# Patient Record
Sex: Male | Born: 1980 | Race: Black or African American | Hispanic: No | Smoking: Never smoker
Health system: Southern US, Community
[De-identification: ages and names within clinical notes are randomized; demographics above are authoritative.]

## PROBLEM LIST (undated history)

## (undated) ENCOUNTER — Emergency Department (HOSPITAL_COMMUNITY): Discharge status: Absent without leave | Payer: Self-pay

## (undated) DIAGNOSIS — M549 Dorsalgia, unspecified: Secondary | ICD-10-CM

## (undated) DIAGNOSIS — K5900 Constipation, unspecified: Secondary | ICD-10-CM

## (undated) DIAGNOSIS — Z8719 Personal history of other diseases of the digestive system: Secondary | ICD-10-CM

## (undated) HISTORY — PX: OTHER SURGICAL HISTORY: SHX169

## (undated) HISTORY — DX: Dorsalgia, unspecified: M54.9

## (undated) HISTORY — PX: HERNIA REPAIR: SHX51

## (undated) HISTORY — DX: Constipation, unspecified: K59.00

---

## 2020-09-16 ENCOUNTER — Other Ambulatory Visit: Payer: Self-pay

## 2020-09-16 ENCOUNTER — Emergency Department (HOSPITAL_COMMUNITY): Payer: Self-pay

## 2020-09-16 ENCOUNTER — Emergency Department (HOSPITAL_COMMUNITY)
Admission: EM | Admit: 2020-09-16 | Discharge: 2020-09-17 | Disposition: A | Discharge status: Home / Self Care | Payer: Self-pay | Attending: Emergency Medicine | Admitting: Emergency Medicine

## 2020-09-16 ENCOUNTER — Encounter (HOSPITAL_COMMUNITY): Payer: Self-pay | Admitting: Emergency Medicine

## 2020-09-16 DIAGNOSIS — Y9366 Activity, soccer: Secondary | ICD-10-CM | POA: Insufficient documentation

## 2020-09-16 DIAGNOSIS — M5442 Lumbago with sciatica, left side: Secondary | ICD-10-CM | POA: Insufficient documentation

## 2020-09-16 IMAGING — CR DG HIP (WITH OR WITHOUT PELVIS) 2-3V*L*
3 series · 3 of 3 positions shown · non-contrast
Comparison: None.

CLINICAL DATA: 39-year-old male with left hip pain.

EXAM:
DG HIP (WITH OR WITHOUT PELVIS) 2-3V LEFT

[pelvis ap]
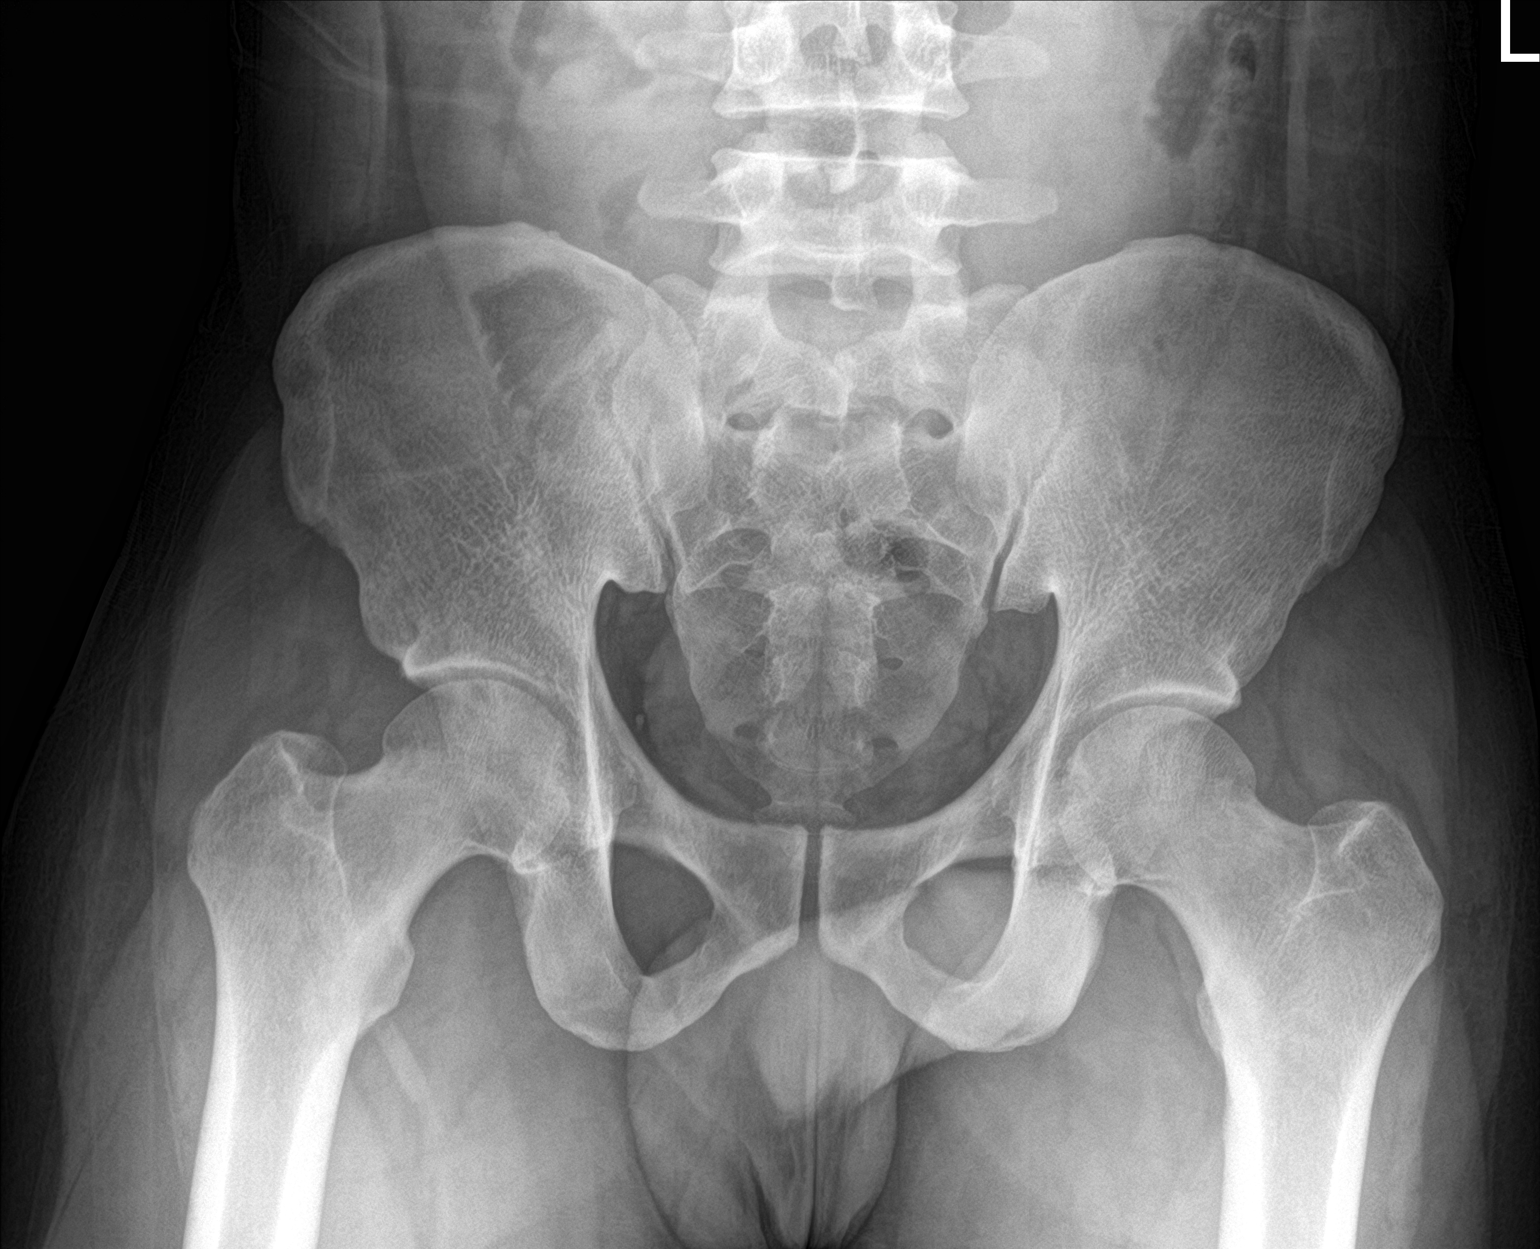

[hip ap]
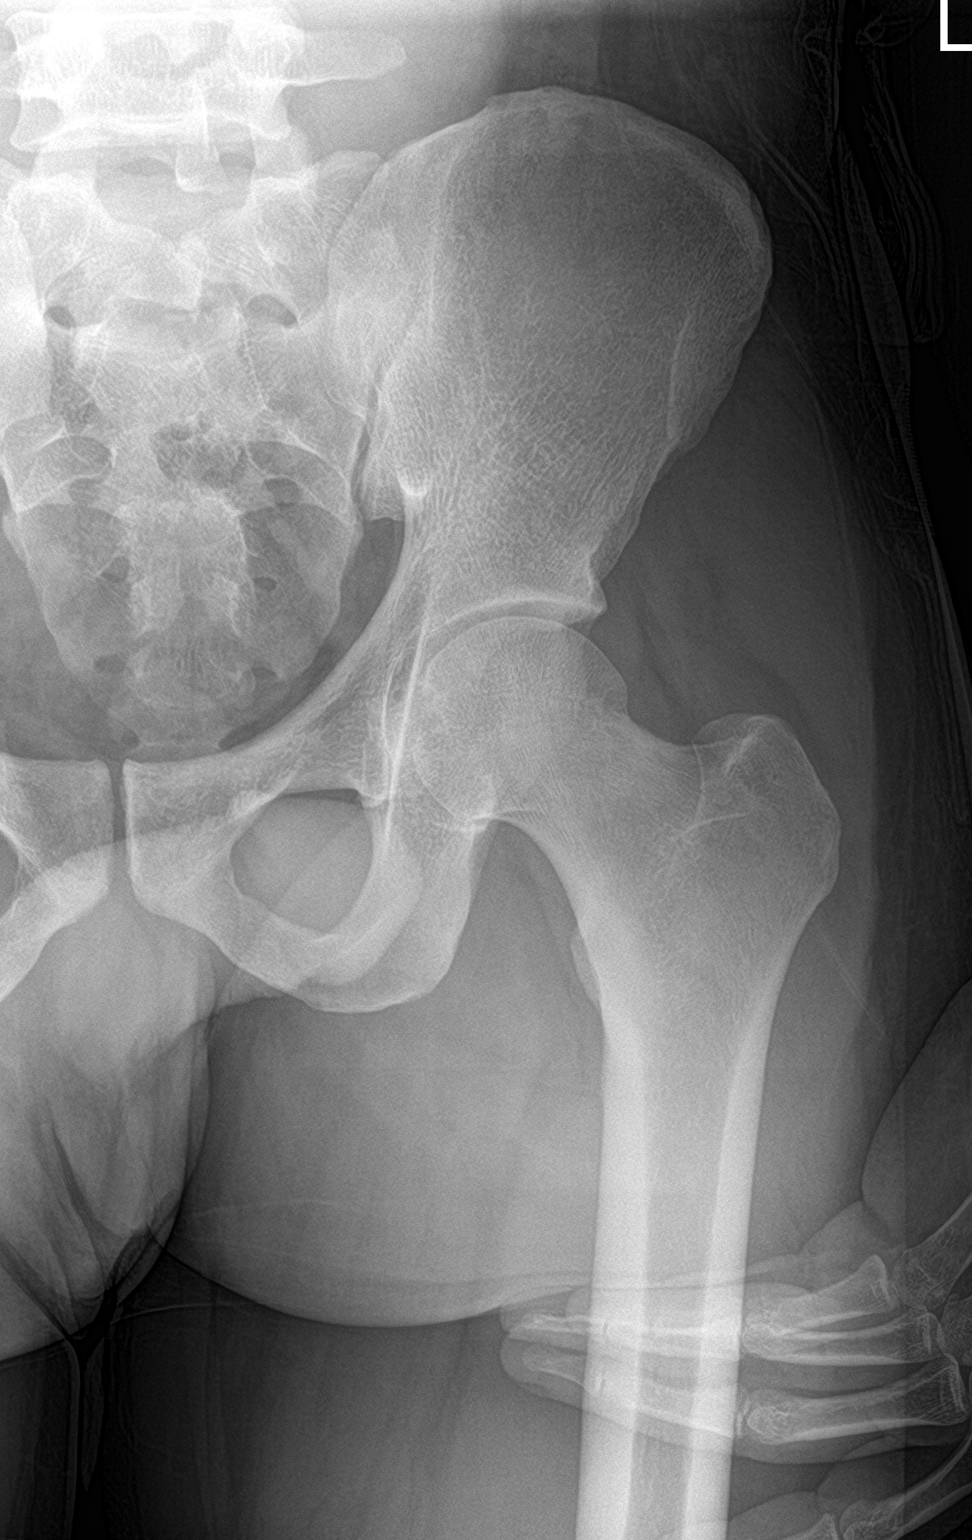

[hip lat]
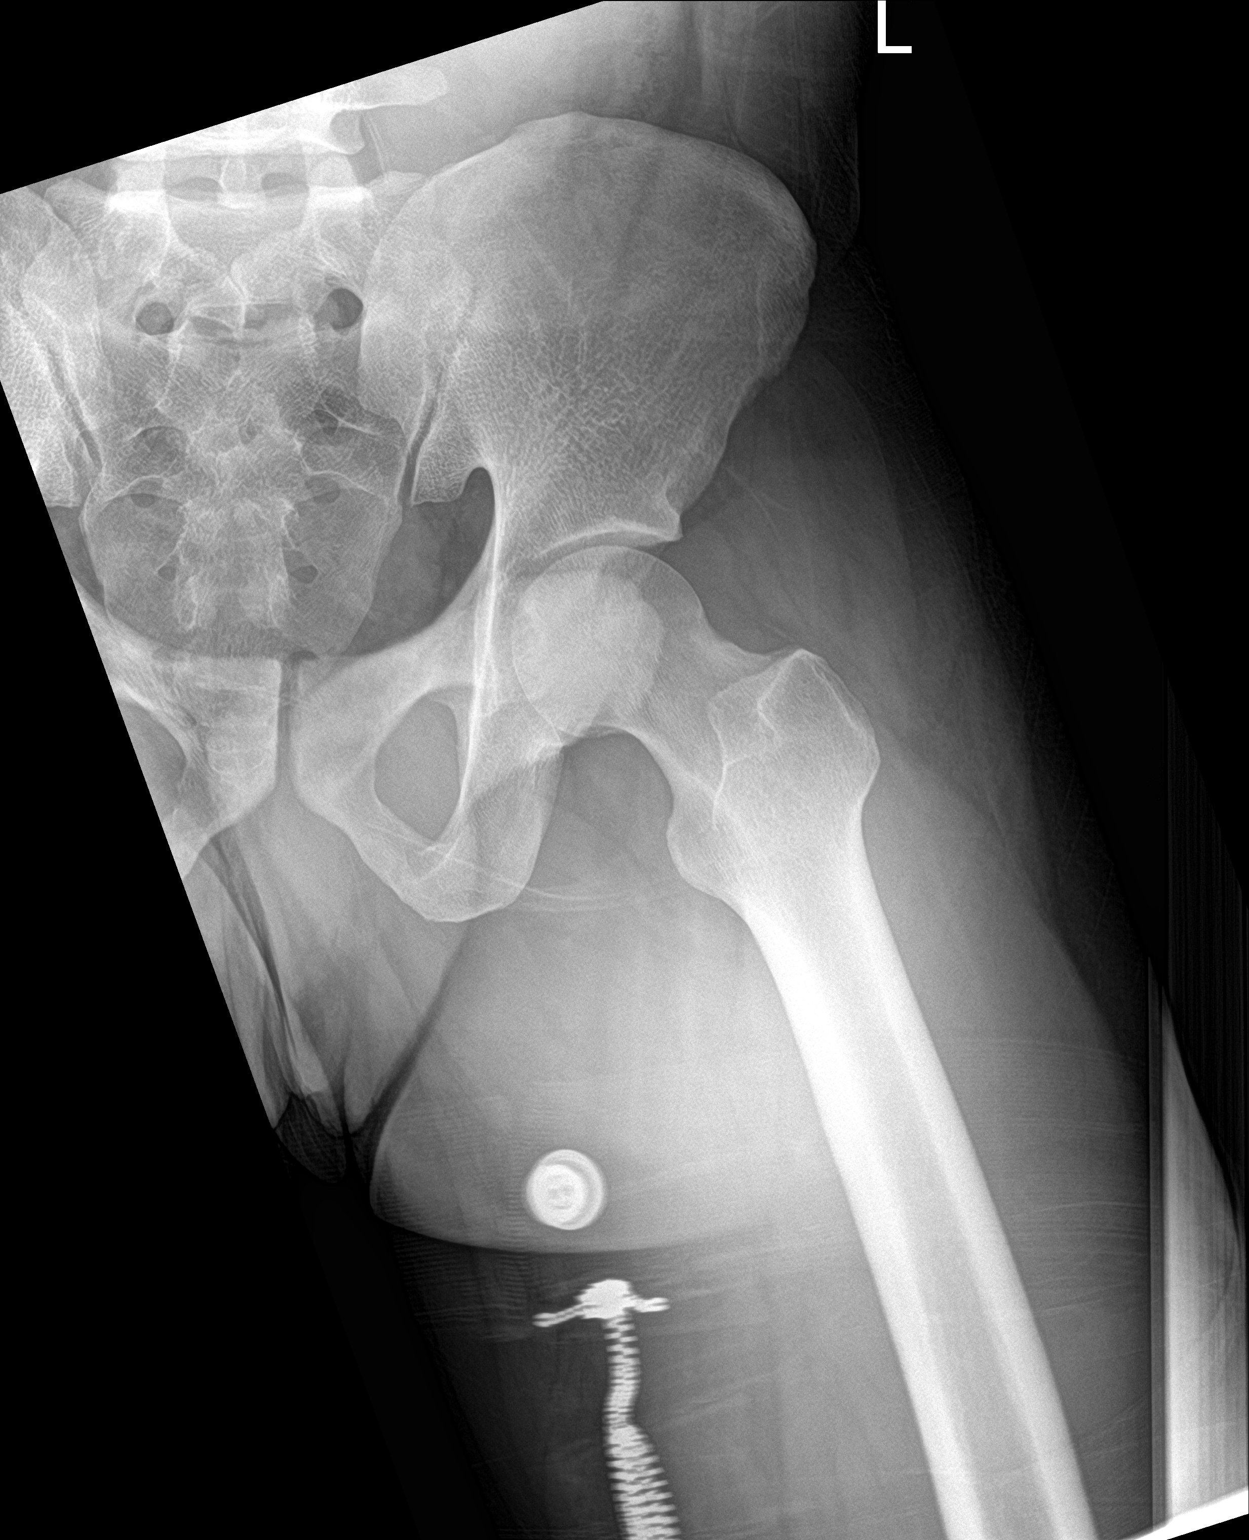

[3 of 3 positions shown; findings below may reference images not displayed]

FINDINGS: There is no evidence of hip fracture or dislocation. There is no
evidence of arthropathy or other focal bone abnormality.
IMPRESSION: Negative.

## 2020-09-16 MED ORDER — ACETAMINOPHEN 500 MG PO TABS
1000.0000 mg | ORAL_TABLET | Freq: Once | ORAL | Status: AC
  Administered 2020-09-16: 1000 mg via ORAL
  Filled 2020-09-16: qty 2

## 2020-09-16 NOTE — ED Triage Notes (Signed)
Triage conducted through french interpretor.  Pt c/o L leg pain after playing soccer January 1st, pt reports pain to L hip since that time, using OTC creams, now having cramping/pain to L leg.

## 2020-09-17 LAB — CK: Total CK: 136 U/L (ref 49–397)

## 2020-09-17 MED ORDER — LIDOCAINE 5 % EX PTCH: 1.0000 | MEDICATED_PATCH | CUTANEOUS | 0 refills | Status: DC

## 2020-09-17 MED ORDER — HYDROCODONE-ACETAMINOPHEN 5-325 MG PO TABS: 1.0000 | ORAL_TABLET | ORAL | 0 refills | Status: DC | PRN

## 2020-09-17 MED ORDER — PREDNISONE 50 MG PO TABS: 50.0000 mg | ORAL_TABLET | Freq: Every day | ORAL | 0 refills | Status: AC

## 2020-09-17 MED ORDER — OXYCODONE-ACETAMINOPHEN 5-325 MG PO TABS
1.0000 | ORAL_TABLET | ORAL | Status: DC | PRN
  Administered 2020-09-17: 1 via ORAL
  Filled 2020-09-17: qty 1

## 2020-09-17 MED ORDER — METHOCARBAMOL 500 MG PO TABS: 500.0000 mg | ORAL_TABLET | Freq: Two times a day (BID) | ORAL | 0 refills | Status: DC

## 2020-09-17 NOTE — ED Provider Notes (Signed)
[facility] EMERGENCY DEPARTMENT Provider Note   CSN: [id number] Arrival date & time: 09/16/20  2222     History Chief Complaint  Patient presents with  . Leg Pain    Isaac Fletcher is a 40 y.o. male with no significant past medical history who presents for evaluation of left lower back pain.  This began 10 days ago.  States pain starts at his left lower back and extends into the lateral portion of his left leg.  Worse with movement, bending and twisting.  States this started after he played soccer.  Has been taking ibuprofen at home without relief.  States he also intermittently gets spasms to his left lower back and into his left gluteal region.  Rates his current pain a 5/10.  He is able to walk.  States intermittently he gets a sharp, stabbing pain that begins in his left gluteal region will get "tingling" which resolves.  He denies any history of IV drug use, bowel or bladder incontinence, saddle paresthesias.  He denies any lateral leg swelling, redness or warmth.  Is not followed by orthopedics.  Denies fever, chills, nausea, vomiting, chest pain, shortness of breath abdominal pain, diarrhea, dysuria.  Denies additional aggravating or relieving factors.  History obtained from patient and past medical records.  No interpreter used.  HPI     History reviewed. No pertinent past medical history.  There are no problems to display for this patient.   History reviewed. No pertinent surgical history.     No family history on file.  Social History   Tobacco Use  . Smoking status: Never Smoker  . Smokeless tobacco: Never Used  Substance Use Topics  . Alcohol use: Never  . Drug use: Never    Home Medications Prior to Admission medications   Medication Sig Start Date End Date Taking? Authorizing Provider  HYDROcodone-acetaminophen (NORCO/VICODIN) 5-325 MG tablet Take 1 tablet by mouth every 4 (four) hours as needed. 09/17/20  Yes Lamontae Ricardo A, [name]   lidocaine (LIDODERM) 5 % Place 1 patch onto the skin daily. Remove & Discard patch within 12 hours or as directed by MD 09/17/20  Yes Virgal Warmuth A, [name]  methocarbamol (ROBAXIN) 500 MG tablet Take 1 tablet (500 mg total) by mouth 2 (two) times daily. 09/17/20  Yes Ules Marsala A, [name]  predniSONE (DELTASONE) 50 MG tablet Take 1 tablet (50 mg total) by mouth daily for 4 days. 09/17/20 09/21/20 Yes Jazmarie Biever A, [name]    Allergies    Patient has no known allergies.  Review of Systems   Review of Systems  Constitutional: Negative.   HENT: Negative.   Respiratory: Negative.   Cardiovascular: Negative.   Gastrointestinal: Negative.   Genitourinary: Negative.   Musculoskeletal: Positive for back pain. Negative for arthralgias, gait problem, joint swelling, myalgias, neck pain and neck stiffness.  Skin: Negative.   Neurological: Negative.   All other systems reviewed and are negative.   Physical Exam Updated Vital Signs BP (!) 126/93 (BP Location: Right Arm)   Pulse 77   Temp 97.8 F (36.6 C) (Oral)   Resp 16   Wt 89 kg   SpO2 100%   Physical Exam Physical Exam  Constitutional: Pt appears well-developed and well-nourished. No distress.  HENT:  Head: Normocephalic and atraumatic.  Mouth/Throat: Oropharynx is clear and moist. No oropharyngeal exudate.  Eyes: Conjunctivae are normal.  Neck: Normal range of motion. Neck supple.  Full ROM without pain  Cardiovascular: Normal rate, regular  rhythm and intact distal pulses.   Pulmonary/Chest: Effort normal and breath sounds normal. No respiratory distress. Pt has no wheezes.  Abdominal: Soft. Pt exhibits no distension. There is no tenderness, rebound or guarding. No abd bruit or pulsatile mass Musculoskeletal:  Full range of motion of the T-spine and L-spine with flexion, hyperextension, and lateral flexion. No midline tenderness or stepoffs. No tenderness to palpation of the spinous processes of the T-spine or  L-spine. Moderate tenderness to palpation of the paraspinous muscles of the L-spine over SI and Piriformis. Compartments soft, Lymphadenopathy:    Pt has no cervical adenopathy.  Neurological: Pt is alert. Pt has normal reflexes.  Reflex Scores:      Bicep reflexes are 2+ on the right side and 2+ on the left side.      Brachioradialis reflexes are 2+ on the right side and 2+ on the left side.      Patellar reflexes are 2+ on the right side and 2+ on the left side.      Achilles reflexes are 2+ on the right side and 2+ on the left side. Speech is clear and goal oriented, follows commands Normal 5/5 strength in upper and lower extremities bilaterally including dorsiflexion and plantar flexion, strong and equal grip strength Sensation normal to light and sharp touch Moves extremities without ataxia, coordination intact Normal gait Normal balance No Clonus Skin: Skin is warm and dry. No rash noted or lesions noted. Pt is not diaphoretic. No erythema, ecchymosis,edema or warmth.  Psychiatric: Pt has a normal mood and affect. Behavior is normal.  Nursing note and vitals reviewed. ED Results / Procedures / Treatments   Labs (all labs ordered are listed, but only abnormal results are displayed) Labs Reviewed  CK    EKG None  Radiology DG Hip Unilat W or Wo Pelvis 2-3 Views Left  Result Date: 09/16/2020 CLINICAL DATA:  40 year old male with left hip pain. EXAM: DG HIP (WITH OR WITHOUT PELVIS) 2-3V LEFT COMPARISON:  None. FINDINGS: There is no evidence of hip fracture or dislocation. There is no evidence of arthropathy or other focal bone abnormality. IMPRESSION: Negative. Electronically Signed   By: Elgie Collard M.D.   On: 09/16/2020 23:30   Procedures Procedures (including critical care time)  Medications Ordered in ED Medications  oxyCODONE-acetaminophen (PERCOCET/ROXICET) 5-325 MG per tablet 1 tablet (1 tablet Oral Given 09/17/20 0132)  acetaminophen (TYLENOL) tablet 1,000 mg  (1,000 mg Oral Given 09/16/20 2310)   ED Course  I have reviewed the triage vital signs and the nursing notes.  Pertinent labs & imaging results that were available during my care of the patient were reviewed by me and considered in my medical decision making (see chart for details).  Unfortunately patient had extended wait time of greater than 11 hours in the lobby.  40 year old presents for evaluation of left lower back pain after playing soccer.  Pain radiates from the left lower back into his gluteal region into the lateral aspect of his right leg.  Terminates at the knee.  Able to reproduce pain on exam.  He has no midline spinal tenderness, step-offs.  Denies history of IV drug use, bowel or bladder incontinence, saddle paresthesia.  No prior history of back problems.  His compartments are soft.  Low suspicion for compartment syndrome.  He has no unilateral leg swelling, redness or warmth.  Low suspicion for VTE.  Did have labs and imaging performed by triage prior to my assessment.  CK level within normal limits,  low suspicion for rhabdomyolysis, I did obtain an x-ray of his left hip which does not show any evidence of fracture or dislocation.  I feel patient symptoms are likely due to sciatica.  At this time I have low suspicion for acute neurosurgical emergency such as cauda equina, discitis, osteomyelitis, transverse myelitis, psoas abscess, intra-abdominal process, vascular occlusion.  Patient's pain was treated from waiting room.  Has had significant improvement.  Ambulatory without difficulty.  DC home with prescriptions.  He will follow-up outpatient with orthopedics.  He is agreeable to this.  The patient has been appropriately medically screened and/or stabilized in the ED. I have low suspicion for any other emergent medical condition which would require further screening, evaluation or treatment in the ED or require inpatient management.  Patient is hemodynamically stable and in no acute  distress.  Patient able to ambulate in department prior to ED.  Evaluation does not show acute pathology that would require ongoing or additional emergent interventions while in the emergency department or further inpatient treatment.  I have discussed the diagnosis with the patient and answered all questions.  Pain is been managed while in the emergency department and patient has no further complaints prior to discharge.  Patient is comfortable with plan discussed in room and is stable for discharge at this time.  I have discussed strict return precautions for returning to the emergency department.  Patient was encouraged to follow-up with PCP/specialist refer to at discharge.    MDM Rules/Calculators/A&P                           Final Clinical Impression(s) / ED Diagnoses Final diagnoses:  Acute left-sided low back pain with left-sided sciatica    Rx / DC Orders ED Discharge Orders         Ordered    HYDROcodone-acetaminophen (NORCO/VICODIN) 5-325 MG tablet  Every 4 hours PRN        09/17/20 1011    methocarbamol (ROBAXIN) 500 MG tablet  2 times daily        09/17/20 1011    lidocaine (LIDODERM) 5 %  Every 24 hours        09/17/20 1011    predniSONE (DELTASONE) 50 MG tablet  Daily        09/17/20 1011           Krystal Teachey A, [name] 09/17/20 1026    Linwood Dibbles, MD 09/18/20 0945

## 2020-09-17 NOTE — ED Notes (Signed)
Pt groaning and grimacing in pain. States no relief with earlier tylenol. Will medicate patient per protocol

## 2020-09-17 NOTE — ED Notes (Signed)
Pt states has numbness and tingling in left hip and leg, but able to walk. Pt states it is painful to walk on left leg.

## 2020-09-17 NOTE — Discharge Instructions (Signed)
Votre douleur est probablement le rsultat d'une maladie appele sciatique.  Je t'ai crit pour les mdicaments : Norco-Pour la douleur, ne conduisez pas et n'utilisez pas de machinerie lourde pendant que vous prenez ce mdicament. Peut devenir addictif.  Robaxin - Pour les spasmes musculaires  prednisone pour l'inflammation.  Suivi avec le mdecin orthopdiste si les symptmes ne sont pas rsolus. Son numro de tlphone est indiqu sur vos documents de dcharge.   Retour au service des urgences pour de nouveaux symptmes ou une aggravation des symptmes

## 2020-09-20 ENCOUNTER — Emergency Department (HOSPITAL_COMMUNITY)
Admission: EM | Admit: 2020-09-20 | Discharge: 2020-09-21 | Disposition: A | Discharge status: Home / Self Care | Payer: Self-pay | Attending: Emergency Medicine | Admitting: Emergency Medicine

## 2020-09-20 ENCOUNTER — Encounter (HOSPITAL_COMMUNITY): Payer: Self-pay

## 2020-09-20 ENCOUNTER — Other Ambulatory Visit: Payer: Self-pay

## 2020-09-20 DIAGNOSIS — M5432 Sciatica, left side: Secondary | ICD-10-CM | POA: Insufficient documentation

## 2020-09-20 DIAGNOSIS — Z5321 Procedure and treatment not carried out due to patient leaving prior to being seen by health care provider: Secondary | ICD-10-CM | POA: Insufficient documentation

## 2020-09-20 NOTE — ED Triage Notes (Signed)
[place] interpreter used for triage: Pt seen here on 1/11 for left sided sciatica pain, given rx in which he is out of, requesting more pain medications, did not call ortho to follow up. Pt ambulatory

## 2020-09-21 NOTE — ED Notes (Addendum)
Pt checked out AMA. 

## 2020-10-07 ENCOUNTER — Ambulatory Visit (INDEPENDENT_AMBULATORY_CARE_PROVIDER_SITE_OTHER): Payer: Self-pay

## 2020-10-07 ENCOUNTER — Other Ambulatory Visit: Payer: Self-pay

## 2020-10-07 ENCOUNTER — Encounter: Payer: Self-pay | Admitting: Orthopaedic Surgery

## 2020-10-07 ENCOUNTER — Ambulatory Visit: Payer: Self-pay | Admitting: Orthopaedic Surgery

## 2020-10-07 DIAGNOSIS — M5442 Lumbago with sciatica, left side: Secondary | ICD-10-CM

## 2020-10-07 MED ORDER — DICLOFENAC SODIUM 75 MG PO TBEC: 75.0000 mg | DELAYED_RELEASE_TABLET | Freq: Two times a day (BID) | ORAL | 1 refills | Status: DC | PRN

## 2020-10-07 MED ORDER — METHYLPREDNISOLONE 4 MG PO TABS: ORAL_TABLET | ORAL | 0 refills | Status: DC

## 2020-10-07 MED ORDER — GABAPENTIN 300 MG PO CAPS: 300.0000 mg | ORAL_CAPSULE | Freq: Every day | ORAL | 1 refills | Status: DC

## 2020-10-07 NOTE — Progress Notes (Signed)
Office Visit Note   Patient: Isaac Fletcher           Date of Birth: May 19, 1981           MRN: [id number] Visit Date: 10/07/2020              Requested by: No referring provider defined for this encounter. PCP: Patient, No Pcp Per   Assessment & Plan: Visit Diagnoses:  1. Acute left-sided low back pain with left-sided sciatica     Plan: What he describes is most likely an inflammatory process and it could be nerve compression or herniated disc.  I would like to try a 6-day steroid taper as well as Neurontin at bedtime and diclofenac twice a day.  Through the interpreter I explained why we will try these medications.  He does not have any health insurance as of now so may be difficult to get him set up for physical therapy or even an MRI.  Given the fact that there is no significant weakness, we should try medical course of treatment first with also trying back extension exercises and reevaluate him in 2 weeks to see how he is doing overall.  He agrees with this treatment plan.  All questions and concerns were answered through the interpreter.  We will reevaluate him in 2 weeks but no x-rays are needed.  Follow-Up Instructions: Return in about 2 weeks (around 10/21/2020).   Orders:  Orders Placed This Encounter  Procedures  . XR Lumbar Spine 2-3 Views   Meds ordered this encounter  Medications  . methylPREDNISolone (MEDROL) 4 MG tablet    Sig: Medrol dose pack. Take as instructed    Dispense:  21 tablet    Refill:  0  . gabapentin (NEURONTIN) 300 MG capsule    Sig: Take 1 capsule (300 mg total) by mouth at bedtime.    Dispense:  30 capsule    Refill:  1  . diclofenac (VOLTAREN) 75 MG EC tablet    Sig: Take 1 tablet (75 mg total) by mouth 2 (two) times daily between meals as needed.    Dispense:  60 tablet    Refill:  1      Procedures: No procedures performed   Clinical Data: No additional findings.   Subjective: Chief Complaint  Patient presents with  . Lower  Back - Follow-up  The patient is a very pleasant 40 year old gentleman who comes in today for evaluation treatment of left-sided low back pain with radicular symptoms going down his left leg.  He is non-English-speaking.  There is a [place] interpreter with him.  This started acutely back in early January after playing soccer.  [place]  He has never had this type of symptoms before.  He was working on conditioning and trying to lose weight.  He says he used to be obese but he definitely appears thin now.  He denies any change in bowel bladder function.  He was seen in the emergency room they obtained x-rays of his pelvis and hip but the spine.  He does report having some chronic constipation.  He said the medications that he was put on for only 4 days helped a little bit.  This looks like this is a narcotic and a muscle relaxant.  He denies any weakness in his legs and does not need an assistive device to walk.  HPI  Review of Systems He does report some numbness in his left leg.  He denies any headache, chest pain, shortness of breath, fever, chills, nausea, vomiting  Objective: Vital Signs: There were no vitals taken for this visit.  Physical Exam He is alert and oriented x3 and in no acute distress Ortho Exam Examination of his bilateral lower extremities shows a mildly positive straight leg raise to the left side.  He has 5 out of 5 strength of all the muscles in his bilateral lower extremities.  There is some pain in the deep thigh and some subjective numbness in the left leg.  There is low back pain to the left side and pain over the trochanteric area to the left side. Specialty Comments:  No specialty comments available.  Imaging: XR Lumbar Spine 2-3 Views  Result Date: 10/07/2020 2 views of the lumbar spine showed no acute findings.  Plain films independently reviewed of the pelvis and left hip show no acute findings.  PMFS History: There are no  problems to display for this patient.  History reviewed. No pertinent past medical history.  History reviewed. No pertinent family history.  History reviewed. No pertinent surgical history. Social History   Occupational History  . Not on file  Tobacco Use  . Smoking status: Never Smoker  . Smokeless tobacco: Never Used  Substance and Sexual Activity  . Alcohol use: Never  . Drug use: Never  . Sexual activity: Not on file

## 2020-10-21 ENCOUNTER — Other Ambulatory Visit: Payer: Self-pay

## 2020-10-21 ENCOUNTER — Ambulatory Visit (INDEPENDENT_AMBULATORY_CARE_PROVIDER_SITE_OTHER): Payer: Self-pay | Admitting: Orthopaedic Surgery

## 2020-10-21 ENCOUNTER — Encounter: Payer: Self-pay | Admitting: Orthopaedic Surgery

## 2020-10-21 DIAGNOSIS — M5442 Lumbago with sciatica, left side: Secondary | ICD-10-CM

## 2020-10-21 DIAGNOSIS — M4807 Spinal stenosis, lumbosacral region: Secondary | ICD-10-CM

## 2020-10-21 MED ORDER — TIZANIDINE HCL 4 MG PO TABS: 4.0000 mg | ORAL_TABLET | Freq: Three times a day (TID) | ORAL | 1 refills | Status: DC | PRN

## 2020-10-21 NOTE — Progress Notes (Signed)
The patient is still complaining of significant left-sided radicular symptoms.  He is seen with a [place] interpreter today.  He is still having numbness and tingling going down the leg on the left side.  We will try diclofenac as well as a steroid taper and Neurontin.  He is having spasms as well and the pain is still waking him at night.  His left leg is numb.  He still has a positive leg raise on the left side as well and subjective numbness in the leg.  This point a MRI is warranted the lumbar spine rule out herniated disc.  I will try some Zanaflex for the spasms she is having and will see him back after the MRI of his lumbar spine.

## 2020-10-31 ENCOUNTER — Telehealth: Payer: Self-pay | Admitting: Orthopaedic Surgery

## 2020-10-31 MED ORDER — TIZANIDINE HCL 4 MG PO TABS: 4.0000 mg | ORAL_TABLET | Freq: Three times a day (TID) | ORAL | 1 refills | Status: DC | PRN

## 2020-10-31 NOTE — Telephone Encounter (Signed)
Patient's brother called. He needs a refill on zanaflex. His call back number is [phone]

## 2020-11-04 ENCOUNTER — Emergency Department (HOSPITAL_COMMUNITY)
Admission: EM | Admit: 2020-11-04 | Discharge: 2020-11-04 | Disposition: A | Discharge status: Home / Self Care | Payer: Self-pay | Attending: Emergency Medicine | Admitting: Emergency Medicine

## 2020-11-04 ENCOUNTER — Encounter (HOSPITAL_COMMUNITY): Payer: Self-pay | Admitting: Emergency Medicine

## 2020-11-04 ENCOUNTER — Other Ambulatory Visit: Payer: Self-pay

## 2020-11-04 ENCOUNTER — Telehealth: Payer: Self-pay

## 2020-11-04 DIAGNOSIS — G8929 Other chronic pain: Secondary | ICD-10-CM | POA: Insufficient documentation

## 2020-11-04 DIAGNOSIS — M5432 Sciatica, left side: Secondary | ICD-10-CM | POA: Insufficient documentation

## 2020-11-04 MED ORDER — PREDNISONE 10 MG (21) PO TBPK: ORAL_TABLET | Freq: Every day | ORAL | 0 refills | Status: DC

## 2020-11-04 MED ORDER — HYDROCODONE-ACETAMINOPHEN 5-325 MG PO TABS: 1.0000 | ORAL_TABLET | ORAL | 0 refills | Status: DC | PRN

## 2020-11-04 MED ORDER — GABAPENTIN 300 MG PO CAPS: 300.0000 mg | ORAL_CAPSULE | Freq: Every day | ORAL | 1 refills | Status: DC

## 2020-11-04 NOTE — Discharge Instructions (Signed)
Please pick up prescriptions and take as prescribed.  You can also pick up SalonPas patches over the counter to help with your pain Continue taking the gabepentin as prescribed Follow up with Dr. Magnus Ivan as scheduled for Thursday  Return to the ED for any worsening symptoms including holding onto urine, peeing or pooping on yourself, numbness in your groin, inability to walk, or any other new/concerning symptoms

## 2020-11-04 NOTE — ED Provider Notes (Signed)
[facility] EMERGENCY DEPARTMENT Provider Note   CSN: [id number] Arrival date & time: 11/04/20  1512     History Chief Complaint  Patient presents with  . Leg Pain    Isaac Fletcher is a 40 y.o. male who presents to the ED today with complaint of ongoing left lower back and left leg pain x 1 month. Pt was initially seen in the ED on 1/11 with negative hip xray. Pt was prescribed pain medication, robaxin, lidoderm patches, and predisone. Pt returned to the ED on 1/14 for worsening pain however appears to have LWBS. He has seen orthopedist Dr. Magnus Ivan x 2, most recently on 2/14 with plans for MRI L spine on 3/15 with concern for possible herniated disc. It appears during pt's first visit with Dr. Magnus Ivan he was prescribed medrol dose pack, gabapentin, and diclofenac. Pt was seen again 2 weeks later on 2/14 and prescribed tizanidine. Pt reports that for the past few days his pain has gotten worse; he called Dr. Eliberto Ivory office today however was told he could only be seen on Thursday and he came to the ED for further evaluation. Pt does not believe he took the methylprednisone as it does not sound familiar to him. He did just get gabapentin refilled today by Dr. Magnus Ivan and states this helps him the most with sleep. Pt states he was told that Dr. Magnus Ivan could prescribe a medication however it was too expensive with him not having insurance and therefore was not prescribed; pt does not know the name of this medication. He denies fevers, chills, urinary retention, urinary or bowel incontinence, saddle anesthesia.   The history is provided by the patient and medical records.       History reviewed. No pertinent past medical history.  There are no problems to display for this patient.   History reviewed. No pertinent surgical history.     History reviewed. No pertinent family history.  Social History   Tobacco Use  . Smoking status: Never Smoker  . Smokeless  tobacco: Never Used  Substance Use Topics  . Alcohol use: Never  . Drug use: Never    Home Medications Prior to Admission medications   Medication Sig Start Date End Date Taking? Authorizing Provider  predniSONE (STERAPRED UNI-PAK 21 TAB) 10 MG (21) TBPK tablet Take by mouth daily. Take 6 tabs by mouth daily  for 1 day, then 5 tabs for 1 day, then 4 tabs for 1 day, then 3 tabs for 1 day, 2 tabs for 1 day, then 1 tab by mouth daily for 1 day 11/04/20  Yes Hyman Hopes, Shalah Estelle, [name]  diclofenac (VOLTAREN) 75 MG EC tablet Take 1 tablet (75 mg total) by mouth 2 (two) times daily between meals as needed. 10/07/20   Kathryne Hitch, MD  gabapentin (NEURONTIN) 300 MG capsule Take 1 capsule (300 mg total) by mouth at bedtime. 11/04/20   Kathryne Hitch, MD  HYDROcodone-acetaminophen (NORCO/VICODIN) 5-325 MG tablet Take 1 tablet by mouth every 4 (four) hours as needed. 11/04/20   Hyman Hopes, Byford Schools, [name]  lidocaine (LIDODERM) 5 % Place 1 patch onto the skin daily. Remove & Discard patch within 12 hours or as directed by MD 09/17/20   Henderly, Britni A, [name]  methocarbamol (ROBAXIN) 500 MG tablet Take 1 tablet (500 mg total) by mouth 2 (two) times daily. 09/17/20   Henderly, Britni A, [name]  methylPREDNISolone (MEDROL) 4 MG tablet Medrol dose pack. Take as instructed 10/07/20   Kathryne Hitch,  MD  tiZANidine (ZANAFLEX) 4 MG tablet Take 1 tablet (4 mg total) by mouth every 8 (eight) hours as needed for muscle spasms. 10/31/20   Kathryne Hitch, MD    Allergies    Patient has no known allergies.  Review of Systems   Review of Systems  Constitutional: Negative for chills and fever.  Musculoskeletal: Positive for arthralgias and back pain.  Neurological: Negative for weakness.  All other systems reviewed and are negative.   Physical Exam Updated Vital Signs BP (!) 129/92 (BP Location: Left Arm)   Pulse 69   Temp 97.9 F (36.6 C)   Resp 18   SpO2 100%   Physical Exam Vitals  and nursing note reviewed.  Constitutional:      Appearance: He is not ill-appearing.  HENT:     Head: Normocephalic and atraumatic.  Eyes:     Conjunctiva/sclera: Conjunctivae normal.  Cardiovascular:     Rate and Rhythm: Normal rate and regular rhythm.     Pulses: Normal pulses.  Pulmonary:     Effort: Pulmonary effort is normal.     Breath sounds: Normal breath sounds. No wheezing, rhonchi or rales.  Musculoskeletal:     Comments: No C, T, or L midline spinal TTP. Mild left paralumbar musculature TTP. Strength equal to LLE with hip flexion, knee extension/flexion, dorsiflexion/plantarflexion. Reflexes intact. + SLR on left. Subjective sensation diminished. 2+ DP pulse.   Skin:    General: Skin is warm and dry.     Coloration: Skin is not jaundiced.  Neurological:     Mental Status: He is alert.     ED Results / Procedures / Treatments   Labs (all labs ordered are listed, but only abnormal results are displayed) Labs Reviewed - No data to display  EKG None  Radiology No results found.  Procedures Procedures   Medications Ordered in ED Medications - No data to display  ED Course  I have reviewed the triage vital signs and the nursing notes.  Pertinent labs & imaging results that were available during my care of the patient were reviewed by me and considered in my medical decision making (see chart for details).    MDM Rules/Calculators/A&P                          40 year old French-speaking male presents the ED today complaining of ongoing/worsening left lower back radiating to left leg pain for the past month.  Being followed by Dr. Magnus Ivan orthopedics with plan for MRI on 3/15.  Was unable to see Dr. Magnus Ivan today and cannot see him until Thursday prompting him to come to the ED today.  He reports his pain is worse prompting him to come back.  He denies any red flag symptoms today concerning for cauda equina, spinal epidural abscess, AAA.  On arrival to the ED  vitals are stable.  Patient is afebrile, nontachycardic nontachypneic and appears to be in no acute distress.  His exam appears unchanged from Dr. Eliberto Ivory previous exam on 1/31.  She did just have gabapentin refilled by Dr. Magnus Ivan today via telephone encounter.  He also has prescriptions for tizanidine diclofenac with him.  It does appear he was prescribed a prednisone pack at one point, patient does not believe he took this medication.  He is also here as he reports that Dr. Magnus Ivan also going to prescribe any specific medication but told him it would be too expensive with his not having insurance,  he does not know the name of the medication.  I am unable to see this in Dr. Eliberto Ivory notes.  It does appear that he was prescribed lidocaine patches during his initial ED visit on 1/11 which would be significantly expensive without insurance, I suspect this may be what he is talking about.  Given his symptoms are ongoing with plan for MRI and no worsening symptoms concerning for cauda equina I do not feel patient needs repeat imaging today or MRI today.  We will plan to discharge patient home with a short course of pain medication, prednisone pack as well as Salonpas patches.  Patient to follow-up with Dr. Magnus Ivan as scheduled on Thursday.  He is in agreement with plan is stable for discharge home.   This note was prepared using Dragon voice recognition software and may include unintentional dictation errors due to the inherent limitations of voice recognition software.  Final Clinical Impression(s) / ED Diagnoses Final diagnoses:  Chronic left-sided low back pain with left-sided sciatica    Rx / DC Orders ED Discharge Orders         Ordered    predniSONE (STERAPRED UNI-PAK 21 TAB) 10 MG (21) TBPK tablet  Daily        11/04/20 1821    HYDROcodone-acetaminophen (NORCO/VICODIN) 5-325 MG tablet  Every 4 hours PRN        11/04/20 1821           Discharge Instructions     Please pick up  prescriptions and take as prescribed.  You can also pick up SalonPas patches over the counter to help with your pain Continue taking the gabepentin as prescribed Follow up with Dr. Magnus Ivan as scheduled for Thursday  Return to the ED for any worsening symptoms including holding onto urine, peeing or pooping on yourself, numbness in your groin, inability to walk, or any other new/concerning symptoms       Tanda Rockers, [name] 11/04/20 Franki Cabot, MD 11/05/20 [phone]

## 2020-11-04 NOTE — ED Triage Notes (Signed)
Pt arrives via POV from ongoing left knee, lower leg and cramping. Pt ambulatory. Seen here recently for same. Good DP pulse.

## 2020-11-04 NOTE — Telephone Encounter (Signed)
Pt would like a refill on his Gabapentin 300 MG pt states that the medication is helping him sleep

## 2020-11-07 ENCOUNTER — Ambulatory Visit (INDEPENDENT_AMBULATORY_CARE_PROVIDER_SITE_OTHER): Payer: Self-pay | Admitting: Physician Assistant

## 2020-11-07 ENCOUNTER — Encounter: Payer: Self-pay | Admitting: Physician Assistant

## 2020-11-07 DIAGNOSIS — M5442 Lumbago with sciatica, left side: Secondary | ICD-10-CM

## 2020-11-07 MED ORDER — HYDROCODONE-ACETAMINOPHEN 5-325 MG PO TABS: 1.0000 | ORAL_TABLET | ORAL | 0 refills | Status: DC | PRN

## 2020-11-07 NOTE — Progress Notes (Signed)
Office Visit Note   Patient: Isaac Fletcher           Date of Birth: Jun 05, 1981           MRN: [id number] Visit Date: 11/07/2020              Requested by: No referring provider defined for this encounter. PCP: Patient, No Pcp Per   Assessment & Plan: Visit Diagnoses:  1. Acute left-sided low back pain with left-sided sciatica     Plan: We will work on getting his MRI of his lumbar spine moved up.  He will follow-up with Dr. Magnus Ivan after the MRI to go over results discuss further treatment.  He will continue the Medrol Dosepak.  Continue his Neurontin.  Questions were encouraged and answered at length today.  Interpreter was present to communicate with the patient today as he speaks [place].  Follow-Up Instructions: Return After MRI.   Orders:  No orders of the defined types were placed in this encounter.  Meds ordered this encounter  Medications  . HYDROcodone-acetaminophen (NORCO/VICODIN) 5-325 MG tablet    Sig: Take 1 tablet by mouth every 4 (four) hours as needed.    Dispense:  10 tablet    Refill:  0      Procedures: No procedures performed   Clinical Data: No additional findings.   Subjective: Chief Complaint  Patient presents with  . Left Leg - Pain    HPI  [name] comes in today due to leg pain radiating down the leg.  Denies any back pain.  He denies any injury.  Pain does not awaken him.  He does state he has burning pain down the left leg numbness the medial side of the lower left leg.  Complaining of left buttocks pain.  Since he was seen by Dr. Magnus Ivan he has been taking the Neurontin.  Over the last few days he has began taking the Medrol Dosepak is not seeing any benefit as of yet.  He also was seen in the ER due to the pain and was again given prednisone Dosepak and hydrocodone for pain.  He was told to continue taking the gabapentin.  And he was to undergo his MRI of his lumbar spine to rule out HNP on 11/19/2020.  Patient seen today with an  interpreter.  Review of Systems  Constitutional: Negative for chills and fever.  Musculoskeletal: Positive for back pain.  Neurological: Positive for numbness.  Denies any bowel or bladder dysfunction, saddle anesthesia like symptoms or waking pain.   Objective: Vital Signs: There were no vitals taken for this visit.  Physical Exam Constitutional:      Appearance: He is diaphoretic. He is not ill-appearing.  Pulmonary:     Effort: Pulmonary effort is normal.  Neurological:     Mental Status: He is alert and oriented to person, place, and time.  Psychiatric:        Mood and Affect: Mood normal.     Ortho Exam Bilateral lower extremities 5 5 strength throughout the lower extremities.  He has a positive straight leg raise on the left.  Tenderness lower lumbar paraspinous region on the left.  Subjective decreased sensation over the medial distal left lower leg.  Dorsal pedal pulses are 2+ bilaterally. Specialty Comments:  No specialty comments available.  Imaging: No results found.   PMFS History: There are no problems to display for this patient.  No past medical history on file.  No family history on file.  No past surgical history on file. Social History   Occupational History  . Not on file  Tobacco Use  . Smoking status: Never Smoker  . Smokeless tobacco: Never Used  Substance and Sexual Activity  . Alcohol use: Never  . Drug use: Never  . Sexual activity: Not on file

## 2020-11-08 ENCOUNTER — Telehealth: Payer: Self-pay

## 2020-11-08 NOTE — Telephone Encounter (Signed)
Patient came into the office regarding mri appointment he is requesting a urgent referral to be sent to [facility] so he can be seen sooner call [phone]

## 2020-11-08 NOTE — Telephone Encounter (Signed)
See below

## 2020-11-11 NOTE — Telephone Encounter (Signed)
Isaac Fletcher with [facility] is working on trying to get pt seen sooner. Due to pt having to have interpeter they have to wait on their interpreter company to be available for sooner appt, the pt can not have familiy member to speak for him.

## 2020-11-12 ENCOUNTER — Other Ambulatory Visit: Payer: Self-pay

## 2020-11-14 ENCOUNTER — Emergency Department (HOSPITAL_COMMUNITY)
Admission: EM | Admit: 2020-11-14 | Discharge: 2020-11-14 | Disposition: A | Discharge status: Home / Self Care | Payer: Self-pay | Attending: Emergency Medicine | Admitting: Emergency Medicine

## 2020-11-14 ENCOUNTER — Encounter (HOSPITAL_COMMUNITY): Payer: Self-pay

## 2020-11-14 ENCOUNTER — Emergency Department (HOSPITAL_COMMUNITY): Payer: Self-pay

## 2020-11-14 DIAGNOSIS — K5900 Constipation, unspecified: Secondary | ICD-10-CM | POA: Insufficient documentation

## 2020-11-14 DIAGNOSIS — R202 Paresthesia of skin: Secondary | ICD-10-CM | POA: Insufficient documentation

## 2020-11-14 DIAGNOSIS — M5442 Lumbago with sciatica, left side: Secondary | ICD-10-CM | POA: Insufficient documentation

## 2020-11-14 DIAGNOSIS — M5126 Other intervertebral disc displacement, lumbar region: Secondary | ICD-10-CM | POA: Insufficient documentation

## 2020-11-14 HISTORY — DX: Personal history of other diseases of the digestive system: Z87.19

## 2020-11-14 IMAGING — MR MR LUMBAR SPINE W/O CM
4 of 5 series · 19 of 48 positions shown · non-contrast
Comparison: Radiographs [DATE]

CLINICAL DATA: Low back pain, cauda equina syndrome suspected.

EXAM:
MRI LUMBAR SPINE WITHOUT CONTRAST
TECHNIQUE: Multiplanar, multisequence MR imaging of the lumbar spine was
performed. No intravenous contrast was administered.

[Series 5: T2 · sagittal · 4.0mm · 0.59mm/px · 6 of 13 slices shown (1 of 2)]
[im 1/13]
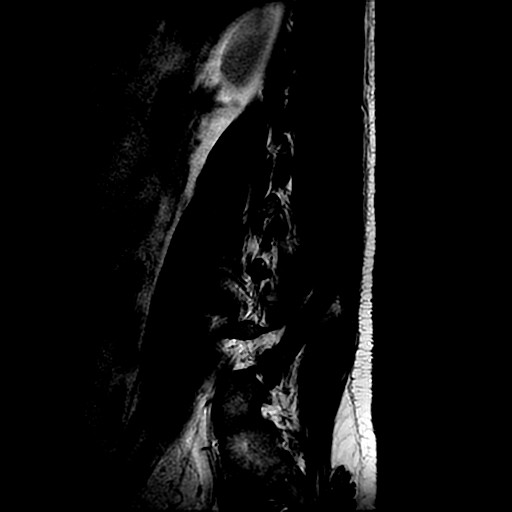
[im 3/13]
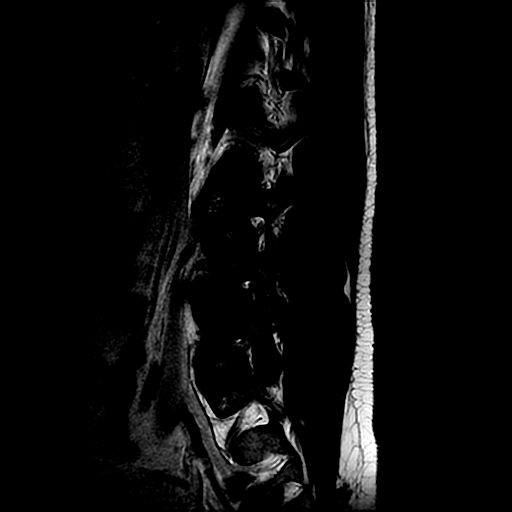
[im 5/13]
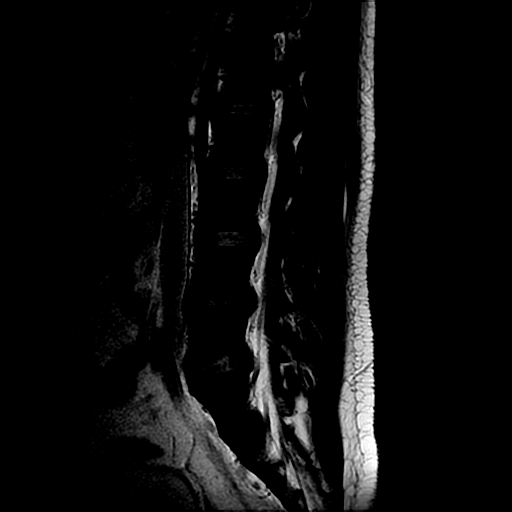
[im 8/13]
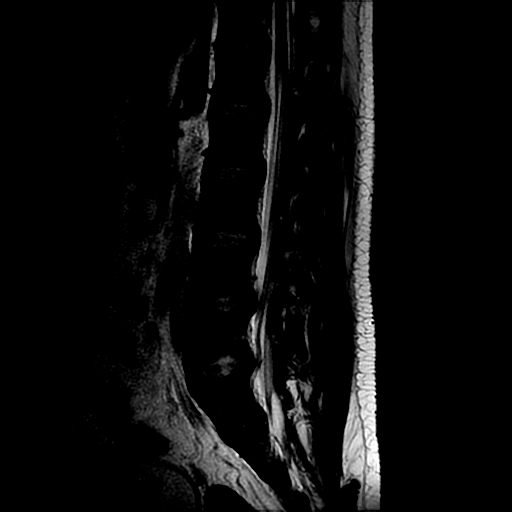
[im 10/13]
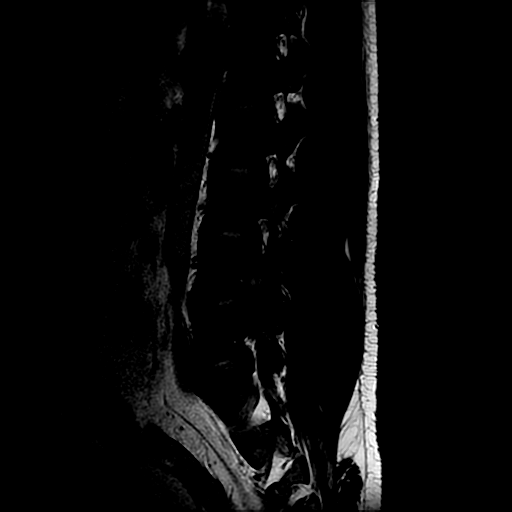
[im 13/13]
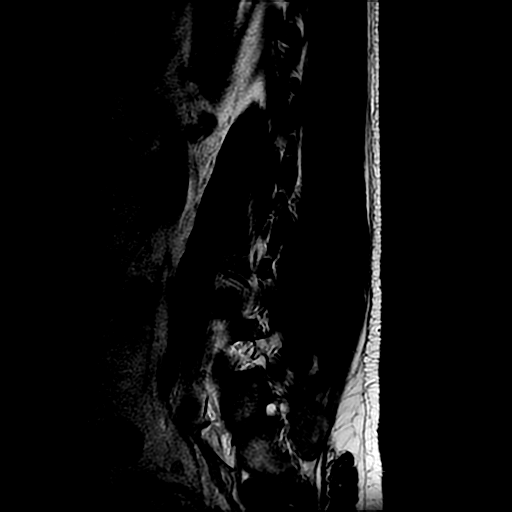

[Series 6: T1 · sagittal · 4.0mm · 0.59mm/px · 3 of 13 slices shown (1 of 2)]
[im 3/13]
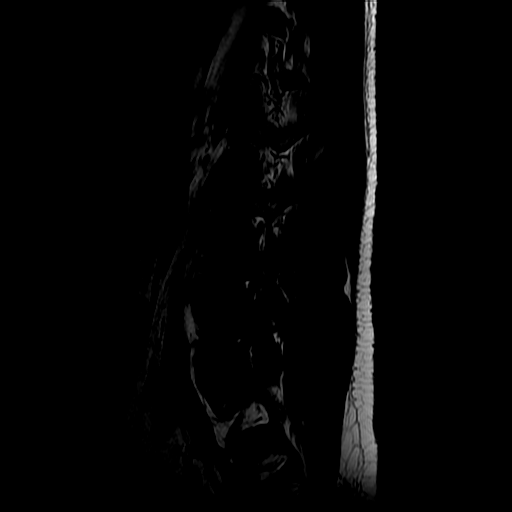
[im 8/13]
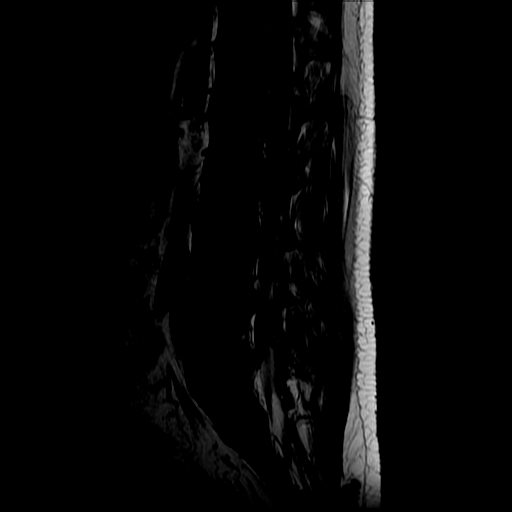
[im 13/13]
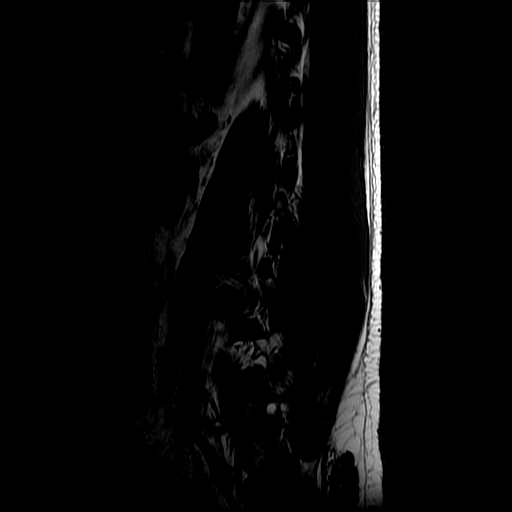

[Series 8: T2 · axial · 4.0mm · 0.41mm/px · z∈[-139,+16]mm · 7 of 35 slices shown (2 of 2)]
[im 1/35]
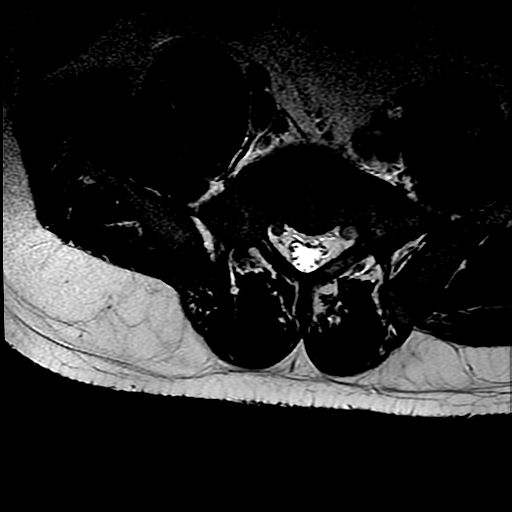
[im 5/35]
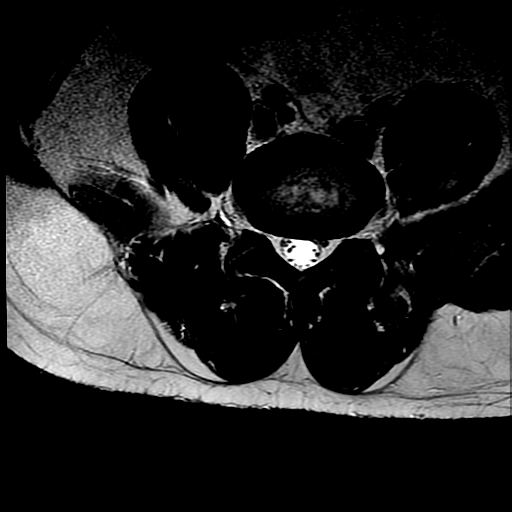
[im 10/35]
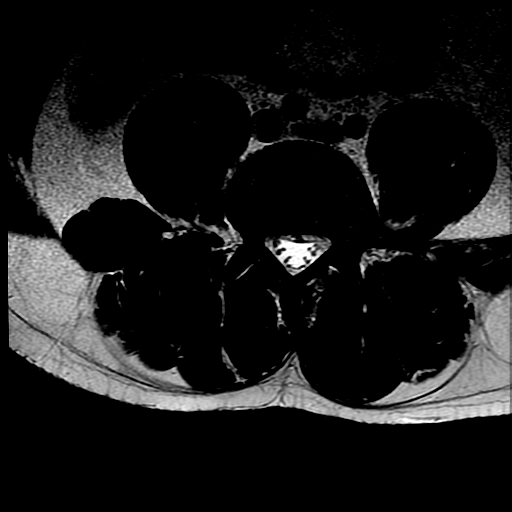
[im 15/35]
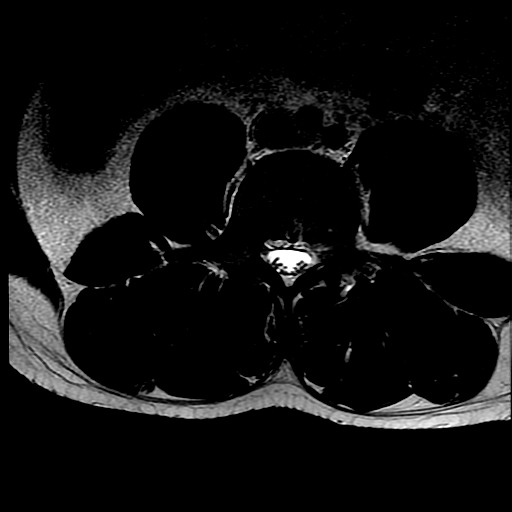
[im 18/35]
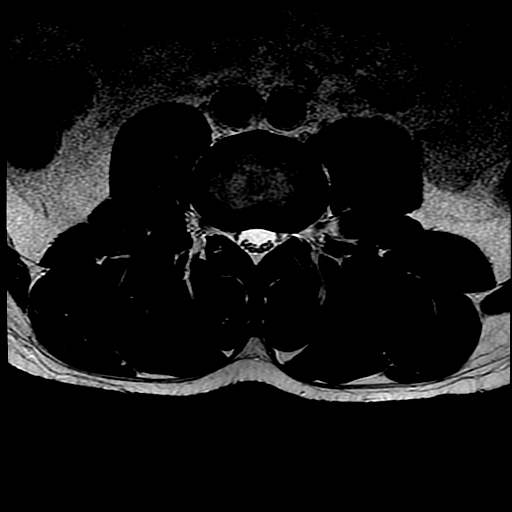
[im 20/35]
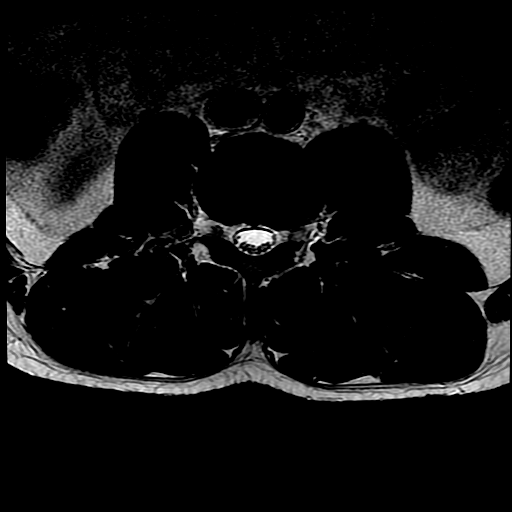
[im 30/35]
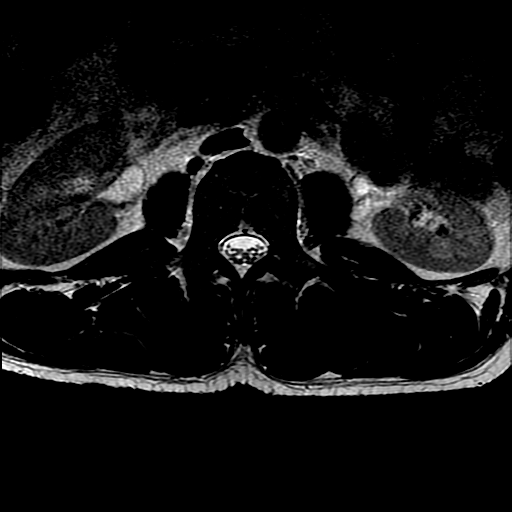

[Series 9: T1 · axial · 4.0mm · 0.41mm/px · z∈[-119,+16]mm · 3 of 35 slices shown (2 of 2)]
[im 5/35]
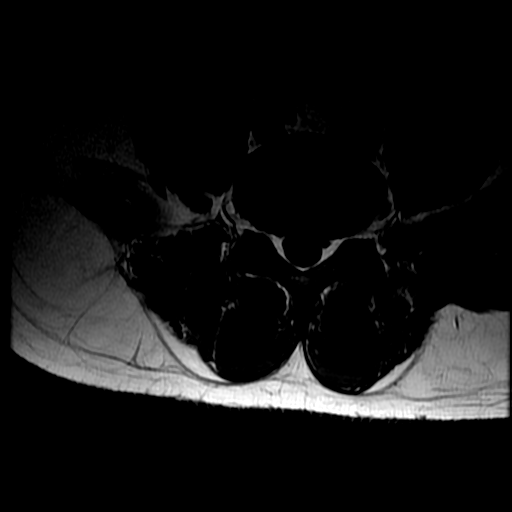
[im 18/35]
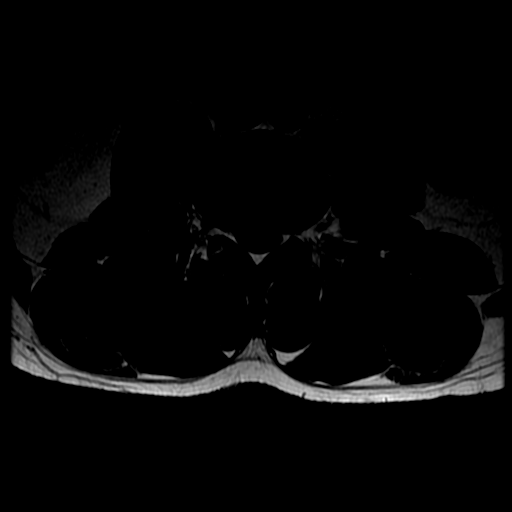
[im 30/35]
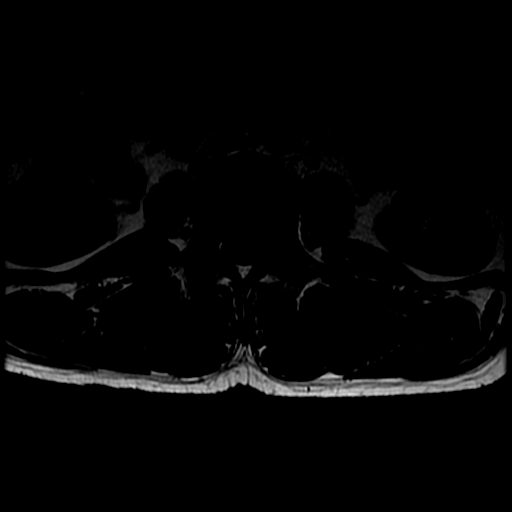

[19 of 48 positions shown; findings below may reference images not displayed]

FINDINGS: Segmentation:  Standard.

Alignment:  Straightening of the lumbar lordosis.

Vertebrae: No fracture, evidence of discitis, or bone lesion.
Congenitally short pedicles.

Conus medullaris and cauda equina: Conus extends to the L2 level.
Conus and cauda equina appear normal.

Paraspinal and other soft tissues: Negative.

Disc levels:

T12-L1:No spinal canal or neural foraminal stenosis.

L1-2:No spinal canal or neural foraminal stenosis.

L2-3:No spinal canal or neural foraminal stenosis.

L3-4:No spinal canal or neural foraminal stenosis.

[name]:Disc bulge with superimposed left foraminal disc protrusion
resulting moderate right and severe left neural foraminal
narrowing.No spinal canal stenosis.

L5-S1:Shallow disc bulge resulting in moderate right neural
foraminal narrowing.No spinal canal stenosis.
IMPRESSION: 1. No acute abnormality of the lumbar spine.
2. Disc bulge with superimposed left foraminal disc protrusion at
L4-L5 resulting in moderate right and severe left neural foraminal
narrowing.
3. Moderate right neural foraminal narrowing at L5-S1.
4. No spinal canal stenosis at any level.

## 2020-11-14 MED ORDER — CYCLOBENZAPRINE HCL 10 MG PO TABS: 10.0000 mg | ORAL_TABLET | Freq: Two times a day (BID) | ORAL | 0 refills | Status: DC | PRN

## 2020-11-14 MED ORDER — HYDROMORPHONE HCL 1 MG/ML IJ SOLN
1.0000 mg | Freq: Once | INTRAMUSCULAR | Status: AC
Start: 2020-11-14 — End: 2020-11-14
  Administered 2020-11-14: 1 mg via INTRAVENOUS
  Filled 2020-11-14: qty 1

## 2020-11-14 MED ORDER — OXYCODONE-ACETAMINOPHEN 5-325 MG PO TABS: 1.0000 | ORAL_TABLET | ORAL | 0 refills | Status: DC | PRN

## 2020-11-14 NOTE — Discharge Instructions (Signed)
Your images today confirmed a herniated disc likely causing her symptoms.  I reviewed the images with your orthopedist, Dr. Magnus Ivan who requests you follow-up with him in clinic this week.  Please call to schedule that appointment.  I also will give you new pain medicine and muscle medicine to help with the symptoms.  Please rest and stay hydrated.  If any symptoms change or worsen, return to the nearest emergency department.

## 2020-11-14 NOTE — ED Provider Notes (Signed)
[facility] Provider Note   CSN: [id number] Arrival date & time: 11/14/20  0732     History Chief Complaint  Patient presents with  . Hip Pain    Cauy E Laramee is a 40 y.o. male.  The history is provided by the patient and medical records. No language interpreter was used.  Back Pain Location:  Sacro-iliac joint and lumbar spine Quality:  Shooting, cramping and aching Radiates to:  L thigh, L foot, L posterior upper leg and L knee Pain severity:  Severe Pain is:  Same all the time Onset quality:  Gradual Duration:  2 months Timing:  Constant Progression:  Worsening (over last 48 hours) Relieved by:  Nothing Worsened by:  Nothing Associated symptoms: numbness, paresthesias and tingling   Associated symptoms: no abdominal pain, no bladder incontinence, no bowel incontinence, no chest pain, no dysuria, no fever, no headaches, no pelvic pain, no perianal numbness, no weakness and no weight loss        Past Medical History:  Diagnosis Date  . H/O hernia repair     There are no problems to display for this patient.   No past surgical history on file.     No family history on file.  Social History   Tobacco Use  . Smoking status: Never Smoker  . Smokeless tobacco: Never Used  Vaping Use  . Vaping Use: Never used  Substance Use Topics  . Alcohol use: Never  . Drug use: Never    Home Medications Prior to Admission medications   Medication Sig Start Date End Date Taking? Authorizing Provider  diclofenac (VOLTAREN) 75 MG EC tablet Take 1 tablet (75 mg total) by mouth 2 (two) times daily between meals as needed. 10/07/20   Kathryne HitchBlackman, Arin Vanosdol Y, MD  gabapentin (NEURONTIN) 300 MG capsule Take 1 capsule (300 mg total) by mouth at bedtime. 11/04/20   Kathryne HitchBlackman, Khamia Stambaugh Y, MD  HYDROcodone-acetaminophen (NORCO/VICODIN) 5-325 MG tablet Take 1 tablet by mouth every 4 (four) hours as needed. 11/07/20   Kirtland Bouchardlark, Gilbert W, [name]   lidocaine (LIDODERM) 5 % Place 1 patch onto the skin daily. Remove & Discard patch within 12 hours or as directed by MD 09/17/20   Henderly, Britni A, [name]  methocarbamol (ROBAXIN) 500 MG tablet Take 1 tablet (500 mg total) by mouth 2 (two) times daily. 09/17/20   Henderly, Britni A, [name]  methylPREDNISolone (MEDROL) 4 MG tablet Medrol dose pack. Take as instructed 10/07/20   Kathryne HitchBlackman, Cordney Barstow Y, MD  predniSONE (STERAPRED UNI-PAK 21 TAB) 10 MG (21) TBPK tablet Take by mouth daily. Take 6 tabs by mouth daily  for 1 day, then 5 tabs for 1 day, then 4 tabs for 1 day, then 3 tabs for 1 day, 2 tabs for 1 day, then 1 tab by mouth daily for 1 day 11/04/20   Hyman HopesVenter, Margaux, [name]  tiZANidine (ZANAFLEX) 4 MG tablet Take 1 tablet (4 mg total) by mouth every 8 (eight) hours as needed for muscle spasms. 10/31/20   Kathryne HitchBlackman, Eriyanna Kofoed Y, MD    Allergies    Patient has no known allergies.  Review of Systems   Review of Systems  Constitutional: Negative for chills, diaphoresis, fatigue, fever and weight loss.  Respiratory: Negative for cough and shortness of breath.   Cardiovascular: Negative for chest pain and palpitations.  Gastrointestinal: Positive for constipation. Negative for abdominal pain, bowel incontinence, diarrhea, nausea and vomiting.  Genitourinary: Negative for bladder incontinence, dysuria, flank pain, frequency and  pelvic pain.  Musculoskeletal: Positive for back pain. Negative for neck pain and neck stiffness.  Neurological: Positive for tingling, numbness and paresthesias. Negative for dizziness, seizures, speech difficulty, weakness, light-headedness and headaches.  Psychiatric/Behavioral: Negative for agitation.  All other systems reviewed and are negative.   Physical Exam Updated Vital Signs BP 140/90 (BP Location: Right Arm)   Pulse 69   Temp 98.5 F (36.9 C) (Oral)   Resp 15   Ht 5' 9.29" (1.76 m)   Wt 90 kg   SpO2 100%   BMI 29.05 kg/m   Physical Exam Vitals and  nursing note reviewed.  Constitutional:      General: He is not in acute distress.    Appearance: He is well-developed. He is not ill-appearing, toxic-appearing or diaphoretic.  HENT:     Head: Normocephalic and atraumatic.     Nose: No congestion or rhinorrhea.  Eyes:     Conjunctiva/sclera: Conjunctivae normal.  Cardiovascular:     Rate and Rhythm: Normal rate and regular rhythm.     Heart sounds: No murmur heard.   Pulmonary:     Effort: Pulmonary effort is normal. No respiratory distress.     Breath sounds: Normal breath sounds. No wheezing, rhonchi or rales.  Chest:     Chest wall: No tenderness.  Abdominal:     General: Abdomen is flat.     Palpations: Abdomen is soft.     Tenderness: There is no abdominal tenderness. There is no right CVA tenderness, left CVA tenderness, guarding or rebound.  Musculoskeletal:        General: Tenderness present. No swelling.     Cervical back: Neck supple. No tenderness.     Right lower leg: No edema.     Left lower leg: No edema.  Skin:    General: Skin is warm and dry.     Capillary Refill: Capillary refill takes less than 2 seconds.     Findings: No erythema.  Neurological:     Mental Status: He is alert.     GCS: GCS eye subscore is 4. GCS verbal subscore is 5. GCS motor subscore is 6.     Cranial Nerves: No cranial nerve deficit or facial asymmetry.     Sensory: Sensory deficit present.     Motor: No weakness or abnormal muscle tone.     Comments: Numbness in left leg compared to right.  Psychiatric:        Mood and Affect: Mood normal.     ED Results / Procedures / Treatments   Labs (all labs ordered are listed, but only abnormal results are displayed) Labs Reviewed - No data to display  EKG None  Radiology MR LUMBAR SPINE WO CONTRAST  Result Date: 11/14/2020 CLINICAL DATA:  Low back pain, cauda equina syndrome suspected. EXAM: MRI LUMBAR SPINE WITHOUT CONTRAST TECHNIQUE: Multiplanar, multisequence MR imaging of the  lumbar spine was performed. No intravenous contrast was administered. COMPARISON:  Radiographs October 07, 2020 FINDINGS: Segmentation:  Standard. Alignment:  Straightening of the lumbar lordosis. Vertebrae: No fracture, evidence of discitis, or bone lesion. Congenitally short pedicles. Conus medullaris and cauda equina: Conus extends to the L2 level. Conus and cauda equina appear normal. Paraspinal and other soft tissues: Negative. Disc levels: T12-L1:No spinal canal or neural foraminal stenosis. L1-2:No spinal canal or neural foraminal stenosis. L2-3:No spinal canal or neural foraminal stenosis. L3-4:No spinal canal or neural foraminal stenosis. [name]:Disc bulge with superimposed left foraminal disc protrusion resulting moderate right and severe left  neural foraminal narrowing.No spinal canal stenosis. L5-S1:Shallow disc bulge resulting in moderate right neural foraminal narrowing.No spinal canal stenosis. IMPRESSION: 1. No acute abnormality of the lumbar spine. 2. Disc bulge with superimposed left foraminal disc protrusion at L4-L5 resulting in moderate right and severe left neural foraminal narrowing. 3. Moderate right neural foraminal narrowing at L5-S1. 4. No spinal canal stenosis at any level. Electronically Signed   By: Baldemar Lenis M.D.   On: 11/14/2020 10:40    Procedures Procedures   Medications Ordered in ED Medications  HYDROmorphone (DILAUDID) injection 1 mg (1 mg Intravenous Given 11/14/20 1313)    ED Course  I have reviewed the triage vital signs and the nursing notes.  Pertinent labs & imaging results that were available during my care of the patient were reviewed by me and considered in my medical decision making (see chart for details).    MDM Rules/Calculators/A&P                          Rameen E Bazar is a pleasant french speaking 40 y.o. male with no significant past medical history who presents with acute worsening of left low back pain with associated  sciatica and worsening numbness now.  Patient reports that for the last few months, he has been dealing with left-sided low back pain that seem to of started while playing soccer.  He says that he has been seen several times in the emergency department and is even followed up with orthopedics.  He says that he is taken all the steroids and pain medicine and muscle medications he was given.  Due to the worsening pain and his orthopedic evaluation, he is scheduled to get MRI next week however he says over the last day or 2, his pain has continued to worsen and is now 10 out of 10 at all times and he is also feeling more numbness in his left leg.  He reports he chronically has constipation and this does not seem to be different but he denies any incontinence of urine.  He reports he is having some difficulty with walking but will not describe weakness.  He is more concerned that the numbness that has been worsening over the last day or 2.  He denies any other complaint specifically no right-sided symptoms.  He denies any headache, blurry vision, difficulty speaking, fevers, chills, chest pain or shortness of breath, nausea, vomiting, or arm symptoms.  No other neurologic complaints.  A [place] interpreter was utilized for all conversation.  On exam, lungs clear chest nontender.  Abdomen nontender.  Back was tender in the left low back.  There did appear to be some palpable muscle spasms on exam.  Patient had symmetric strength but reported numbness in the left side compared to the right.  Symmetric pulses.  Cap refill normal.  Normal strength and sensation in upper extremities.  Exam otherwise unremarkable.  Due to the progressive nature of his pain and the worsening numbness, I do feel appropriate to get MRI today to rule out development of cauda equina.  We will get MRI and will give him pain medicine initially.  Anticipate reassessment after work-up.   12:18 PM MRI returned showing no fracture dislocation  but does show a disc bulge with left foraminal disc protrusion at the L4/5 resulting in moderate right and severe left neural foramen narrowing.  No spinal canal stenosis seen.  No other acute abnormalities the lumbar spine.  We  will call his orthopedics team who requested he get evaluated.  Spoke with Dr. Magnus Ivan, the patient's orthopedist.  He feels the patient stable for discharge home to see him in clinic in the next several days.  They will discuss further management including possible injections.  Patient agreed with new pain medicine and muscle relaxant and he will stop the other medications.  He understands return precautions and follow-up instructions.  Patient discharged in good condition.   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 Acute left-sided low back pain with left-sided sciatica   2. 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 He/she/they expressed understanding and agree(s) with the plan. Discharge instructions discussed at great length. Strict return precautions discussed and pt &/or family have verbalized understanding of the instructions. No further questions at time of discharge.    Discharge Medication List as of 11/14/2020  2:52 PM    START taking these medications   Details  cyclobenzaprine (FLEXERIL) 10 MG tablet Take 1 tablet (10 mg total) by mouth 2 (two) times daily as needed for muscle spasms., Starting Thu 11/14/2020,  Normal    oxyCODONE-acetaminophen (PERCOCET/ROXICET) 5-325 MG tablet Take 1 tablet by mouth every 4 (four) hours as needed., Starting Thu 11/14/2020, Normal        Follow Up: Kathryne Hitch, MD [address] [place] [address] [phone]     [facility] EMERGENCY DEPARTMENT [address] [id number] mc [place] [address] [phone]       Kai Calico, Canary Brim, MD 11/14/20 1655

## 2020-11-14 NOTE — ED Triage Notes (Signed)
Left hip pain x 2 months. Dx with sciatica and scheduled for MRI on 3/17. Reports worsening pain that pt is unable to sleep at night. Observed limping but on a steady gait on ambulation. Denies falls/injury.

## 2020-11-14 NOTE — ED Notes (Signed)
Pt transported to MRI 

## 2020-11-19 ENCOUNTER — Other Ambulatory Visit: Payer: Self-pay

## 2020-11-21 ENCOUNTER — Other Ambulatory Visit: Payer: Self-pay | Admitting: Physician Assistant

## 2020-11-21 ENCOUNTER — Ambulatory Visit (INDEPENDENT_AMBULATORY_CARE_PROVIDER_SITE_OTHER): Payer: Self-pay | Admitting: Physician Assistant

## 2020-11-21 ENCOUNTER — Other Ambulatory Visit: Payer: Self-pay

## 2020-11-21 ENCOUNTER — Telehealth: Payer: Self-pay | Admitting: Physician Assistant

## 2020-11-21 ENCOUNTER — Encounter: Payer: Self-pay | Admitting: Physician Assistant

## 2020-11-21 ENCOUNTER — Telehealth: Payer: Self-pay

## 2020-11-21 DIAGNOSIS — M5442 Lumbago with sciatica, left side: Secondary | ICD-10-CM

## 2020-11-21 MED ORDER — HYDROCODONE-ACETAMINOPHEN 5-325 MG PO TABS
1.0000 | ORAL_TABLET | ORAL | 0 refills | Status: DC | PRN
Start: 2020-11-21 — End: 2021-02-12

## 2020-11-21 MED ORDER — CYCLOBENZAPRINE HCL 10 MG PO TABS: 10.0000 mg | ORAL_TABLET | Freq: Two times a day (BID) | ORAL | 0 refills | Status: DC | PRN

## 2020-11-21 MED ORDER — HYDROCODONE-ACETAMINOPHEN 5-325 MG PO TABS: 1.0000 | ORAL_TABLET | ORAL | 0 refills | Status: DC | PRN

## 2020-11-21 NOTE — Telephone Encounter (Signed)
Double message. Message already sent to gil

## 2020-11-21 NOTE — Telephone Encounter (Signed)
Pt would like the hydrocodone sent to [facility] on [address].

## 2020-11-21 NOTE — Telephone Encounter (Signed)
Spoke with patient and he advised the Rx's should have been sent to [facility] on [address] and [address]. The number to contact patient is [phone]

## 2020-11-21 NOTE — Progress Notes (Signed)
Office Visit Note   Patient: Isaac Fletcher           Date of Birth: 1981/07/17           MRN: [id number] Visit Date: 11/21/2020              Requested by: No referring provider defined for this encounter. PCP: Patient, No Pcp Per   Assessment & Plan: Visit Diagnoses:  1. Acute left-sided low back pain with left-sided sciatica     Plan: We will send him for an epidural steroid injection on the left at [name].  Have him follow-up with [place] 4 weeks after the injection to see what type of response he had to this.  Did refill his pain medication and his  muscle relaxant today.  Questions were encouraged and answered at length.  An interpreter was used today to communicate with patient.  Follow-Up Instructions: Return 4 weeks after ESI.   Orders:  No orders of the defined types were placed in this encounter.  Meds ordered this encounter  Medications  . cyclobenzaprine (FLEXERIL) 10 MG tablet    Sig: Take 1 tablet (10 mg total) by mouth 2 (two) times daily as needed for muscle spasms.    Dispense:  20 tablet    Refill:  0  . HYDROcodone-acetaminophen (NORCO/VICODIN) 5-325 MG tablet    Sig: Take 1 tablet by mouth every 4 (four) hours as needed.    Dispense:  10 tablet    Refill:  0      Procedures: No procedures performed   Clinical Data: No additional findings.   Subjective: Chief Complaint  Patient presents with  . Lower Back - Pain    HPI Isaac Fletcher is 40 year old male who returns today for follow-up after undergoing an MRI of his lumbar spine.  An interpreter is present with him today due to the fact he speaks [place].  He states that he continues to have low back pain with radicular symptoms down the left leg tingles into his foot.  He has no symptoms on the right.  He has been seen in the ER since he was last in our office and they gave him muscle relaxants and some pain medication which was oxycodone and that helps some with his pain.  He is now out  of medications.  Otherwise his condition is unchanged.  MRI dated 11/14/2020 images are reviewed with the patient and his spine model was used for further visualization.  Purposes.  MRI shows a disc bulge with protrusion resulting in severe left foraminal stenosis.  This bulge at [name] also results in moderate right foraminal narrowing.  Moderate right foraminal narrowing seen at L5-S1.Spinal canal stenosis seen throughout the lumbar spine.  Review of Systems See HPI  Objective: Vital Signs: There were no vitals taken for this visit.  Physical Exam Constitutional:      Appearance: He is not ill-appearing or diaphoretic.  Neurological:     Mental Status: He is alert.  Psychiatric:        Mood and Affect: Mood normal.     Ortho Exam Lower extremities 5 out of 5 strength throughout against resistance.  Positive straight leg raise on the left negative on the right. Specialty Comments:  No specialty comments available.  Imaging: No results found.   PMFS History: There are no problems to display for this patient.  Past Medical History:  Diagnosis Date  . H/O hernia repair  History reviewed. No pertinent family history.  History reviewed. No pertinent surgical history. Social History   Occupational History  . Not on file  Tobacco Use  . Smoking status: Never Smoker  . Smokeless tobacco: Never Used  Vaping Use  . Vaping Use: Never used  Substance and Sexual Activity  . Alcohol use: Never  . Drug use: Never  . Sexual activity: Not on file            ```

## 2020-11-21 NOTE — Telephone Encounter (Signed)
Isaac Fletcher is out of hydrocodone. Please resend rx to the pharmacy above

## 2020-11-25 ENCOUNTER — Other Ambulatory Visit: Payer: Self-pay | Admitting: Orthopaedic Surgery

## 2020-11-25 ENCOUNTER — Ambulatory Visit: Payer: Self-pay | Admitting: Orthopaedic Surgery

## 2020-12-02 ENCOUNTER — Other Ambulatory Visit: Payer: Self-pay

## 2020-12-05 ENCOUNTER — Ambulatory Visit (INDEPENDENT_AMBULATORY_CARE_PROVIDER_SITE_OTHER): Payer: Self-pay | Admitting: Orthopaedic Surgery

## 2020-12-05 ENCOUNTER — Encounter: Payer: Self-pay | Admitting: Orthopaedic Surgery

## 2020-12-05 ENCOUNTER — Other Ambulatory Visit: Payer: Self-pay

## 2020-12-05 DIAGNOSIS — M545 Low back pain, unspecified: Secondary | ICD-10-CM

## 2020-12-05 DIAGNOSIS — G8929 Other chronic pain: Secondary | ICD-10-CM

## 2020-12-05 NOTE — Progress Notes (Signed)
The patient was here today due to communication difficulties.  He is supposed to be set up for an epidural steroid injection to the left side at L4-L5 due to symptomatic disc protrusion at that level.  He is non-English-speaking.  He does speak [place].  Interpreter is not with him today.  He needs to be set up for the epidural steroid injection with either Dr. Alvester Morin milligrams for imaging depending on who we can get him to in with a translator.  We will work on making that happen.  This is a no charge visit today.

## 2020-12-19 ENCOUNTER — Ambulatory Visit: Payer: Self-pay | Admitting: Physical Medicine and Rehabilitation

## 2020-12-23 ENCOUNTER — Encounter: Payer: Self-pay | Admitting: Physical Medicine and Rehabilitation

## 2020-12-23 ENCOUNTER — Ambulatory Visit: Payer: Self-pay

## 2020-12-23 ENCOUNTER — Other Ambulatory Visit: Payer: Self-pay

## 2020-12-23 ENCOUNTER — Ambulatory Visit (INDEPENDENT_AMBULATORY_CARE_PROVIDER_SITE_OTHER): Payer: Self-pay | Admitting: Physical Medicine and Rehabilitation

## 2020-12-23 VITALS — BP 131/87 | HR 105

## 2020-12-23 DIAGNOSIS — M5416 Radiculopathy, lumbar region: Secondary | ICD-10-CM

## 2020-12-23 MED ORDER — METHYLPREDNISOLONE ACETATE 80 MG/ML IJ SUSP
80.0000 mg | Freq: Once | INTRAMUSCULAR | Status: AC
Start: 2020-12-23 — End: 2020-12-23
  Administered 2020-12-23: 80 mg

## 2020-12-23 NOTE — Patient Instructions (Signed)

## 2020-12-23 NOTE — Procedures (Signed)
Lumbosacral Transforaminal Epidural Steroid Injection - Sub-Pedicular Approach with Fluoroscopic Guidance  Patient: Isaac Fletcher      Date of Birth: June 04, 1981 MRN: [id number] PCP: Patient, No Pcp Per (Inactive)      Visit Date: 12/23/2020   Universal Protocol:    Date/Time: 12/23/2020  Consent Given By: the patient  Position: PRONE  Additional Comments: Vital signs were monitored before and after the procedure. Patient was prepped and draped in the usual sterile fashion. The correct patient, procedure, and site was verified.   Injection Procedure Details:   Procedure diagnoses: Lumbar radiculopathy [M54.16]    Meds Administered:  Meds ordered this encounter  Medications  . methylPREDNISolone acetate (DEPO-MEDROL) injection 80 mg    Laterality: Left  Location/Site:  L4-L5  Needle:5.0 in., 22 ga.  Short bevel or Quincke spinal needle  Needle Placement: Transforaminal  Findings:    -Comments: Excellent flow of contrast along the nerve, nerve root and into the epidural space.  Procedure Details: After squaring off the end-plates to get a true AP view, the C-arm was positioned so that an oblique view of the foramen as noted above was visualized. The target area is just inferior to the "nose of the scotty dog" or sub pedicular. The soft tissues overlying this structure were infiltrated with 2-3 ml. of 1% Lidocaine without Epinephrine.  The spinal needle was inserted toward the target using a "trajectory" view along the fluoroscope beam.  Under AP and lateral visualization, the needle was advanced so it did not puncture dura and was located close the 6 O'Clock position of the pedical in AP tracterory. Biplanar projections were used to confirm position. Aspiration was confirmed to be negative for CSF and/or blood. A 1-2 ml. volume of Isovue-250 was injected and flow of contrast was noted at each level. Radiographs were obtained for documentation purposes.   After attaining  the desired flow of contrast documented above, a 0.5 to 1.0 ml test dose of 0.25% Marcaine was injected into each respective transforaminal space.  The patient was observed for 90 seconds post injection.  After no sensory deficits were reported, and normal lower extremity motor function was noted,   the above injectate was administered so that equal amounts of the injectate were placed at each foramen (level) into the transforaminal epidural space.   Additional Comments:  The patient tolerated the procedure well Dressing: 2 x 2 sterile gauze and Band-Aid    Post-procedure details: Patient was observed during the procedure. Post-procedure instructions were reviewed.  Patient left the clinic in stable condition.

## 2020-12-23 NOTE — Progress Notes (Signed)
LANDY DUNNAVANT - 40 y.o. male MRN [id number]  Date of birth: Mar 22, 1981  Office Visit Note: Visit Date: 12/23/2020 PCP: Patient, No Pcp Per (Inactive) Referred by: Kathryne Hitch*  Subjective: Chief Complaint  Patient presents with  . Left Hip - Pain  . Left Leg - Pain   HPI:  Atari E Amirault is a 40 y.o. male who comes in today at the request of Dr. Doneen Poisson for planned Left L4-L5 Lumbar epidural steroid injection with fluoroscopic guidance.  The patient has failed conservative care including home exercise, medications, time and activity modification.  This injection will be diagnostic and hopefully therapeutic.  Please see requesting physician notes for further details and justification. MRI reviewed with images and spine model.  MRI reviewed in the note below.  Interpreter present.   ROS Otherwise per HPI.  Assessment & Plan: Visit Diagnoses:    ICD-10-CM   1. Lumbar radiculopathy  M54.16 XR C-ARM NO REPORT    Epidural Steroid injection    methylPREDNISolone acetate (DEPO-MEDROL) injection 80 mg    Plan: No additional findings.   Meds & Orders:  Meds ordered this encounter  Medications  . methylPREDNISolone acetate (DEPO-MEDROL) injection 80 mg    Orders Placed This Encounter  Procedures  . XR C-ARM NO REPORT  . Epidural Steroid injection    Follow-up: Return if symptoms worsen or fail to improve.   Procedures: No procedures performed  Lumbosacral Transforaminal Epidural Steroid Injection - Sub-Pedicular Approach with Fluoroscopic Guidance  Patient: RAHM MINIX      Date of Birth: 11-08-1980 MRN: [id number] PCP: Patient, No Pcp Per (Inactive)      Visit Date: 12/23/2020   Universal Protocol:    Date/Time: 12/23/2020  Consent Given By: the patient  Position: PRONE  Additional Comments: Vital signs were monitored before and after the procedure. Patient was prepped and draped in the usual sterile fashion. The correct patient,  procedure, and site was verified.   Injection Procedure Details:   Procedure diagnoses: Lumbar radiculopathy [M54.16]    Meds Administered:  Meds ordered this encounter  Medications  . methylPREDNISolone acetate (DEPO-MEDROL) injection 80 mg    Laterality: Left  Location/Site:  L4-L5  Needle:5.0 in., 22 ga.  Short bevel or Quincke spinal needle  Needle Placement: Transforaminal  Findings:    -Comments: Excellent flow of contrast along the nerve, nerve root and into the epidural space.  Procedure Details: After squaring off the end-plates to get a true AP view, the C-arm was positioned so that an oblique view of the foramen as noted above was visualized. The target area is just inferior to the "nose of the scotty dog" or sub pedicular. The soft tissues overlying this structure were infiltrated with 2-3 ml. of 1% Lidocaine without Epinephrine.  The spinal needle was inserted toward the target using a "trajectory" view along the fluoroscope beam.  Under AP and lateral visualization, the needle was advanced so it did not puncture dura and was located close the 6 O'Clock position of the pedical in AP tracterory. Biplanar projections were used to confirm position. Aspiration was confirmed to be negative for CSF and/or blood. A 1-2 ml. volume of Isovue-250 was injected and flow of contrast was noted at each level. Radiographs were obtained for documentation purposes.   After attaining the desired flow of contrast documented above, a 0.5 to 1.0 ml test dose of 0.25% Marcaine was injected into each respective transforaminal space.  The patient was observed for 90 seconds  post injection.  After no sensory deficits were reported, and normal lower extremity motor function was noted,   the above injectate was administered so that equal amounts of the injectate were placed at each foramen (level) into the transforaminal epidural space.   Additional Comments:  The patient tolerated the procedure  well Dressing: 2 x 2 sterile gauze and Band-Aid    Post-procedure details: Patient was observed during the procedure. Post-procedure instructions were reviewed.  Patient left the clinic in stable condition.      Clinical History: No specialty comments available.     Objective:  VS:  HT:    WT:   BMI:     BP:131/87  HR:(!) 105bpm  TEMP: ( )  RESP:  Physical Exam Vitals and nursing note reviewed.  Constitutional:      General: He is not in acute distress.    Appearance: Normal appearance. He is not ill-appearing.  HENT:     Head: Normocephalic and atraumatic.     Right Ear: External ear normal.     Left Ear: External ear normal.     Nose: No congestion.  Eyes:     Extraocular Movements: Extraocular movements intact.  Cardiovascular:     Rate and Rhythm: Normal rate.     Pulses: Normal pulses.  Pulmonary:     Effort: Pulmonary effort is normal. No respiratory distress.  Abdominal:     General: There is no distension.     Palpations: Abdomen is soft.  Musculoskeletal:        General: No tenderness or signs of injury.     Cervical back: Neck supple.     Right lower leg: No edema.     Left lower leg: No edema.     Comments: Patient has good distal strength without clonus.  Skin:    Findings: No erythema or rash.  Neurological:     General: No focal deficit present.     Mental Status: He is alert and oriented to person, place, and time.     Sensory: No sensory deficit.     Motor: No weakness or abnormal muscle tone.     Coordination: Coordination normal.  Psychiatric:        Mood and Affect: Mood normal.        Behavior: Behavior normal.      Imaging: No results found.

## 2020-12-23 NOTE — Progress Notes (Signed)
Pt state left hip pain that travels down his left leg. Pt state walking, sitting and bending makes the pain worse. Pt state he take over the counter pain meds to help ease his pain.  Numeric Pain Rating Scale and Functional Assessment Average Pain 10   In the last MONTH (on 0-10 scale) has pain interfered with the following?  1. General activity like being  able to carry out your everyday physical activities such as walking, climbing stairs, carrying groceries, or moving a chair?  Rating(10)   +Driver, -BT, -Dye Allergies.

## 2021-01-17 ENCOUNTER — Telehealth: Payer: Self-pay | Admitting: Physical Medicine and Rehabilitation

## 2021-01-17 NOTE — Telephone Encounter (Signed)
Patient called requesting a call back to set an appt for left leg pains. Patient phone number is [phone].

## 2021-01-18 ENCOUNTER — Other Ambulatory Visit: Payer: Self-pay | Admitting: Orthopaedic Surgery

## 2021-01-20 NOTE — Telephone Encounter (Signed)
Called pt and LVM #1 

## 2021-01-29 ENCOUNTER — Ambulatory Visit: Payer: Self-pay | Admitting: Orthopaedic Surgery

## 2021-01-30 ENCOUNTER — Telehealth: Payer: Self-pay

## 2021-01-30 NOTE — Telephone Encounter (Signed)
Pt came into the office and would like to make an apt with Dr. Alvester Morin

## 2021-01-30 NOTE — Telephone Encounter (Signed)
Pt called for a repeat of Left L4-L5 TF on 12/23/20. Pt is having the same pain, same side and no new injury. Please Advise

## 2021-01-31 NOTE — Telephone Encounter (Signed)
Unable to determine amount of relief or length of time for relief. Scheduled for office visit.

## 2021-02-05 ENCOUNTER — Other Ambulatory Visit: Payer: Self-pay

## 2021-02-05 ENCOUNTER — Ambulatory Visit (INDEPENDENT_AMBULATORY_CARE_PROVIDER_SITE_OTHER): Payer: Self-pay | Admitting: Orthopaedic Surgery

## 2021-02-05 ENCOUNTER — Encounter: Payer: Self-pay | Admitting: Orthopaedic Surgery

## 2021-02-05 DIAGNOSIS — M5442 Lumbago with sciatica, left side: Secondary | ICD-10-CM

## 2021-02-05 NOTE — Progress Notes (Signed)
The patient is a pleasant 40 year old French-speaking gentleman who comes for follow-up after having a lumbar epidural steroid injection by Dr. Alvester Morin.  We sent him to Dr. Alvester Morin because of a symptomatic herniated disc.  He has a left foraminal disc protrusion at L4-L5 resulting in severe left neuroforaminal narrowing.  The patient states that the epidural injection really did not help at all.  He still has a lot of pain in the low back but a lot of radicular pain going down his left leg.  This is causing him to have difficulty sleeping as well.  He still denies any change in bowel or bladder function.  Even today he is walking with a limp favoring the left side.  He has a positive straight leg raise on the left side and subjective numbness down the lateral aspect of his left leg.  It is normal on the right side.  There is slight weakness with dorsiflexion of the left foot.  At this point I would like to send him to Dr. Ophelia Charter for the possibility of lumbar spine surgery in terms of potentially a microdiscectomy at the left L4-L5.  Certainly I would leave this up to the expertise of Dr. Ophelia Charter but I am at a loss of what else we can recommend based on the patient's MRI findings and clinical exam findings.  He agrees with this referral.

## 2021-02-11 ENCOUNTER — Ambulatory Visit (INDEPENDENT_AMBULATORY_CARE_PROVIDER_SITE_OTHER): Payer: Self-pay | Admitting: Physical Medicine and Rehabilitation

## 2021-02-11 ENCOUNTER — Encounter: Payer: Self-pay | Admitting: Physical Medicine and Rehabilitation

## 2021-02-11 ENCOUNTER — Other Ambulatory Visit: Payer: Self-pay

## 2021-02-11 VITALS — BP 133/85 | HR 69

## 2021-02-11 DIAGNOSIS — M5416 Radiculopathy, lumbar region: Secondary | ICD-10-CM

## 2021-02-11 DIAGNOSIS — M5116 Intervertebral disc disorders with radiculopathy, lumbar region: Secondary | ICD-10-CM

## 2021-02-11 MED ORDER — GABAPENTIN 300 MG PO CAPS: ORAL_CAPSULE | ORAL | 2 refills | Status: DC

## 2021-02-11 NOTE — Progress Notes (Signed)
No relief from injection. Left leg pain. Reports that pain is now worse. Patient is aware of consult with Dr. Ophelia Charter. He would like to know if his back got worse. Difficulty standing.  Numeric Pain Rating Scale and Functional Assessment Average Pain 9   In the last MONTH (on 0-10 scale) has pain interfered with the following?  1. General activity like being  able to carry out your everyday physical activities such as walking, climbing stairs, carrying groceries, or moving a chair?  Rating(9)

## 2021-02-12 ENCOUNTER — Encounter: Payer: Self-pay | Admitting: Physical Medicine and Rehabilitation

## 2021-02-12 NOTE — Progress Notes (Signed)
Isaac Fletcher - 40 y.o. male MRN [id number]  Date of birth: 08-Feb-1981  Office Visit Note: Visit Date: 02/11/2021 PCP: Patient, No Pcp Per (Inactive) Referred by: No ref. provider found  Subjective: Chief Complaint  Patient presents with  . Lower Back - Pain   HPI: Isaac Fletcher is a 40 y.o. male who comes in today For evaluation and management of continued severe chronic left hip and leg pain consistent with an L4 somewhat L5 radiculopathy.  He has pain and paresthesia in concordant dermatomes.  He has no focal weakness.  He reports through the interpreter today that he has had increased pain now of the left leg but now across the back as well.  Originally referred to [place] by Dr. Magnus Ivan.  I did complete left L4 transforaminal epidural steroid injection which really did not give him much relief at all.  He said maybe 25% relief for a day or 2.  I did review the images with him and it shows a well-placed epidural injection at L4.  He has had MRI showing foraminal narrowing Duda foraminal disc herniation at [name].  Pretty significant impact to the foramen.  Despite that we did get good medication through that area.  He has been taking tizanidine and gabapentin but only gabapentin at night.  He denies any right-sided complaints.  He has had no red flag symptoms.  He endorses that the medications have not helped very much.  He did have a course of opioid medication prior to imaging and other treatment and this only helped a little bit.  He has had therapy.  All those notes can be reviewed.  His last visit with Dr. Magnus Ivan coincided after a phone call with [place] and we had scheduled him for this appointment do to difficulties with language on the phone.  Dr. Magnus Ivan has referred him to Dr. Prince Solian for surgical evaluation and he does have that appointment coming up in the next week.  Review of Systems  Musculoskeletal: Positive for back pain.       Left radicular leg pain  Neurological: Positive  for tingling.  All other systems reviewed and are negative.  Otherwise per HPI.  Assessment & Plan: Visit Diagnoses:    ICD-10-CM   1. Radiculopathy due to lumbar intervertebral disc disorder  M51.16   2. Lumbar radiculopathy  M54.16      Plan: Findings:  Chronic worsening severe recalcitrant left hip and leg pain and low back pain consistent with disc herniation at [name] which is predominantly foraminal with severe foraminal narrowing.  Patient has failed all manner of conservative care and now injection treatment.  Could consider epidural injection from an interlaminar approach to try to get some medication along that area without attempting the foramen although we did get good flow contrast through the foramen along the nerve root into the epidural space on the last injection which was not very beneficial to him.  At this point I want to increase his Neurontin to try to get him up to 300 mg 3 times a day.  We wrote a new prescription for that and gave detailed instructions through the interpreter.  He will follow-up with Dr. Prince Solian for surgical consideration.    Meds & Orders:  Meds ordered this encounter  Medications  . gabapentin (NEURONTIN) 300 MG capsule    Sig: Take 1 capsule by mouth at night for 7 nights and then 1 in the morning and one at night for 7 days  and then 3 times a day.    Dispense:  90 capsule    Refill:  2   No orders of the defined types were placed in this encounter.   Follow-up: Return for Dr. Annell Greening as scheduled.   Procedures: No procedures performed      Clinical History: No specialty comments available.   He reports that he has never smoked. He has never used smokeless tobacco. No results for input(s): HGBA1C, LABURIC in the last 8760 hours.  Objective:  VS:  HT:    WT:   BMI:     BP:133/85  HR:69bpm  TEMP: ( )  RESP:  Physical Exam Vitals and nursing note reviewed.  Constitutional:      General: He is not in acute distress.     Appearance: Normal appearance. He is not ill-appearing.  HENT:     Head: Normocephalic and atraumatic.     Right Ear: External ear normal.     Left Ear: External ear normal.     Nose: No congestion.  Eyes:     Extraocular Movements: Extraocular movements intact.  Cardiovascular:     Rate and Rhythm: Normal rate.     Pulses: Normal pulses.  Pulmonary:     Effort: Pulmonary effort is normal. No respiratory distress.  Abdominal:     General: There is no distension.     Palpations: Abdomen is soft.  Musculoskeletal:        General: No tenderness or signs of injury.     Cervical back: Neck supple.     Right lower leg: No edema.     Left lower leg: No edema.     Comments: Patient has good distal strength without clonus.  He has good strength with knee flexion and extension.  He has a positive slump test to some degree on the left but any sort of movement causes his back to hurt.  He has tight muscle pain throughout the lower lumbar region to palpation.  He is somewhat slow to rise from a seated position.  He does feel better standing than seated.  Skin:    Findings: No erythema or rash.  Neurological:     General: No focal deficit present.     Mental Status: He is alert and oriented to person, place, and time.     Sensory: No sensory deficit.     Motor: No weakness or abnormal muscle tone.     Coordination: Coordination normal.  Psychiatric:        Mood and Affect: Mood normal.        Behavior: Behavior normal.     Ortho Exam  Imaging: No results found.  Past Medical/Family/Surgical/Social History: Medications & Allergies reviewed per EMR, new medications updated. There are no problems to display for this patient.  Past Medical History:  Diagnosis Date  . H/O hernia repair    History reviewed. No pertinent family history. History reviewed. No pertinent surgical history. Social History   Occupational History  . Not on file  Tobacco Use  . Smoking status: Never Smoker   . Smokeless tobacco: Never Used  Vaping Use  . Vaping Use: Never used  Substance and Sexual Activity  . Alcohol use: Never  . Drug use: Never  . Sexual activity: Not on file

## 2021-02-19 ENCOUNTER — Ambulatory Visit (INDEPENDENT_AMBULATORY_CARE_PROVIDER_SITE_OTHER): Payer: Self-pay | Admitting: Orthopaedic Surgery

## 2021-02-19 ENCOUNTER — Other Ambulatory Visit: Payer: Self-pay

## 2021-02-19 ENCOUNTER — Encounter: Payer: Self-pay | Admitting: Orthopaedic Surgery

## 2021-02-19 DIAGNOSIS — M7062 Trochanteric bursitis, left hip: Secondary | ICD-10-CM

## 2021-02-19 DIAGNOSIS — M48061 Spinal stenosis, lumbar region without neurogenic claudication: Secondary | ICD-10-CM | POA: Insufficient documentation

## 2021-02-19 NOTE — Progress Notes (Signed)
Office Visit Note   Patient: Isaac Fletcher           Date of Birth: [birth year]           MRN: [id number] Visit Date: 02/19/2021              Requested by: No referring provider defined for this encounter. PCP: Patient, No Pcp Per (Inactive)   Assessment & Plan: Visit Diagnoses:  1. Lumbar foraminal stenosis   2. Trochanteric bursitis, left hip     Plan: Trochanteric injection performed which she tolerated well.  We will check him in a month.  We discussed surgical intervention but presently does not have significant nerve root tension signs and has no isolated motor weakness.  Recheck 1 month.  We discussed that with time his disc protrusion may resolve with resolution of his symptoms.  Follow-Up Instructions: Return in about 1 month (around 03/21/2021).   Orders:  Orders Placed This Encounter  Procedures   Large Joint Inj   No orders of the defined types were placed in this encounter.     Procedures: Large Joint Inj: L greater trochanter on 02/19/2021 11:40 AM Details: 22 G needle, lateral approach  Arthrogram: No      Clinical Data: No additional findings.   Subjective: Chief Complaint  Patient presents with   Lower Back - Pain    HPI 40 year old male from [place] , [place] with back pain left radicular pain present since January.  He states he had been working in [place] but has not worked in a few years.  He worked in [employer].  He has had back pain left leg pain since January 2022 and has been amatory with a limp.  Pain is worse in the morning does better when he runs hot water over his buttocks.  Pain radiates down toward his knee sometimes down to his foot.  MRI showed foraminal stenosis with extraforaminal disc protrusion at [name] on the left and mild to moderate foraminal narrowing on the right.  Patient had an epidural injection 12/23/2020, also 02/11/2021 without significant relief.  He has been on some hydrocodone in the past, Neurontin, muscle  relaxants, prednisone Dosepaks.  Patient fluent in speech [place] speaks fairly good [place].  Review of Systems all systems noncontributory HPI.   Objective: Vital Signs: BP 135/89   Pulse 93   Ht 6' (1.829 m)   Wt 196 lb 6.4 oz (89.1 kg)   BMI 26.64 kg/m   Physical Exam Constitutional:      Appearance: He is well-developed.  HENT:     Head: Normocephalic and atraumatic.     Right Ear: External ear normal.     Left Ear: External ear normal.  Eyes:     Pupils: Pupils are equal, round, and reactive to light.  Neck:     Thyroid: No thyromegaly.     Trachea: No tracheal deviation.  Cardiovascular:     Rate and Rhythm: Normal rate.  Pulmonary:     Effort: Pulmonary effort is normal.     Breath sounds: No wheezing.  Abdominal:     General: Bowel sounds are normal.     Palpations: Abdomen is soft.  Musculoskeletal:     Cervical back: Neck supple.  Skin:    General: Skin is warm and dry.     Capillary Refill: Capillary refill takes less than 2 seconds.  Neurological:     Mental Status: He is alert and oriented to person, place, and time.  Psychiatric:        Behavior: Behavior normal.        Thought Content: Thought content normal.        Judgment: Judgment normal.    Ortho Exam patient negative straight leg raising 90 degrees.  He is able to heel and toe walk.  He ambulates with a left leg limp.  He has trochanteric bursal tenderness significant on the left mild sciatic notch tenderness.  Hip flexors quads are strong anterior tib EHL is normal to resisted testing no atrophy of the lower extremities.  Specialty Comments:  No specialty comments available.  Imaging: CLINICAL DATA:  Low back pain, cauda equina syndrome suspected.   EXAM: MRI LUMBAR SPINE WITHOUT CONTRAST   TECHNIQUE: Multiplanar, multisequence MR imaging of the lumbar spine was performed. No intravenous contrast was administered.   COMPARISON:  Radiographs October 07, 2020    FINDINGS: Segmentation:  Standard.   Alignment:  Straightening of the lumbar lordosis.   Vertebrae: No fracture, evidence of discitis, or bone lesion. Congenitally short pedicles.   Conus medullaris and cauda equina: Conus extends to the L2 level. Conus and cauda equina appear normal.   Paraspinal and other soft tissues: Negative.   Disc levels:   T12-L1:No spinal canal or neural foraminal stenosis.   L1-2:No spinal canal or neural foraminal stenosis.   L2-3:No spinal canal or neural foraminal stenosis.   L3-4:No spinal canal or neural foraminal stenosis.   [name]:Disc bulge with superimposed left foraminal disc protrusion resulting moderate right and severe left neural foraminal narrowing.No spinal canal stenosis.   L5-S1:Shallow disc bulge resulting in moderate right neural foraminal narrowing.No spinal canal stenosis.   IMPRESSION: 1. No acute abnormality of the lumbar spine. 2. Disc bulge with superimposed left foraminal disc protrusion at L4-L5 resulting in moderate right and severe left neural foraminal narrowing. 3. Moderate right neural foraminal narrowing at L5-S1. 4. No spinal canal stenosis at any level.     Electronically Signed   By: Baldemar Lenis M.D.   On: 11/14/2020 10:40     PMFS History: Patient Active Problem List   Diagnosis Date Noted   Lumbar foraminal stenosis 02/19/2021   Trochanteric bursitis, left hip 02/19/2021   Past Medical History:  Diagnosis Date   H/O hernia repair     No family history on file.  No past surgical history on file. Social History   Occupational History   Not on file  Tobacco Use   Smoking status: Never   Smokeless tobacco: Never  Vaping Use   Vaping Use: Never used  Substance and Sexual Activity   Alcohol use: Never   Drug use: Never   Sexual activity: Not on file

## 2021-02-25 ENCOUNTER — Telehealth: Payer: Self-pay | Admitting: Orthopaedic Surgery

## 2021-02-25 NOTE — Telephone Encounter (Signed)
Pt (calling with translator on the line) stating he is still in lots of pain and would like to look into surgery as an option as the injections are not helping and possibly if he can be worked in sooner than the 7/6 date as he is in lots of pain. Pt expressed concern that him not having insurance will hurt him in getting the surgery here with [place] as well as if him not having ins will hurt him receiving medications to help manage pain. Pt stated he is also out of his prescription of a medication but stated he would call back with what it is as soon as he find it as he does not know what it is and can not immediately find it. The best call back number is [phone].

## 2021-02-26 ENCOUNTER — Telehealth: Payer: Self-pay | Admitting: Orthopaedic Surgery

## 2021-02-26 MED ORDER — GABAPENTIN 300 MG PO CAPS: ORAL_CAPSULE | ORAL | 2 refills | Status: DC

## 2021-02-26 NOTE — Telephone Encounter (Signed)
I left voicemail advising. Asked for return call if continued questions/problems.

## 2021-02-26 NOTE — Telephone Encounter (Signed)
Refill sent to pharmacy.   

## 2021-02-26 NOTE — Telephone Encounter (Signed)
Please advise. Would you like for me to work patient in to the schedule next week?

## 2021-02-26 NOTE — Telephone Encounter (Signed)
I left voicemail for patient advising medication sent to pharmacy.

## 2021-02-26 NOTE — Telephone Encounter (Signed)
Please advise 

## 2021-02-26 NOTE — Telephone Encounter (Signed)
Patient called. He would like a refill on gabapentin. His call back number is [phone]

## 2021-03-12 ENCOUNTER — Encounter: Payer: Self-pay | Admitting: Orthopaedic Surgery

## 2021-03-12 ENCOUNTER — Ambulatory Visit (INDEPENDENT_AMBULATORY_CARE_PROVIDER_SITE_OTHER): Payer: Self-pay | Admitting: Orthopaedic Surgery

## 2021-03-12 VITALS — BP 151/85 | HR 76

## 2021-03-12 DIAGNOSIS — M48061 Spinal stenosis, lumbar region without neurogenic claudication: Secondary | ICD-10-CM

## 2021-03-12 DIAGNOSIS — M5126 Other intervertebral disc displacement, lumbar region: Secondary | ICD-10-CM

## 2021-03-13 DIAGNOSIS — M5126 Other intervertebral disc displacement, lumbar region: Secondary | ICD-10-CM | POA: Insufficient documentation

## 2021-03-13 DIAGNOSIS — M5116 Intervertebral disc disorders with radiculopathy, lumbar region: Secondary | ICD-10-CM | POA: Insufficient documentation

## 2021-03-13 NOTE — Progress Notes (Signed)
Office Visit Note   Patient: Isaac Fletcher           Date of Birth: 08-18-1981           MRN: [id number] Visit Date: 03/12/2021              Requested by: No referring provider defined for this encounter. PCP: Patient, No Pcp Per (Inactive)   Assessment & Plan: Visit Diagnoses:  1. Lumbar foraminal stenosis   2. Protrusion of lumbar intervertebral disc     Plan: Left [name] disc protrusion with persistent radicular symptoms failed conservative treatment greater than 6 months and inability for him to resume work activity.  Plan left [name] microdiscectomy with overnight stay.  We discussed MRI results reviewed MRI scan with him again.  Discussed risks of disc recurrence of 5 to 10%.  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  Patient failed anti-inflammatories, gabapentin, muscle relaxants, injections, epidurals.  Persistent nerve root tension signs and persistent radicular pain symptoms.  Plan overnight stay in the hospital.  He has a brother who would be available to be with him after surgery.  Questions were elicited and answered he understands request to proceed.  Follow-Up Instructions: No follow-ups on file.   Orders:  No orders of the defined types were placed in this encounter.  No orders of the defined types were placed in this encounter.     Procedures: No procedures performed   Clinical Data: No additional findings.   Subjective: Chief Complaint  Patient presents with   Lower Back - Pain, Follow-up    HPI 40 year old male from [place] has been working in [place] moved here over the last greater than 6 months has not been able to work due to persistent problems with back and left leg pain.  MRI scan showed disc protrusion with foraminal stenosis and extraforaminal component.  He has had persistent radicular symptoms.  Has been treated with hydrocodone, Neurontin, muscle relaxants, prednisone Dosepaks, and epidural steroid  injections.  He is not taking any current narcotic medication.  Visits to the ER for his persistent problems with back and leg pain.  Trochanteric injection and epidural injection in June without sustained relief.  MRI scan 11/14/2020 showed disc bulge left foraminal disc protrusion with severe left neural foraminal stenosis.  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  Patient states he has to get something done because the pain keeps him from sleeping and will not allow him to work.  ROS: CV, respiratory GI negative.  Previous hernia repair.  Non-smoker.  All other systems negative other than as mentioned in HPI.   Objective: Vital Signs: BP (!) 151/85   Pulse 76   Physical Exam Constitutional:      Appearance: He is well-developed.  HENT:     Head: Normocephalic and atraumatic.     Right Ear: External ear normal.     Left Ear: External ear normal.  Eyes:     Pupils: Pupils are equal, round, and reactive to light.  Neck:     Thyroid: No thyromegaly.     Trachea: No tracheal deviation.  Cardiovascular:     Rate and Rhythm: Normal rate.  Pulmonary:     Effort: Pulmonary effort is normal.     Breath sounds: No wheezing.  Abdominal:     General: Bowel sounds are normal.     Palpations: Abdomen is soft.  Musculoskeletal:     Cervical back:  Neck supple.  Skin:    General: Skin is warm and dry.     Capillary Refill: Capillary refill takes less than 2 seconds.  Neurological:     Mental Status: He is alert and oriented to person, place, and time.  Psychiatric:        Behavior: Behavior normal.        Thought Content: Thought content normal.        Judgment: Judgment normal.    Ortho Exam positive straight leg raising on the left at 70 degrees.  He is able to heel and toe walk.  Positive popliteal compression test positive sciatic notch tenderness on the left.  Negative logroll to the hips.  No groin calf atrophy hamstrings are strong.  Specialty Comments:   No specialty comments available.  Imaging: CLINICAL DATA:  Low back pain, cauda equina syndrome suspected.   EXAM: MRI LUMBAR SPINE WITHOUT CONTRAST   TECHNIQUE: Multiplanar, multisequence MR imaging of the lumbar spine was performed. No intravenous contrast was administered.   COMPARISON:  Radiographs October 07, 2020   FINDINGS: Segmentation:  Standard.   Alignment:  Straightening of the lumbar lordosis.   Vertebrae: No fracture, evidence of discitis, or bone lesion. Congenitally short pedicles.   Conus medullaris and cauda equina: Conus extends to the L2 level. Conus and cauda equina appear normal.   Paraspinal and other soft tissues: Negative.   Disc levels:   T12-L1:No spinal canal or neural foraminal stenosis.   L1-2:No spinal canal or neural foraminal stenosis.   L2-3:No spinal canal or neural foraminal stenosis.   L3-4:No spinal canal or neural foraminal stenosis.   [name]:Disc bulge with superimposed left foraminal disc protrusion resulting moderate right and severe left neural foraminal narrowing.No spinal canal stenosis.   L5-S1:Shallow disc bulge resulting in moderate right neural foraminal narrowing.No spinal canal stenosis.   IMPRESSION: 1. No acute abnormality of the lumbar spine. 2. Disc bulge with superimposed left foraminal disc protrusion at L4-L5 resulting in moderate right and severe left neural foraminal narrowing. 3. Moderate right neural foraminal narrowing at L5-S1. 4. No spinal canal stenosis at any level.     Electronically Signed   By: Baldemar Lenis M.D.   On: 11/14/2020 10:40     PMFS History: Patient Active Problem List   Diagnosis Date Noted   Protrusion of lumbar intervertebral disc 03/13/2021   Lumbar foraminal stenosis 02/19/2021   Trochanteric bursitis, left hip 02/19/2021   Past Medical History:  Diagnosis Date   H/O hernia repair     History reviewed. No pertinent family history.  History  reviewed. No pertinent surgical history. Social History   Occupational History   Not on file  Tobacco Use   Smoking status: Never   Smokeless tobacco: Never  Vaping Use   Vaping Use: Never used  Substance and Sexual Activity   Alcohol use: Never   Drug use: Never   Sexual activity: Not on file

## 2021-03-18 ENCOUNTER — Inpatient Hospital Stay (HOSPITAL_COMMUNITY)
Admission: RE | Admit: 2021-03-18 | Discharge: 2021-03-18 | Disposition: A | Discharge status: Home / Self Care | Payer: Self-pay | Source: Ambulatory Visit

## 2021-03-19 ENCOUNTER — Encounter (HOSPITAL_COMMUNITY): Payer: Self-pay | Admitting: Orthopaedic Surgery

## 2021-03-19 ENCOUNTER — Other Ambulatory Visit: Payer: Self-pay

## 2021-03-19 ENCOUNTER — Ambulatory Visit (INDEPENDENT_AMBULATORY_CARE_PROVIDER_SITE_OTHER): Payer: Self-pay | Admitting: Surgery

## 2021-03-19 ENCOUNTER — Encounter: Payer: Self-pay | Admitting: Surgery

## 2021-03-19 VITALS — BP 131/79 | HR 70 | Temp 98.8°F | Ht 73.0 in | Wt 196.0 lb

## 2021-03-19 DIAGNOSIS — M5126 Other intervertebral disc displacement, lumbar region: Secondary | ICD-10-CM

## 2021-03-19 NOTE — Progress Notes (Signed)
Used [place] interpreter with [employer]  PCP - no Cardiologist - denies  PPM/ICD - denies   Chest x-ray - n/a EKG - n/a Stress Test - denies ECHO - denies Cardiac Cath - denies  CPAP - denies  No diabetes  Patient instructed to hold all Aspirin, NSAID's, herbal medications, fish oil and vitamins 7 days prior to surgery.   ERAS Protcol - You may drink clear liquids until 04:30am the morning of your surgery.   Clear liquids allowed are: Water, Non-Citrus Juices (without pulp), Carbonated Beverages, Clear Tea, Black Coffee Only, and Gatorade  COVID TEST- 03/20/21 at drive thru. Address given.   Anesthesia review: no  Patient verbally denies any shortness of breath, fever, cough and chest pain during phone call   -------------  SDW INSTRUCTIONS given:  Your procedure is scheduled on 03/21/21.  Report to [facility] Main Entrance "A" at 05:30 A.M., and check in at the Admitting office.  Call this number if you have problems the morning of surgery:  [phone]   Remember:  Do not eat after midnight the night before your surgery  You may drink clear liquids until 04:30am the morning of your surgery.   Clear liquids allowed are: Water, Non-Citrus Juices (without pulp), Carbonated Beverages, Clear Tea, Black Coffee Only, and Gatorade    Take these medicines the morning of surgery with A SIP OF WATER  gabapentin (NEURONTIN)   As of today, STOP taking any Aspirin (unless otherwise instructed by your surgeon) Aleve, Naproxen, Ibuprofen, Motrin, Advil, Goody's, BC's, all herbal medications, fish oil, and diclofenac (VOLTAREN) all vitamins.                      Do not wear jewelry, make up, or nail polish            Do not wear lotions, powders, colognes, or deodorant.            Men may shave face and neck.            Do not bring valuables to the hospital.            [facility] is not responsible for any belongings or valuables.  Do NOT Smoke (Tobacco/Vaping) or  drink Alcohol 24 hours prior to your procedure If you use a CPAP at night, you may bring all equipment for your overnight stay.   Contacts, glasses, dentures or bridgework may not be worn into surgery.      For patients admitted to the hospital, discharge time will be determined by your treatment team.   Patients discharged the day of surgery will not be allowed to drive home, and someone needs to stay with them for 24 hours.    Special instructions:   [place]- Preparing For Surgery  Before surgery, you can play an important role. Because skin is not sterile, your skin needs to be as free of germs as possible. You can reduce the number of germs on your skin by washing with CHG (chlorahexidine gluconate) Soap before surgery.  CHG is an antiseptic cleaner which kills germs and bonds with the skin to continue killing germs even after washing.    Oral Hygiene is also important to reduce your risk of infection.  Remember - BRUSH YOUR TEETH THE MORNING OF SURGERY WITH YOUR REGULAR TOOTHPASTE  Please do not use if you have an allergy to CHG or antibacterial soaps. If your skin becomes reddened/irritated stop using the CHG.  Do not shave (  including legs and underarms) for at least 48 hours prior to first CHG shower. It is OK to shave your face.  Please follow these instructions carefully.   Shower the NIGHT BEFORE SURGERY and the MORNING OF SURGERY with DIAL Soap.   Pat yourself dry with a CLEAN TOWEL.  Wear CLEAN PAJAMAS to bed the night before surgery  Place CLEAN SHEETS on your bed the night of your first shower and DO NOT SLEEP WITH PETS.   Day of Surgery: Please shower morning of surgery  Wear Clean/Comfortable clothing the morning of surgery Do not apply any deodorants/lotions.   Remember to brush your teeth WITH YOUR REGULAR TOOTHPASTE.   Questions were answered. Patient verbalized understanding of instructions.

## 2021-03-19 NOTE — Progress Notes (Signed)
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  States that low back pain and left lower extremity radiculopathy unchanged from previous visit.  He is wanting to proceed with left [name] microdiscectomy as scheduled.  Today history and physical performed.  Review of systems negative.  Surgical procedure discussed in detail.  All questions answered.

## 2021-03-20 ENCOUNTER — Other Ambulatory Visit (HOSPITAL_COMMUNITY)
Admission: RE | Admit: 2021-03-20 | Discharge: 2021-03-20 | Disposition: A | Discharge status: Home / Self Care | Payer: Self-pay | Source: Ambulatory Visit | Attending: Orthopaedic Surgery | Admitting: Orthopaedic Surgery

## 2021-03-20 DIAGNOSIS — Z01812 Encounter for preprocedural laboratory examination: Secondary | ICD-10-CM | POA: Insufficient documentation

## 2021-03-20 DIAGNOSIS — Z20822 Contact with and (suspected) exposure to covid-19: Secondary | ICD-10-CM | POA: Insufficient documentation

## 2021-03-20 LAB — SARS CORONAVIRUS 2 (TAT 6-24 HRS): SARS Coronavirus 2: NEGATIVE

## 2021-03-20 NOTE — Anesthesia Preprocedure Evaluation (Addendum)
Anesthesia Evaluation  Patient identified by MRN, date of birth, ID band Patient awake    Reviewed: Allergy & Precautions, H&P , NPO status , Patient's Chart, lab work & pertinent test results  Airway Mallampati: I  TM Distance: >3 FB Neck ROM: Full    Dental no notable dental hx. (+) Teeth Intact, Dental Advisory Given   Pulmonary neg pulmonary ROS,    Pulmonary exam normal breath sounds clear to auscultation       Cardiovascular Exercise Tolerance: Good negative cardio ROS   Rhythm:Regular Rate:Normal     Neuro/Psych negative neurological ROS  negative psych ROS   GI/Hepatic negative GI ROS, Neg liver ROS,   Endo/Other  negative endocrine ROS  Renal/GU negative Renal ROS  negative genitourinary   Musculoskeletal   Abdominal   Peds  Hematology negative hematology ROS (+)   Anesthesia Other Findings   Reproductive/Obstetrics negative OB ROS                            Anesthesia Physical Anesthesia Plan  ASA: 1  Anesthesia Plan: General   Post-op Pain Management:    Induction: Intravenous  PONV Risk Score and Plan: 3 and Ondansetron, Dexamethasone and Midazolam  Airway Management Planned: Oral ETT  Additional Equipment:   Intra-op Plan:   Post-operative Plan: Extubation in OR  Informed Consent: I have reviewed the patients History and Physical, chart, labs and discussed the procedure including the risks, benefits and alternatives for the proposed anesthesia with the patient or authorized representative who has indicated his/her understanding and acceptance.     Dental advisory given  Plan Discussed with: CRNA  Anesthesia Plan Comments:      Anesthesia Quick Evaluation

## 2021-03-20 NOTE — H&P (Signed)
Isaac Fletcher is an 40 y.o. male.   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  States that low back pain and left lower extremity radiculopathy unchanged from previous visit.  He is wanting to proceed with left [name] microdiscectomy as scheduled.  Patient came in with an interpreter.  Past Medical History:  Diagnosis Date   H/O hernia repair     Past Surgical History:  Procedure Laterality Date   HERNIA REPAIR      History reviewed. No pertinent family history. Social History:  reports that he has never smoked. He has never used smokeless tobacco. He reports that he does not drink alcohol and does not use drugs.  Allergies: No Known Allergies  No medications prior to admission.    No results found for this or any previous visit (from the past 48 hour(s)). No results found.  Review of Systems  Constitutional:  Positive for activity change.  HENT: Negative.    Respiratory: Negative.    Cardiovascular: Negative.   Gastrointestinal: Negative.   Genitourinary: Negative.   Musculoskeletal:  Positive for back pain and gait problem.  Skin: Negative.   Neurological:  Positive for numbness.   Height 6\' 1"  (1.854 m). Physical Exam HENT:     Head: Normocephalic and atraumatic.  Eyes:     Extraocular Movements: Extraocular movements intact.     Pupils: Pupils are equal, round, and reactive to light.  Cardiovascular:     Rate and Rhythm: Normal rate and regular rhythm.  Pulmonary:     Effort: Pulmonary effort is normal. No respiratory distress.     Breath sounds: Normal breath sounds.  Abdominal:     General: There is no distension.  Musculoskeletal:        General: Tenderness present.  Neurological:     General: No focal deficit present.     Mental Status: He is alert and oriented to person, place, and time.  Psychiatric:        Mood and Affect: Mood normal.      Assessment/Plan Left [name] HNP.  We will proceed with left [name] microdiscectomy scheduled.  Surgical procedure discussed along with potential rehab/recovery time.  All questions answered and he wishes to proceed.  , [name] 03/20/2021, 4:19 PM

## 2021-03-21 ENCOUNTER — Observation Stay (HOSPITAL_COMMUNITY)
Admission: RE | Admit: 2021-03-21 | Discharge: 2021-03-22 | Disposition: A | Discharge status: Home / Self Care | Payer: Self-pay | Attending: Orthopaedic Surgery | Admitting: Orthopaedic Surgery

## 2021-03-21 ENCOUNTER — Encounter (HOSPITAL_COMMUNITY): Payer: Self-pay | Admitting: Orthopaedic Surgery

## 2021-03-21 ENCOUNTER — Ambulatory Visit (HOSPITAL_COMMUNITY): Payer: Self-pay

## 2021-03-21 ENCOUNTER — Encounter (HOSPITAL_COMMUNITY)
Admission: RE | Disposition: A | Discharge status: Home / Self Care | Payer: Self-pay | Source: Home / Self Care | Attending: Orthopaedic Surgery

## 2021-03-21 ENCOUNTER — Other Ambulatory Visit: Payer: Self-pay

## 2021-03-21 ENCOUNTER — Ambulatory Visit (HOSPITAL_COMMUNITY): Payer: Self-pay | Admitting: Anesthesiology

## 2021-03-21 DIAGNOSIS — M5116 Intervertebral disc disorders with radiculopathy, lumbar region: Principal | ICD-10-CM | POA: Diagnosis present

## 2021-03-21 DIAGNOSIS — Z419 Encounter for procedure for purposes other than remedying health state, unspecified: Secondary | ICD-10-CM | POA: Insufficient documentation

## 2021-03-21 DIAGNOSIS — M5126 Other intervertebral disc displacement, lumbar region: Secondary | ICD-10-CM | POA: Diagnosis present

## 2021-03-21 HISTORY — PX: LUMBAR LAMINECTOMY: SHX95

## 2021-03-21 LAB — COMPREHENSIVE METABOLIC PANEL
ALT: 85 U/L — ABNORMAL HIGH (ref 0–44)
AST: 45 U/L — ABNORMAL HIGH (ref 15–41)
Albumin: 3.9 g/dL (ref 3.5–5.0)
Alkaline Phosphatase: 44 U/L (ref 38–126)
Anion gap: 8 (ref 5–15)
BUN: 12 mg/dL (ref 6–20)
CO2: 26 mmol/L (ref 22–32)
Calcium: 9.7 mg/dL (ref 8.9–10.3)
Chloride: 103 mmol/L (ref 98–111)
Creatinine, Ser: 1.18 mg/dL (ref 0.61–1.24)
GFR, Estimated: >60 mL/min (ref >60)
Glucose, Bld: 113 mg/dL — ABNORMAL HIGH (ref 70–99)
Potassium: 4.2 mmol/L (ref 3.5–5.1)
Sodium: 137 mmol/L (ref 135–145)
Total Bilirubin: 1.5 mg/dL — ABNORMAL HIGH (ref 0.3–1.2)
Total Protein: 7.1 g/dL (ref 6.5–8.1)

## 2021-03-21 LAB — SURGICAL PCR SCREEN
MRSA, PCR: NEGATIVE
Staphylococcus aureus: NEGATIVE

## 2021-03-21 LAB — CBC
HCT: 47.6 % (ref 39.0–52.0)
Hemoglobin: 14.6 g/dL (ref 13.0–17.0)
MCH: 23.3 pg — ABNORMAL LOW (ref 26.0–34.0)
MCHC: 30.7 g/dL (ref 30.0–36.0)
MCV: 75.9 fL — ABNORMAL LOW (ref 80.0–100.0)
Platelets: 249 10*3/uL (ref 150–400)
RBC: 6.27 MIL/uL — ABNORMAL HIGH (ref 4.22–5.81)
RDW: 12.5 % (ref 11.5–15.5)
WBC: 6.4 10*3/uL (ref 4.0–10.5)
nRBC: 0 % (ref 0.0–0.2)

## 2021-03-21 IMAGING — CR DG LUMBAR SPINE 2-3V
2 series · 2 of 2 positions shown · non-contrast
Comparison: Radiographs [DATE] and MRI [DATE].

CLINICAL DATA: Left [name] micro discectomy.

EXAM:
LUMBAR SPINE - 2-3 VIEW

[lateral (1 of 2)]
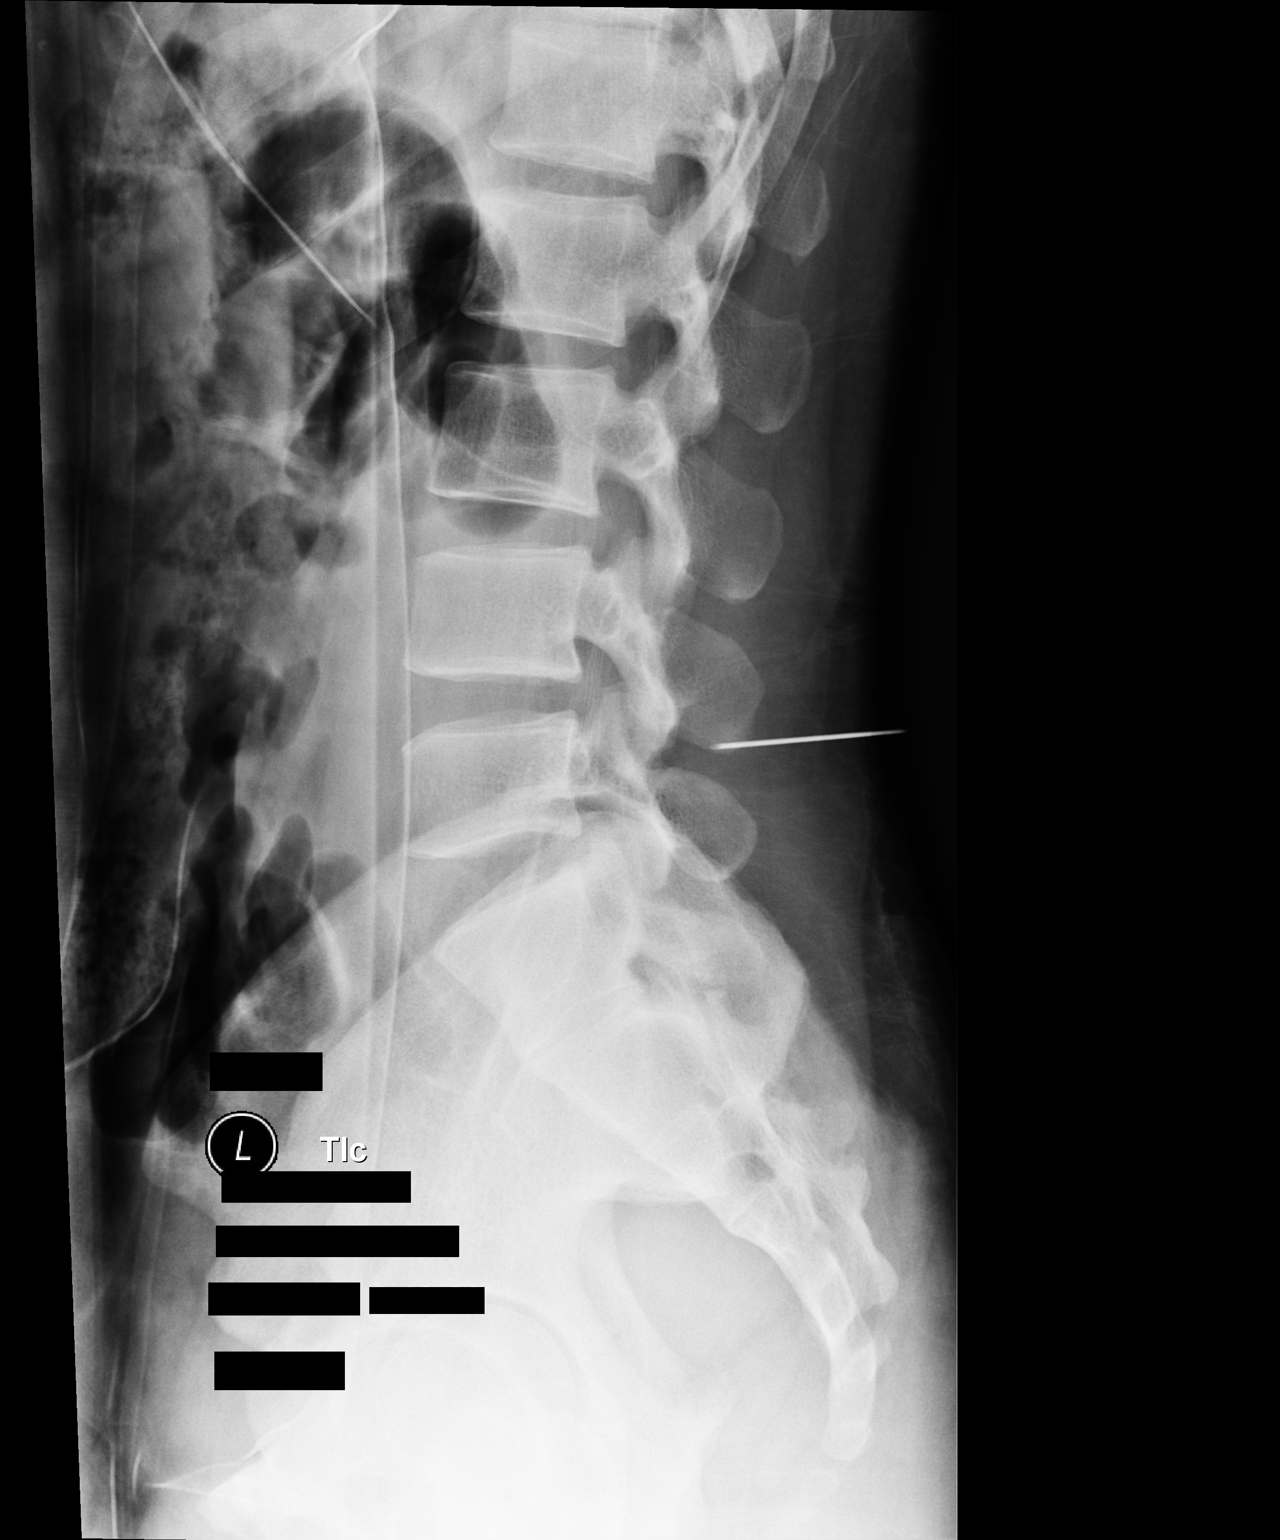

[lateral (2 of 2)]
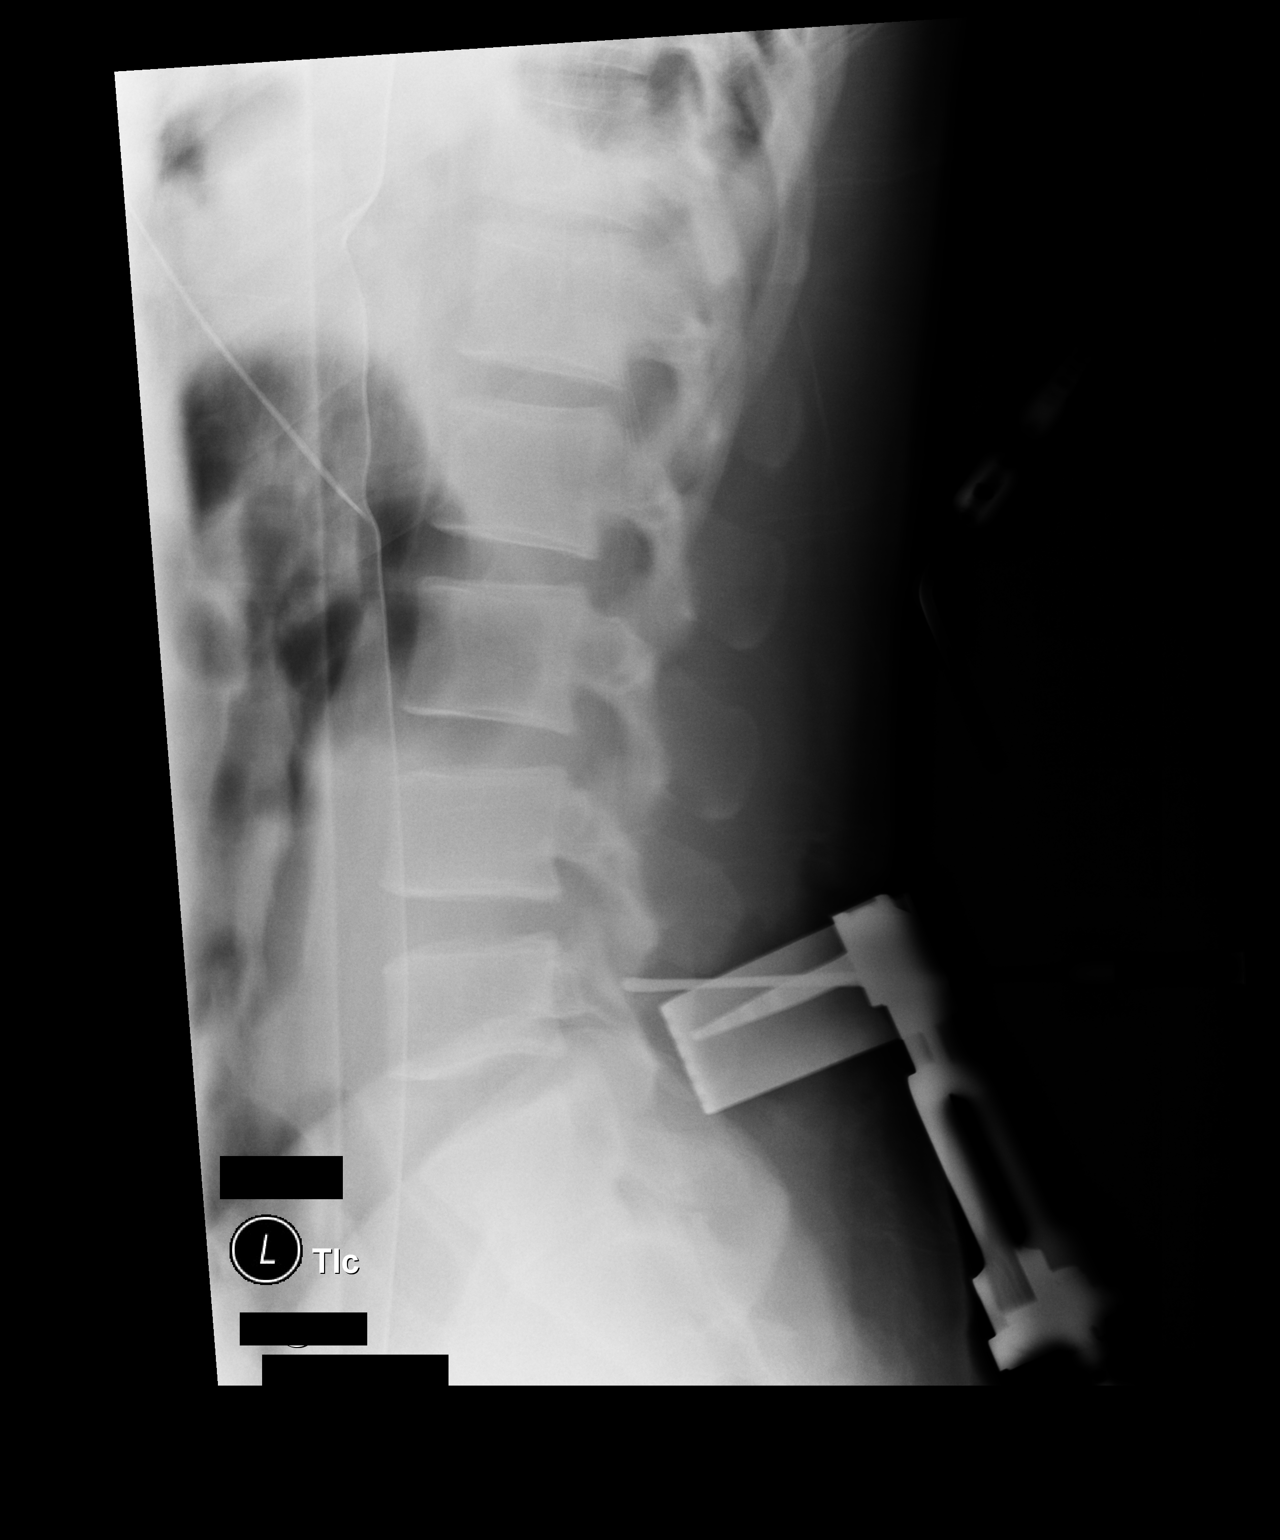

[2 of 2 positions shown; findings below may reference images not displayed]

FINDINGS: Two cross-table lateral views of the lumbar spine are submitted from
the operating room. Patient has 5 lumbar type vertebral bodies as
correlated with prior studies.

Image number 1 at [HH] hours demonstrates a posterior localizing
needle just inferior to the L4 spinous process.

Image number 2 at [HH] hours demonstrates skin spreaders posteriorly
at L5-S1 with a surgical instrument directed towards the L5
pedicles.
IMPRESSION: Intraoperative localization views as described. Of note, the
instruments on the final image are inferior to the [name] disc space
and level of primary pathology on MRI.

## 2021-03-21 SURGERY — MICRODISCECTOMY LUMBAR LAMINECTOMY
Anesthesia: General

## 2021-03-21 MED ORDER — ROCURONIUM BROMIDE 10 MG/ML (PF) SYRINGE
PREFILLED_SYRINGE | INTRAVENOUS | Status: AC
  Filled 2021-03-21: qty 10

## 2021-03-21 MED ORDER — BUPIVACAINE HCL (PF) 0.25 % IJ SOLN
INTRAMUSCULAR | Status: DC | PRN
  Administered 2021-03-21: 10 mL

## 2021-03-21 MED ORDER — BUPIVACAINE HCL (PF) 0.25 % IJ SOLN
INTRAMUSCULAR | Status: AC
  Filled 2021-03-21: qty 30

## 2021-03-21 MED ORDER — GABAPENTIN 300 MG PO CAPS
300.0000 mg | ORAL_CAPSULE | Freq: Two times a day (BID) | ORAL | Status: DC
  Administered 2021-03-21 – 2021-03-22 (×3): 300 mg via ORAL
  Filled 2021-03-21 (×3): qty 1

## 2021-03-21 MED ORDER — ORAL CARE MOUTH RINSE: 15.0000 mL | Freq: Once | OROMUCOSAL | Status: AC

## 2021-03-21 MED ORDER — METHOCARBAMOL 500 MG PO TABS
ORAL_TABLET | ORAL | Status: AC
  Filled 2021-03-21: qty 1

## 2021-03-21 MED ORDER — 0.9 % SODIUM CHLORIDE (POUR BTL) OPTIME
TOPICAL | Status: DC | PRN
  Administered 2021-03-21: 1000 mL

## 2021-03-21 MED ORDER — LIDOCAINE 2% (20 MG/ML) 5 ML SYRINGE
INTRAMUSCULAR | Status: DC | PRN
  Administered 2021-03-21: 60 mg via INTRAVENOUS

## 2021-03-21 MED ORDER — FENTANYL CITRATE (PF) 250 MCG/5ML IJ SOLN
INTRAMUSCULAR | Status: AC
  Filled 2021-03-21: qty 5

## 2021-03-21 MED ORDER — ACETAMINOPHEN 650 MG RE SUPP: 650.0000 mg | RECTAL | Status: DC | PRN

## 2021-03-21 MED ORDER — MENTHOL 3 MG MT LOZG: 1.0000 | LOZENGE | OROMUCOSAL | Status: DC | PRN

## 2021-03-21 MED ORDER — HYDROMORPHONE HCL 1 MG/ML IJ SOLN
0.2500 mg | INTRAMUSCULAR | Status: DC | PRN
  Administered 2021-03-21: 0.5 mg via INTRAVENOUS

## 2021-03-21 MED ORDER — DEXAMETHASONE SODIUM PHOSPHATE 10 MG/ML IJ SOLN
INTRAMUSCULAR | Status: DC | PRN
  Administered 2021-03-21: 10 mg via INTRAVENOUS

## 2021-03-21 MED ORDER — OXYCODONE HCL 5 MG PO TABS
5.0000 mg | ORAL_TABLET | ORAL | Status: DC | PRN
  Administered 2021-03-21: 5 mg via ORAL
  Administered 2021-03-21 – 2021-03-22 (×5): 10 mg via ORAL
  Filled 2021-03-21 (×5): qty 2

## 2021-03-21 MED ORDER — CEFAZOLIN SODIUM-DEXTROSE 2-4 GM/100ML-% IV SOLN
2.0000 g | INTRAVENOUS | Status: AC
  Administered 2021-03-21: 2 g via INTRAVENOUS
  Filled 2021-03-21: qty 100

## 2021-03-21 MED ORDER — PROPOFOL 10 MG/ML IV BOLUS
INTRAVENOUS | Status: AC
  Filled 2021-03-21: qty 20

## 2021-03-21 MED ORDER — DEXAMETHASONE SODIUM PHOSPHATE 10 MG/ML IJ SOLN
INTRAMUSCULAR | Status: AC
  Filled 2021-03-21: qty 1

## 2021-03-21 MED ORDER — DOCUSATE SODIUM 100 MG PO CAPS
100.0000 mg | ORAL_CAPSULE | Freq: Two times a day (BID) | ORAL | Status: DC
  Administered 2021-03-21 – 2021-03-22 (×3): 100 mg via ORAL
  Filled 2021-03-21 (×3): qty 1

## 2021-03-21 MED ORDER — LACTATED RINGERS IV SOLN: INTRAVENOUS | Status: DC

## 2021-03-21 MED ORDER — ACETAMINOPHEN 500 MG PO TABS
1000.0000 mg | ORAL_TABLET | Freq: Once | ORAL | Status: AC
  Administered 2021-03-21: 1000 mg via ORAL
  Filled 2021-03-21: qty 2

## 2021-03-21 MED ORDER — ONDANSETRON HCL 4 MG/2ML IJ SOLN
INTRAMUSCULAR | Status: AC
  Filled 2021-03-21: qty 2

## 2021-03-21 MED ORDER — METHOCARBAMOL 500 MG PO TABS: 500.0000 mg | ORAL_TABLET | Freq: Four times a day (QID) | ORAL | 0 refills | Status: DC | PRN

## 2021-03-21 MED ORDER — PHENOL 1.4 % MT LIQD: 1.0000 | OROMUCOSAL | Status: DC | PRN

## 2021-03-21 MED ORDER — SODIUM CHLORIDE 0.9% FLUSH: 3.0000 mL | INTRAVENOUS | Status: DC | PRN

## 2021-03-21 MED ORDER — METHOCARBAMOL 1000 MG/10ML IJ SOLN
500.0000 mg | Freq: Four times a day (QID) | INTRAVENOUS | Status: DC | PRN
  Filled 2021-03-21: qty 5

## 2021-03-21 MED ORDER — CHLORHEXIDINE GLUCONATE 0.12 % MT SOLN
15.0000 mL | Freq: Once | OROMUCOSAL | Status: AC
  Administered 2021-03-21: 15 mL via OROMUCOSAL
  Filled 2021-03-21: qty 15

## 2021-03-21 MED ORDER — FENTANYL CITRATE (PF) 250 MCG/5ML IJ SOLN
INTRAMUSCULAR | Status: DC | PRN
  Administered 2021-03-21 (×5): 50 ug via INTRAVENOUS

## 2021-03-21 MED ORDER — ONDANSETRON HCL 4 MG/2ML IJ SOLN: 4.0000 mg | Freq: Four times a day (QID) | INTRAMUSCULAR | Status: DC | PRN

## 2021-03-21 MED ORDER — SUGAMMADEX SODIUM 200 MG/2ML IV SOLN
INTRAVENOUS | Status: DC | PRN
  Administered 2021-03-21: 200 mg via INTRAVENOUS

## 2021-03-21 MED ORDER — LIDOCAINE 2% (20 MG/ML) 5 ML SYRINGE
INTRAMUSCULAR | Status: AC
  Filled 2021-03-21: qty 5

## 2021-03-21 MED ORDER — PROPOFOL 10 MG/ML IV BOLUS
INTRAVENOUS | Status: DC | PRN
  Administered 2021-03-21: 130 mg via INTRAVENOUS

## 2021-03-21 MED ORDER — MIDAZOLAM HCL 2 MG/2ML IJ SOLN
INTRAMUSCULAR | Status: AC
  Filled 2021-03-21: qty 2

## 2021-03-21 MED ORDER — HYDROMORPHONE HCL 1 MG/ML IJ SOLN
0.5000 mg | INTRAMUSCULAR | Status: DC | PRN
  Administered 2021-03-21: 0.5 mg via INTRAVENOUS
  Filled 2021-03-21: qty 0.5

## 2021-03-21 MED ORDER — POLYETHYLENE GLYCOL 3350 17 G PO PACK: 17.0000 g | PACK | Freq: Every day | ORAL | Status: DC | PRN

## 2021-03-21 MED ORDER — MIDAZOLAM HCL 2 MG/2ML IJ SOLN
INTRAMUSCULAR | Status: DC | PRN
  Administered 2021-03-21: 2 mg via INTRAVENOUS

## 2021-03-21 MED ORDER — SODIUM CHLORIDE 0.9 % IV SOLN: INTRAVENOUS | Status: DC

## 2021-03-21 MED ORDER — SODIUM CHLORIDE 0.9% FLUSH
3.0000 mL | Freq: Two times a day (BID) | INTRAVENOUS | Status: DC
  Administered 2021-03-21: 3 mL via INTRAVENOUS

## 2021-03-21 MED ORDER — METHOCARBAMOL 500 MG PO TABS
500.0000 mg | ORAL_TABLET | Freq: Four times a day (QID) | ORAL | Status: DC | PRN
  Administered 2021-03-21 – 2021-03-22 (×4): 500 mg via ORAL
  Filled 2021-03-21 (×3): qty 1

## 2021-03-21 MED ORDER — ACETAMINOPHEN 325 MG PO TABS
650.0000 mg | ORAL_TABLET | ORAL | Status: DC | PRN
  Administered 2021-03-21 – 2021-03-22 (×2): 650 mg via ORAL
  Filled 2021-03-21 (×2): qty 2

## 2021-03-21 MED ORDER — ONDANSETRON HCL 4 MG/2ML IJ SOLN
INTRAMUSCULAR | Status: DC | PRN
  Administered 2021-03-21: 4 mg via INTRAVENOUS

## 2021-03-21 MED ORDER — ROCURONIUM BROMIDE 10 MG/ML (PF) SYRINGE
PREFILLED_SYRINGE | INTRAVENOUS | Status: DC | PRN
  Administered 2021-03-21: 50 mg via INTRAVENOUS

## 2021-03-21 MED ORDER — HYDROMORPHONE HCL 1 MG/ML IJ SOLN
INTRAMUSCULAR | Status: AC
  Administered 2021-03-21: 0.5 mg via INTRAVENOUS
  Filled 2021-03-21: qty 1

## 2021-03-21 MED ORDER — OXYCODONE-ACETAMINOPHEN 7.5-325 MG PO TABS: 1.0000 | ORAL_TABLET | ORAL | 0 refills | Status: DC | PRN

## 2021-03-21 MED ORDER — SODIUM CHLORIDE 0.9 % IV SOLN: 250.0000 mL | INTRAVENOUS | Status: DC

## 2021-03-21 MED ORDER — OXYCODONE HCL 5 MG PO TABS
ORAL_TABLET | ORAL | Status: AC
  Filled 2021-03-21: qty 1

## 2021-03-21 MED ORDER — ONDANSETRON HCL 4 MG PO TABS: 4.0000 mg | ORAL_TABLET | Freq: Four times a day (QID) | ORAL | Status: DC | PRN

## 2021-03-21 SURGICAL SUPPLY — 47 items
ADH SKN CLS APL DERMABOND .7 (GAUZE/BANDAGES/DRESSINGS) ×1
APL SKNCLS STERI-STRIP NONHPOA (GAUZE/BANDAGES/DRESSINGS) ×1
BAG COUNTER SPONGE SURGICOUNT (BAG) ×2 IMPLANT
BAG SPNG CNTER NS LX DISP (BAG) ×1
BENZOIN TINCTURE PRP APPL 2/3 (GAUZE/BANDAGES/DRESSINGS) ×1 IMPLANT
BUR ROUND FLUTED 4 SOFT TCH (BURR) ×1 IMPLANT
CANISTER SUCT 3000ML PPV (MISCELLANEOUS) ×2 IMPLANT
CLSR STERI-STRIP ANTIMIC 1/2X4 (GAUZE/BANDAGES/DRESSINGS) ×1 IMPLANT
COVER SURGICAL LIGHT HANDLE (MISCELLANEOUS) ×2 IMPLANT
DERMABOND ADVANCED (GAUZE/BANDAGES/DRESSINGS) ×1
DERMABOND ADVANCED .7 DNX12 (GAUZE/BANDAGES/DRESSINGS) ×1 IMPLANT
DRAPE MICROSCOPE LEICA (MISCELLANEOUS) ×2 IMPLANT
DRSG MEPILEX BORDER 4X4 (GAUZE/BANDAGES/DRESSINGS) IMPLANT
DRSG MEPILEX BORDER 4X8 (GAUZE/BANDAGES/DRESSINGS) ×1 IMPLANT
DURAPREP 26ML APPLICATOR (WOUND CARE) ×2 IMPLANT
DURASEAL SPINE SEALANT 3ML (MISCELLANEOUS) IMPLANT
ELECT REM PT RETURN 9FT ADLT (ELECTROSURGICAL) ×2
ELECTRODE REM PT RTRN 9FT ADLT (ELECTROSURGICAL) ×1 IMPLANT
GAUZE SPONGE 4X4 12PLY STRL (GAUZE/BANDAGES/DRESSINGS) ×2 IMPLANT
GLOVE SRG 8 PF TXTR STRL LF DI (GLOVE) ×2 IMPLANT
GLOVE SURG ORTHO LTX SZ7.5 (GLOVE) ×4 IMPLANT
GLOVE SURG UNDER POLY LF SZ8 (GLOVE) ×4
GOWN STRL REUS W/ TWL LRG LVL3 (GOWN DISPOSABLE) ×1 IMPLANT
GOWN STRL REUS W/ TWL XL LVL3 (GOWN DISPOSABLE) ×1 IMPLANT
GOWN STRL REUS W/TWL 2XL LVL3 (GOWN DISPOSABLE) ×2 IMPLANT
GOWN STRL REUS W/TWL LRG LVL3 (GOWN DISPOSABLE) ×2
GOWN STRL REUS W/TWL XL LVL3 (GOWN DISPOSABLE) ×2
KIT BASIN OR (CUSTOM PROCEDURE TRAY) ×2 IMPLANT
KIT TURNOVER KIT B (KITS) ×2 IMPLANT
NDL SPNL 18GX3.5 QUINCKE PK (NEEDLE) ×1 IMPLANT
NEEDLE SPNL 18GX3.5 QUINCKE PK (NEEDLE) ×2 IMPLANT
NS IRRIG 1000ML POUR BTL (IV SOLUTION) ×2 IMPLANT
PACK LAMINECTOMY ORTHO (CUSTOM PROCEDURE TRAY) ×2 IMPLANT
PAD ARMBOARD 7.5X6 YLW CONV (MISCELLANEOUS) ×4 IMPLANT
PATTIES SURGICAL .5 X.5 (GAUZE/BANDAGES/DRESSINGS) IMPLANT
PATTIES SURGICAL .75X.75 (GAUZE/BANDAGES/DRESSINGS) IMPLANT
SPONGE T-LAP 4X18 ~~LOC~~+RFID (SPONGE) ×2 IMPLANT
SUT VIC AB 1 CTX 36 (SUTURE) ×2
SUT VIC AB 1 CTX36XBRD ANBCTR (SUTURE) ×1 IMPLANT
SUT VIC AB 2-0 CT1 27 (SUTURE) ×4
SUT VIC AB 2-0 CT1 TAPERPNT 27 (SUTURE) ×1 IMPLANT
SUT VIC AB 3-0 X1 27 (SUTURE) ×1 IMPLANT
SUT VICRYL 3-0 27 CRC X-1 (SUTURE) ×1 IMPLANT
SYR 20ML ECCENTRIC (SYRINGE) IMPLANT
TOWEL GREEN STERILE (TOWEL DISPOSABLE) ×2 IMPLANT
TOWEL GREEN STERILE FF (TOWEL DISPOSABLE) ×2 IMPLANT
WATER STERILE IRR 1000ML POUR (IV SOLUTION) ×2 IMPLANT

## 2021-03-21 NOTE — Discharge Instructions (Addendum)
Ok to shower 1days postop.  Do not apply any creams or ointments to incision.  Do not remove steri-strips.  You may shower with your dressing on , it is waterproof.   Can use 4x4 gauze and tape for dressing changes if you dressing comes off before you return to office.   No aggressive activity.    No bending, twisting, squatting or prolonged sitting.  Mostly be in reclined position or lying down.    Can do some walking but no long distances.   No driving.   Any questions or concerns before office visit call immediately.   Call Your Doctor If Any of These Occur  Redness, drainage, or swelling at the wound.   Temperature greater than 101 degrees.  Severe pain not relieved by pain medication.  Incision starts to come apart.

## 2021-03-21 NOTE — Op Note (Signed)
Preop diagnosis: Left [name] foraminal disc protrusion with radiculopathy.  Postop diagnosis: Same  Procedure: Left [name] microdiscectomy.  Surgeon: Ophelia Charter MD  Assistant: Zonia Kief, [name] medically necessary and present for the entire procedure  Anesthesia General plus Marcaine skin local  EBL less than 100 cc  Complications: None  Brief history: 40 year old male unable to work with [name] foraminal disc protrusion with failure of conservative treatment including on Neurontin muscle relaxants prednisone Dosepak, epidural steroid injections.  Patient had foraminal disc protrusion with small portion extraforaminal.  Congenital short pedicles with normal disc hydration.  Persistent nerve compression and symptoms.  Procedure: After induction general anesthesia orotracheal ovation patient placed prone on chest rolls arms at 9090 he will roll pads underneath the shoulders ulnar nerve.  Calf pumpers preoperative Ancef prophylaxis [name].  Area squared with towel sterile skin marker and Betadine Steri-Drape with laminectomy sheet applied.  Timeout procedure was completed.  Crosstable lateral x-ray with the needle based on palpable landmarks at the [name] level.  Incision was made subpial distal dissection and then once were down to the interlaminar space 2 x-rays were taken with the second with a [name] at the interlaminar space at L4 and L5 on the left.  Purple marker was used on the bone and laminotomy was created with a 4 mm bur removing thick chunks of ligament.  Disc was protruding left paracentral.  A portion of the facet was burred away with a 4 mm bur with the Grieco used to protect the dura.  Undercutting with 3 mm to millimeter Kerrison was performed.  Bipolar cautery was used for some epidural veins cauterization.  Drape was used to retract the dura gently toward the midline annulus was incised and chunks of disc were removed using straight micropituitary down-biting micropituitary.  The Lorette Ang  was used to push down the fragments of this diaper superficial toward the middle portion of the disc and then grasped with micropituitary straight pituitary.  Continue decompression until palpation showed that nerve was free in the foramina and there was space between the disc and the nerve root corresponding with the MRI images.  Repeat passes were made in the midline.  No disc protrusion centrally.  Infra aspect of the pedicle was palpated underneath the nerve root above the [name] disc base on the left.  Nerve root was free and no remaining protruding disc was present underneath the nerve root out the foramina.  Repeat irrigation cauterization epidural veins.  Closure fascia with #1 Vicryl 2-0 Vicryl subicular skin closure Marcaine infiltration tincture benzoin Steri-Strips postop dressing and transfer the care room in stable condition.

## 2021-03-21 NOTE — Anesthesia Procedure Notes (Signed)
Procedure Name: Intubation Date/Time: 03/21/2021 7:44 AM Performed by: Clearnce Sorrel, CRNA Pre-anesthesia Checklist: Patient identified, Emergency Drugs available, Suction available and Patient being monitored Patient Re-evaluated:Patient Re-evaluated prior to induction Oxygen Delivery Method: Circle System Utilized Preoxygenation: Pre-oxygenation with 100% oxygen Induction Type: IV induction Ventilation: Mask ventilation without difficulty Laryngoscope Size: Mac and 4 Grade View: Grade I Tube type: Oral Tube size: 7.5 mm Number of attempts: 1 Airway Equipment and Method: Stylet and Oral airway Placement Confirmation: ETT inserted through vocal cords under direct vision, positive ETCO2 and breath sounds checked- equal and bilateral Secured at: 23 cm Tube secured with: Tape Dental Injury: Teeth and Oropharynx as per pre-operative assessment

## 2021-03-21 NOTE — Interval H&P Note (Signed)
History and Physical Interval Note:  03/21/2021 7:25 AM  Isaac Fletcher  has presented today for surgery, with the diagnosis of left [name] foraminal herniated nucleus pulposus.  The various methods of treatment have been discussed with the patient and family. After consideration of risks, benefits and other options for treatment, the patient has consented to  Procedure(s): LEFT [name] MICRODISCECTOMY (N/A) as a surgical intervention.  The patient's history has been reviewed, patient examined, no change in status, stable for surgery.  I have reviewed the patient's chart and labs.  Questions were answered to the patient's satisfaction.     [name]

## 2021-03-21 NOTE — Transfer of Care (Signed)
Immediate Anesthesia Transfer of Care Note  Patient: Isaac Fletcher  Procedure(s) Performed: LEFT LEFT FOUR-FIVE MICRODISCECTOMY  Patient Location: PACU  Anesthesia Type:General  Level of Consciousness: awake, alert  and oriented  Airway & Oxygen Therapy: Patient Spontanous Breathing  Post-op Assessment: Report given to RN and Post -op Vital signs reviewed and stable  Post vital signs: Reviewed and stable  Last Vitals:  Vitals Value Taken Time  BP 113/66 03/21/21 0920  Temp    Pulse 74 03/21/21 0926  Resp 17 03/21/21 0926  SpO2 94 % 03/21/21 0926  Vitals shown include unvalidated device data.  Last Pain:  Vitals:   03/21/21 0611  TempSrc:   PainSc: 0-No pain         Complications: No notable events documented.

## 2021-03-21 NOTE — Plan of Care (Signed)
Patient admitted post-operatively to surgical floor. No acute distress. [place] interpreter required for communication.   Problem: Safety: Goal: Ability to remain free from injury will improve Outcome: Progressing   Problem: Education: Goal: Knowledge of General Education information will improve Description: Including pain rating scale, medication(s)/side effects and non-pharmacologic comfort measures Outcome: Progressing   Problem: Health Behavior/Discharge Planning: Goal: Ability to manage health-related needs will improve Outcome: Progressing   Problem: Clinical Measurements: Goal: Ability to maintain clinical measurements within normal limits will improve Outcome: Progressing Goal: Will remain free from infection Outcome: Progressing Goal: Diagnostic test results will improve Outcome: Progressing Goal: Respiratory complications will improve Outcome: Progressing Goal: Cardiovascular complication will be avoided Outcome: Progressing   Problem: Activity: Goal: Risk for activity intolerance will decrease Outcome: Progressing   Problem: Nutrition: Goal: Adequate nutrition will be maintained Outcome: Progressing   Problem: Coping: Goal: Level of anxiety will decrease Outcome: Progressing   Problem: Elimination: Goal: Will not experience complications related to bowel motility Outcome: Progressing Goal: Will not experience complications related to urinary retention Outcome: Progressing   Problem: Pain Managment: Goal: General experience of comfort will improve Outcome: Progressing   Problem: Safety: Goal: Ability to remain free from injury will improve Outcome: Progressing   Problem: Skin Integrity: Goal: Risk for impaired skin integrity will decrease Outcome: Progressing

## 2021-03-22 ENCOUNTER — Encounter (HOSPITAL_COMMUNITY): Payer: Self-pay | Admitting: Emergency Medicine

## 2021-03-22 ENCOUNTER — Emergency Department (HOSPITAL_COMMUNITY)
Admission: EM | Admit: 2021-03-22 | Discharge: 2021-03-23 | Disposition: A | Discharge status: Home / Self Care | Payer: Self-pay | Attending: Emergency Medicine | Admitting: Emergency Medicine

## 2021-03-22 DIAGNOSIS — G8918 Other acute postprocedural pain: Secondary | ICD-10-CM | POA: Insufficient documentation

## 2021-03-22 DIAGNOSIS — M549 Dorsalgia, unspecified: Secondary | ICD-10-CM | POA: Insufficient documentation

## 2021-03-22 MED ORDER — OXYCODONE-ACETAMINOPHEN 5-325 MG PO TABS
2.0000 | ORAL_TABLET | Freq: Once | ORAL | Status: AC
  Administered 2021-03-22: 2 via ORAL
  Filled 2021-03-22: qty 2

## 2021-03-22 NOTE — Evaluation (Signed)
Physical Therapy Evaluation and Discharge Patient Details Name: Isaac Fletcher MRN: [id number] DOB: 1981-04-25 Today's Date: 03/22/2021   History of Present Illness  Pt is a 40 yr old male who had L low back pain with  left [name] foraminal herniated nucleus pulposus. Pt s/p left [name] microdiscectomy . PMH includes: hernia repair  Clinical Impression  Patient evaluated by Physical Therapy with no further acute PT needs identified. Pt reporting surgical site and radicular pain to left hip. Overall, moving slowly, but well; ambulating x 400 feet with no assistive device and negotiated 3 steps with a railing. Education provided regarding generalized walking program, spinal precautions, and cryotherapy. All education has been completed and the patient has no further questions. No follow-up Physical Therapy or equipment needs. PT is signing off. Thank you for this referral.     Follow Up Recommendations No PT follow up    Equipment Recommendations  None recommended by PT    Recommendations for Other Services       Precautions / Restrictions Precautions Precautions: Back Precaution Booklet Issued: Yes (comment) Precaution Comments: demonstrated to the pt all precautions in session Restrictions Weight Bearing Restrictions: No      Mobility  Bed Mobility Overal bed mobility: Needs Assistance Bed Mobility: Rolling;Supine to Sit Rolling: Supervision   Supine to sit: Supervision     General bed mobility comments: Sitting EOB upon arrival    Transfers Overall transfer level: Modified independent   Transfers: Sit to/from Stand Sit to Stand: Supervision (hand position)         General transfer comment: slow to rise from lower bed surface  Ambulation/Gait Ambulation/Gait assistance: Modified independent (Device/Increase time) Gait Distance (Feet): 400 Feet Assistive device: None Gait Pattern/deviations: Step-through pattern Gait velocity: decreased   General Gait Details:  Slow but steady pace, guarded posture  Stairs Stairs: Yes Stairs assistance: Modified independent (Device/Increase time) Stair Management: One rail Left Number of Stairs: 3 General stair comments: cues for step by step for pain control  Wheelchair Mobility    Modified Rankin (Stroke Patients Only)       Balance Overall balance assessment: Mild deficits observed, not formally tested                                           Pertinent Vitals/Pain Pain Assessment: 0-10 Pain Score: 7  Pain Location: sx site Pain Descriptors / Indicators: Guarding Pain Intervention(s): Monitored during session    Home Living Family/patient expects to be discharged to:: Private residence Living Arrangements: Other relatives Available Help at Discharge: Family Type of Home: House Home Access: Level entry     Home Layout: One level Home Equipment: None      Prior Function Level of Independence: Independent         Comments: does not work     Higher education careers adviser   Dominant Hand: Right    Extremity/Trunk Assessment   Upper Extremity Assessment Upper Extremity Assessment: Overall WFL for tasks assessed    Lower Extremity Assessment Lower Extremity Assessment: RLE deficits/detail;LLE deficits/detail RLE Deficits / Details: Grossly 5/5 except hip flexion 2/5 (limited by pain) LLE Deficits / Details: Grossly 5/5    Cervical / Trunk Assessment Cervical / Trunk Assessment: Other exceptions Cervical / Trunk Exceptions: s/p microdiscectomy  Communication   Communication: Other (comment) (speaks [place] but was reported ok without calling an interpreter)  Cognition Arousal/Alertness: Awake/alert Behavior  During Therapy: WFL for tasks assessed/performed Overall Cognitive Status: Within Functional Limits for tasks assessed                                        General Comments      Exercises     Assessment/Plan    PT Assessment Patent does  not need any further PT services  PT Problem List         PT Treatment Interventions      PT Goals (Current goals can be found in the Care Plan section)  Acute Rehab PT Goals Patient Stated Goal: to have less pain PT Goal Formulation: All assessment and education complete, DC therapy    Frequency     Barriers to discharge        Co-evaluation               AM-PAC PT "6 Clicks" Mobility  Outcome Measure Help needed turning from your back to your side while in a flat bed without using bedrails?: None Help needed moving from lying on your back to sitting on the side of a flat bed without using bedrails?: None Help needed moving to and from a bed to a chair (including a wheelchair)?: None Help needed standing up from a chair using your arms (e.g., wheelchair or bedside chair)?: None Help needed to walk in hospital room?: None Help needed climbing 3-5 steps with a railing? : None 6 Click Score: 24    End of Session   Activity Tolerance: Patient tolerated treatment well Patient left: in bed;with call bell/phone within reach Nurse Communication: Mobility status PT Visit Diagnosis: Pain;Difficulty in walking, not elsewhere classified (R26.2) Pain - part of body:  (back)    Time: 2951-8841 PT Time Calculation (min) (ACUTE ONLY): 13 min   Charges:   PT Evaluation $PT Eval Low Complexity: 1 Low          Lillia Pauls, PT, DPT Acute Rehabilitation Services Pager [phone] Office [phone]   Norval Morton 03/22/2021, 8:06 AM

## 2021-03-22 NOTE — ED Triage Notes (Signed)
Pt reports having back surgery yesterday.  He is in a large amount of pain b/c he could not get his pain medication.

## 2021-03-22 NOTE — Evaluation (Signed)
Occupational Therapy Evaluation Patient Details Name: Isaac Fletcher MRN: [id number] DOB: [birth year] Today's Date: 03/22/2021    History of Present Illness Pt is a 40 yr old male who had L low back pain with  left [name] foraminal herniated nucleus pulposus. Pt s/p left [name] microdiscectomy . PMH includes: hernia repair   Clinical Impression   Pt admitted with above. Pt lives with family in a single level home. Pt required education and demonstration on how to follow precautions. Pt was able to complete bed mobility with log rolling with cues, sit to stand transfer with supervision and functional mobility with supervision. Pt required supervision for UE and min assist with sock/shoes and supervision with pants and undergarments. In session pt was shown AE to use with dressing to be able to complete as he reports his family will be at work during the day.  Pt currently with functional limitations due to the deficits listed below (see OT Problem List).  Pt will benefit from skilled OT to increase their safety and independence with ADL and functional mobility for ADL to facilitate discharge to venue listed below.      Follow Up Recommendations  Supervision - Intermittent    Equipment Recommendations  Tub/shower seat (long handle reacher, long handle sponge and long handle shoe horn)    Recommendations for Other Services       Precautions / Restrictions Precautions Precautions: Back Precaution Booklet Issued: Yes (comment) Precaution Comments: demonstrated to the pt all precautions in session Restrictions Weight Bearing Restrictions: No      Mobility Bed Mobility Overal bed mobility: Needs Assistance Bed Mobility: Rolling;Supine to Sit Rolling: Supervision   Supine to sit: Supervision     General bed mobility comments: cues to log roll    Transfers Overall transfer level: Needs assistance   Transfers: Sit to/from Stand Sit to Stand: Supervision (hand position)               Balance Overall balance assessment: Mild deficits observed, not formally tested                                         ADL either performed or assessed with clinical judgement   ADL Overall ADL's : Needs assistance/impaired Eating/Feeding: Independent;Sitting   Grooming: Wash/dry hands;Wash/dry face;Supervision/safety;Standing;Cueing for safety   Upper Body Bathing: Supervision/ safety;Cueing for safety;Cueing for sequencing;Sitting;Standing   Lower Body Bathing: Cueing for safety;Cueing for sequencing;Sit to/from stand;Minimal assistance   Upper Body Dressing : Supervision/safety;Sitting;Standing;Cueing for safety   Lower Body Dressing: Cueing for safety;Cueing for sequencing;Sit to/from stand;Minimal assistance   Toilet Transfer: Supervision/safety;Cueing for safety;Cueing for sequencing;Grab bars   Toileting- Clothing Manipulation and Hygiene: Supervision/safety;Cueing for safety;Cueing for sequencing;Sit to/from stand   Tub/ Shower Transfer: Min guard;Cueing for safety;Cueing for sequencing;Grab bars   Functional mobility during ADLs: Supervision/safety;Cueing for safety;Cueing for sequencing General ADL Comments: pt guarding for pain in session     Vision         Perception     Praxis      Pertinent Vitals/Pain Pain Assessment: 0-10 Pain Score: 6  Pain Location: sx site Pain Descriptors / Indicators: Guarding Pain Intervention(s): Limited activity within patient's tolerance;Monitored during session     Hand Dominance Right   Extremity/Trunk Assessment Upper Extremity Assessment Upper Extremity Assessment: Overall WFL for tasks assessed (reposrted some pain with changes in movement)   Lower Extremity Assessment Lower Extremity  Assessment: Defer to PT evaluation   Cervical / Trunk Assessment Cervical / Trunk Assessment: Other exceptions (s/p sx)   Communication Communication Communication: Other (comment) (speaks [place] but was  reported ok without calling an interpreter)   Cognition Arousal/Alertness: Awake/alert Behavior During Therapy: WFL for tasks assessed/performed Overall Cognitive Status: Within Functional Limits for tasks assessed                                     General Comments       Exercises     Shoulder Instructions      Home Living Family/patient expects to be discharged to:: Private residence Living Arrangements: Other relatives Available Help at Discharge: Family Type of Home: House Home Access: Level entry     Home Layout: One level     Bathroom Shower/Tub: Producer, television/film/video: Standard Bathroom Accessibility: Yes   Home Equipment: None          Prior Functioning/Environment Level of Independence: Independent (with pain)                 OT Problem List: Decreased strength;Decreased activity tolerance;Impaired balance (sitting and/or standing);Decreased knowledge of use of DME or AE;Decreased knowledge of precautions;Pain      OT Treatment/Interventions:      OT Goals(Current goals can be found in the care plan section) Acute Rehab OT Goals Patient Stated Goal: to have less pain OT Goal Formulation: With patient Time For Goal Achievement: 03/29/21 Potential to Achieve Goals: Good  OT Frequency:     Barriers to D/C:            Co-evaluation              AM-PAC OT "6 Clicks" Daily Activity     Outcome Measure Help from another person eating meals?: None Help from another person taking care of personal grooming?: None Help from another person toileting, which includes using toliet, bedpan, or urinal?: None Help from another person bathing (including washing, rinsing, drying)?: A Little Help from another person to put on and taking off regular upper body clothing?: None Help from another person to put on and taking off regular lower body clothing?: A Little 6 Click Score: 22   End of Session Equipment Utilized During  Treatment: Gait belt Nurse Communication: Other (comment) (need for shower seat but due to insurance may not qualify)  Activity Tolerance: Patient tolerated treatment well Patient left: in bed;with call bell/phone within reach  OT Visit Diagnosis: Unsteadiness on feet (R26.81);Other abnormalities of gait and mobility (R26.89);Pain Pain - Right/Left: Left Pain - part of body:  (back)                Time: 5035-4656 OT Time Calculation (min): 29 min Charges:  OT General Charges $OT Visit: 1 Visit OT Evaluation $OT Eval Low Complexity: 1 Low OT Treatments $Self Care/Home Management : 8-22 mins  Alphia Moh OTR/L  Acute Rehab Services  [phone] office number [phone] pager number   Alphia Moh 03/22/2021, 7:52 AM

## 2021-03-22 NOTE — ED Provider Notes (Signed)
[facility] EMERGENCY DEPARTMENT Provider Note   CSN: [id number] Arrival date & time: 03/22/21  2037     History Chief Complaint  Patient presents with   Back Pain    Isaac Fletcher is a 40 y.o. male.   Back Pain Associated symptoms: no abdominal pain, no fever, no numbness and no weakness       Isaac Fletcher is a 40 y.o. male, with a history of back surgery, presenting to the ED with postop back pain.  Patient states he underwent back pain (microdiscectomy) yesterday and was discharged today.  He went to fill his prescriptions (gabapentin, methocarbamol, Percocet).  They were able to fill the gabapentin and methocarbamol and told him the Percocet was not quite ready and for him to come back.  When he returned, the pharmacy was closed. He is requesting something for pain management.  Denies fever, weakness, changes in bowel or bladder function, numbness, abdominal pain, or any other complaints.  Past Medical History:  Diagnosis Date   H/O hernia repair     Patient Active Problem List   Diagnosis Date Noted   HNP (herniated nucleus pulposus), lumbar 03/21/2021   Surgery, elective    Protrusion of lumbar intervertebral disc 03/13/2021   Lumbar foraminal stenosis 02/19/2021   Trochanteric bursitis, left hip 02/19/2021    Past Surgical History:  Procedure Laterality Date   HERNIA REPAIR         No family history on file.  Social History   Tobacco Use   Smoking status: Never   Smokeless tobacco: Never  Vaping Use   Vaping Use: Never used  Substance Use Topics   Alcohol use: Never   Drug use: Never    Home Medications Prior to Admission medications   Medication Sig Start Date End Date Taking? Authorizing Provider  gabapentin (NEURONTIN) 300 MG capsule Take one capsule by mouth three times daily. 02/26/21   [name], MD  methocarbamol (ROBAXIN) 500 MG tablet Take 1 tablet (500 mg total) by mouth every 6 (six) hours as needed for  muscle spasms. 03/21/21   Naida Sleight, [name]  oxyCODONE-acetaminophen (PERCOCET) 7.5-325 MG tablet Take 1 tablet by mouth every 4 (four) hours as needed for severe pain. 03/21/21   Naida Sleight, [name]    Allergies    Patient has no known allergies.  Review of Systems   Review of Systems  Constitutional:  Negative for fever.  Gastrointestinal:  Negative for abdominal pain, nausea and vomiting.  Genitourinary:  Negative for difficulty urinating.  Musculoskeletal:  Positive for back pain.  Neurological:  Negative for weakness and numbness.   Physical Exam Updated Vital Signs BP 117/71 (BP Location: Left Arm)   Pulse 82   Temp 98.7 F (37.1 C) (Oral)   Resp 17   SpO2 98%   Physical Exam Vitals and nursing note reviewed.  Constitutional:      General: He is not in acute distress.    Appearance: Normal appearance. He is well-developed. He is not diaphoretic.  HENT:     Head: Normocephalic and atraumatic.  Eyes:     Conjunctiva/sclera: Conjunctivae normal.  Cardiovascular:     Rate and Rhythm: Normal rate and regular rhythm.  Pulmonary:     Effort: Pulmonary effort is normal.  Musculoskeletal:     Cervical back: Neck supple.     Comments: Bandage in place over the surgical site, undisturbed.  No noted swelling or drainage.  Skin:  General: Skin is warm and dry.     Coloration: Skin is not pale.  Neurological:     Mental Status: He is alert.     Comments: Sensation grossly intact to light touch in the lower extremities bilaterally. No saddle anesthesias. Strength 5/5 in the bilateral lower extremities. No noted gait deficit. Coordination intact.  Psychiatric:        Behavior: Behavior normal.    ED Results / Procedures / Treatments   Labs (all labs ordered are listed, but only abnormal results are displayed) Labs Reviewed - No data to display  EKG None  Radiology DG Lumbar Spine 2-3 Views  Result Date: 03/21/2021 CLINICAL DATA:  Left [name] micro discectomy.  EXAM: LUMBAR SPINE - 2-3 VIEW COMPARISON:  Radiographs 10/07/2020 and MRI 11/14/2020. FINDINGS: Two cross-table lateral views of the lumbar spine are submitted from the operating room. Patient has 5 lumbar type vertebral bodies as correlated with prior studies. Image number 1 at 0749 hours demonstrates a posterior localizing needle just inferior to the L4 spinous process. Image number 2 at 0805 hours demonstrates skin spreaders posteriorly at L5-S1 with a surgical instrument directed towards the L5 pedicles. IMPRESSION: Intraoperative localization views as described. Of note, the instruments on the final image are inferior to the [name] disc space and level of primary pathology on MRI. Electronically Signed   By: Carey Bullocks M.D.   On: 03/21/2021 13:34    Procedures Procedures   Medications Ordered in ED Medications  oxyCODONE-acetaminophen (PERCOCET/ROXICET) 5-325 MG per tablet 2 tablet (2 tablets Oral Given 03/22/21 2254)    ED Course  I have reviewed the triage vital signs and the nursing notes.  Pertinent labs & imaging results that were available during my care of the patient were reviewed by me and considered in my medical decision making (see chart for details).    MDM Rules/Calculators/A&P                          Patient requesting pain management since he was unable to pick up his Percocet earlier today. Denies any neurologic deficits. I agreed to provide the patient with a dose of pain medication here in the ED and instructed him to pick up his prescription as soon as possible tomorrow morning. He agreed to this plan.  Final Clinical Impression(s) / ED Diagnoses Final diagnoses:  Postoperative back pain    Rx / DC Orders ED Discharge Orders     None        Concepcion Living 03/22/21 2300    Melene Plan, DO 03/22/21 2315

## 2021-03-22 NOTE — Plan of Care (Signed)
  Problem: Safety: Goal: Ability to remain free from injury will improve Outcome: Adequate for Discharge   Problem: Education: Goal: Knowledge of General Education information will improve Description: Including pain rating scale, medication(s)/side effects and non-pharmacologic comfort measures Outcome: Adequate for Discharge   Problem: Health Behavior/Discharge Planning: Goal: Ability to manage health-related needs will improve Outcome: Adequate for Discharge   Problem: Clinical Measurements: Goal: Ability to maintain clinical measurements within normal limits will improve Outcome: Adequate for Discharge Goal: Will remain free from infection Outcome: Adequate for Discharge Goal: Diagnostic test results will improve Outcome: Adequate for Discharge Goal: Respiratory complications will improve Outcome: Adequate for Discharge Goal: Cardiovascular complication will be avoided Outcome: Adequate for Discharge

## 2021-03-22 NOTE — Care Management (Signed)
Provided with good RX coupons for prescriptions

## 2021-03-22 NOTE — Progress Notes (Signed)
NURSING PROGRESS NOTE  Isaac Fletcher [id number] Discharge Data: 03/22/2021 12:00 PM Attending Provider: [name], MD GBE:[id number], No Pcp Per (Inactive)     Curry E Conwell to be D/C'd Home per MD order.  Discussed with the patient the After Visit Summary and all questions fully answered. All IV's discontinued with no bleeding noted. All belongings returned to patient for patient to take home.   Last Vital Signs:  Blood pressure 127/82, pulse 68, temperature 98 F (36.7 C), temperature source Oral, resp. rate 16, height 6\' 1"  (1.854 m), weight 88.9 kg, SpO2 98 %.  Discharge Medication List Allergies as of 03/22/2021   No Known Allergies      Medication List     STOP taking these medications    cyclobenzaprine 10 MG tablet Commonly known as: FLEXERIL   diclofenac 75 MG EC tablet Commonly known as: VOLTAREN   lidocaine 5 % Commonly known as: Lidoderm   methylPREDNISolone 4 MG tablet Commonly known as: Medrol   predniSONE 10 MG (21) Tbpk tablet Commonly known as: STERAPRED UNI-PAK 21 TAB       TAKE these medications    gabapentin 300 MG capsule Commonly known as: NEURONTIN Take one capsule by mouth three times daily. What changed:  how much to take how to take this when to take this additional instructions   methocarbamol 500 MG tablet Commonly known as: Robaxin Take 1 tablet (500 mg total) by mouth every 6 (six) hours as needed for muscle spasms. What changed:  when to take this reasons to take this   oxyCODONE-acetaminophen 7.5-325 MG tablet Commonly known as: Percocet Take 1 tablet by mouth every 4 (four) hours as needed for severe pain.

## 2021-03-22 NOTE — Progress Notes (Signed)
Patient ID: Isaac Fletcher, male   DOB: 1981/09/01, 40 y.o.   MRN: [id number]   Subjective: 1 Day Post-Op Procedure(s) (LRB): LEFT LEFT FOUR-FIVE MICRODISCECTOMY (N/A) Patient reports pain as mild and moderate.    Objective: Vital signs in last 24 hours: Temp:  [97.2 F (36.2 C)-98.4 F (36.9 C)] 97.9 F (36.6 C) (07/16 0533) Pulse Rate:  [52-80] 57 (07/16 0533) Resp:  [15-23] 18 (07/16 0533) BP: (101-142)/(57-87) 101/67 (07/16 0533) SpO2:  [96 %-100 %] 99 % (07/16 0533)  Intake/Output from previous day: 07/15 0701 - 07/16 0700 In: 400 [I.V.:400] Out: 100 [Blood:100] Intake/Output this shift: No intake/output data recorded.  Recent Labs    03/21/21 0548  HGB 14.6   Recent Labs    03/21/21 0548  WBC 6.4  RBC 6.27*  HCT 47.6  PLT 249   Recent Labs    03/21/21 0548  NA 137  K 4.2  CL 103  CO2 26  BUN 12  CREATININE 1.18  GLUCOSE 113*  CALCIUM 9.7   No results for input(s): LABPT, INR in the last 72 hours.  Neurologically intact DG Lumbar Spine 2-3 Views  Result Date: 03/21/2021 CLINICAL DATA:  Left [name] micro discectomy. EXAM: LUMBAR SPINE - 2-3 VIEW COMPARISON:  Radiographs 10/07/2020 and MRI 11/14/2020. FINDINGS: Two cross-table lateral views of the lumbar spine are submitted from the operating room. Patient has 5 lumbar type vertebral bodies as correlated with prior studies. Image number 1 at 0749 hours demonstrates a posterior localizing needle just inferior to the L4 spinous process. Image number 2 at 0805 hours demonstrates skin spreaders posteriorly at L5-S1 with a surgical instrument directed towards the L5 pedicles. IMPRESSION: Intraoperative localization views as described. Of note, the instruments on the final image are inferior to the [name] disc space and level of primary pathology on MRI. Electronically Signed   By: Carey Bullocks M.D.   On: 03/21/2021 13:34    Assessment/Plan: 1 Day Post-Op Procedure(s) (LRB): LEFT LEFT FOUR-FIVE MICRODISCECTOMY  (N/A) Up with therapy, discharge home today.  Isaac Fletcher 03/22/2021, 7:11 AM

## 2021-03-22 NOTE — Discharge Instructions (Addendum)
Be sure to pick up the pain medicine prescription tomorrow.

## 2021-03-23 ENCOUNTER — Encounter (HOSPITAL_COMMUNITY): Payer: Self-pay | Admitting: Orthopaedic Surgery

## 2021-03-23 NOTE — Anesthesia Postprocedure Evaluation (Signed)
Anesthesia Post Note  Patient: Isaac Fletcher  Procedure(s) Performed: LEFT LEFT FOUR-FIVE MICRODISCECTOMY     Patient location during evaluation: PACU Anesthesia Type: General Level of consciousness: awake Pain management: pain level controlled Vital Signs Assessment: post-procedure vital signs reviewed and stable Respiratory status: spontaneous breathing, nonlabored ventilation, respiratory function stable and patient connected to nasal cannula oxygen Cardiovascular status: blood pressure returned to baseline and stable Postop Assessment: no apparent nausea or vomiting Anesthetic complications: no   No notable events documented.  Last Vitals:  Vitals:   03/22/21 0533 03/22/21 0807  BP: 101/67 127/82  Pulse: (!) 57 68  Resp: 18 16  Temp: 36.6 C 36.7 C  SpO2: 99% 98%    Last Pain:  Vitals:   03/22/21 1154  TempSrc:   PainSc: 8                  Ayansh Feutz P Maddeline Roorda

## 2021-03-25 ENCOUNTER — Ambulatory Visit: Payer: Self-pay | Admitting: Orthopaedic Surgery

## 2021-03-27 ENCOUNTER — Other Ambulatory Visit: Payer: Self-pay

## 2021-03-27 ENCOUNTER — Encounter: Payer: Self-pay | Admitting: Surgery

## 2021-03-27 ENCOUNTER — Ambulatory Visit (INDEPENDENT_AMBULATORY_CARE_PROVIDER_SITE_OTHER): Payer: Self-pay | Admitting: Surgery

## 2021-03-27 VITALS — BP 133/91 | HR 89 | Ht 73.0 in | Wt 196.0 lb

## 2021-03-27 DIAGNOSIS — Z9889 Other specified postprocedural states: Secondary | ICD-10-CM

## 2021-03-31 ENCOUNTER — Other Ambulatory Visit (HOSPITAL_COMMUNITY): Payer: Self-pay | Admitting: Orthopaedic Surgery

## 2021-03-31 ENCOUNTER — Telehealth: Payer: Self-pay | Admitting: Orthopaedic Surgery

## 2021-03-31 ENCOUNTER — Telehealth: Payer: Self-pay

## 2021-03-31 NOTE — Telephone Encounter (Signed)
Pt needs a refill on oxycodone and robaxin. Would like call when ready.   CB [phone]

## 2021-03-31 NOTE — Telephone Encounter (Signed)
Duplicate message in chart. Dr. Ophelia Charter is in surgery. Awaiting his response.

## 2021-03-31 NOTE — Progress Notes (Signed)
ercoc

## 2021-03-31 NOTE — Telephone Encounter (Signed)
Please advise 

## 2021-03-31 NOTE — Telephone Encounter (Signed)
Pt called and would like a refill on his methocarbamol and his oxycodone

## 2021-03-31 NOTE — Telephone Encounter (Signed)
I called patient and advised. 

## 2021-04-01 ENCOUNTER — Other Ambulatory Visit: Payer: Self-pay | Admitting: Orthopaedic Surgery

## 2021-04-01 ENCOUNTER — Telehealth: Payer: Self-pay | Admitting: Orthopaedic Surgery

## 2021-04-01 MED ORDER — OXYCODONE-ACETAMINOPHEN 5-325 MG PO TABS: 1.0000 | ORAL_TABLET | Freq: Four times a day (QID) | ORAL | 0 refills | Status: DC | PRN

## 2021-04-01 MED ORDER — METHOCARBAMOL 500 MG PO TABS: 500.0000 mg | ORAL_TABLET | Freq: Four times a day (QID) | ORAL | 0 refills | Status: DC

## 2021-04-01 NOTE — Telephone Encounter (Signed)
Patient's brother Cheral Marker called advised patient Rx refilled Methocarbamol  and Oxycodone. Patient uses [facility] on [address].   The number to contact patient is [phone]

## 2021-04-01 NOTE — Telephone Encounter (Signed)
Pt would like a call from someone   CB [phone]

## 2021-04-01 NOTE — Progress Notes (Signed)
New Rx for percocet # 30 and robaxin # 40 sent in

## 2021-04-01 NOTE — Telephone Encounter (Signed)
Please see message below. Message from yesterday stated that you had sent in more Percocet and Robaxin, however, it is not listed in the med module and did not go to pharmacy. Could you please resend?

## 2021-04-01 NOTE — Telephone Encounter (Signed)
I called patient and advised. 

## 2021-04-01 NOTE — Telephone Encounter (Signed)
Duplicate message in chart. Patient's refills did not get sent to the pharmacy yesterday. Dr. Ophelia Charter resent those and medication module shows receipt confirmed by pharmacy. I called patient and advised.

## 2021-04-07 NOTE — Progress Notes (Signed)
   Post-Op Visit Note   Patient: Isaac Fletcher           Date of Birth: [birth year]           MRN: [id number] Visit Date: 03/27/2021 PCP: Patient, No Pcp Per (Inactive)   Assessment & Plan:  Chief Complaint:  Chief Complaint  Patient presents with   Lower Back - Routine Post Op    03/21/2021 Left [name] microdiscectomy  Patient states that he is doing well.  Preop leg pain gone. Visit Diagnoses:  1. S/P lumbar microdiscectomy     Plan: Patient doing very well.  Advise no twisting, bending, lifting.  Gradually increase walking distances.  No driving.  Follow-up in 4 weeks for recheck.  Follow-Up Instructions: Return in about 4 weeks (around 04/24/2021) for WITH DR YATES FOR POSTOP CHECK.   Orders:  No orders of the defined types were placed in this encounter.  No orders of the defined types were placed in this encounter.   Imaging: No results found.  PMFS History: Patient Active Problem List   Diagnosis Date Noted   HNP (herniated nucleus pulposus), lumbar 03/21/2021   Surgery, elective    Protrusion of lumbar intervertebral disc 03/13/2021   Lumbar foraminal stenosis 02/19/2021   Trochanteric bursitis, left hip 02/19/2021   Past Medical History:  Diagnosis Date   H/O hernia repair     History reviewed. No pertinent family history.  Past Surgical History:  Procedure Laterality Date   HERNIA REPAIR     LUMBAR LAMINECTOMY N/A 03/21/2021   Procedure: LEFT LEFT FOUR-FIVE MICRODISCECTOMY;  Surgeon: [name], MD;  Location: [facility] OR;  Service: Orthopedics;  Laterality: N/A;   Social History   Occupational History   Not on file  Tobacco Use   Smoking status: Never   Smokeless tobacco: Never  Vaping Use   Vaping Use: Never used  Substance and Sexual Activity   Alcohol use: Never   Drug use: Never   Sexual activity: Not on file   Exam Very pleasant black male alert and oriented in no acute distress.  Wound looks good.  New Steri-Strips applied.  No drainage or  signs infection.  Neurologically intact.

## 2021-04-08 ENCOUNTER — Encounter: Payer: Self-pay | Admitting: Orthopaedic Surgery

## 2021-04-08 ENCOUNTER — Ambulatory Visit (INDEPENDENT_AMBULATORY_CARE_PROVIDER_SITE_OTHER): Payer: Self-pay | Admitting: Orthopaedic Surgery

## 2021-04-08 ENCOUNTER — Other Ambulatory Visit: Payer: Self-pay

## 2021-04-08 DIAGNOSIS — Z9889 Other specified postprocedural states: Secondary | ICD-10-CM

## 2021-04-08 MED ORDER — METHOCARBAMOL 500 MG PO TABS: 500.0000 mg | ORAL_TABLET | Freq: Four times a day (QID) | ORAL | 0 refills | Status: DC | PRN

## 2021-04-08 MED ORDER — TRAMADOL HCL 50 MG PO TABS: 50.0000 mg | ORAL_TABLET | Freq: Four times a day (QID) | ORAL | 0 refills | Status: DC | PRN

## 2021-04-08 NOTE — Progress Notes (Signed)
   Post-Op Visit Note   Patient: Isaac Fletcher           Date of Birth: March 14, 1981           MRN: [id number] Visit Date: 04/08/2021 PCP: Patient, No Pcp Per (Inactive)   Assessment & Plan: Incision looks good Band-Aids applied.  Subcuticular closure with Dermabond was placed at surgery.  Progressive walking program to 2 miles a day return 5 weeks.  We will discuss work resumption activities at that time.  Chief Complaint:  Chief Complaint  Patient presents with   Lower Back - Follow-up    03/21/2021 Left [name] microdiscectomy   Visit Diagnoses:  1. Status post lumbar microdiscectomy     Plan: Discontinue Percocet Ultram for pain.  Final prescription for Robaxin walking program return in 5 weeks.  Patient is very happy with the surgical result.  Follow-Up Instructions: No follow-ups on file.   Orders:  No orders of the defined types were placed in this encounter.  Meds ordered this encounter  Medications   traMADol (ULTRAM) 50 MG tablet    Sig: Take 1 tablet (50 mg total) by mouth every 6 (six) hours as needed.    Dispense:  30 tablet    Refill:  0   methocarbamol (ROBAXIN) 500 MG tablet    Sig: Take 1 tablet (500 mg total) by mouth every 6 (six) hours as needed for muscle spasms.    Dispense:  30 tablet    Refill:  0    Imaging: No results found.  PMFS History: Patient Active Problem List   Diagnosis Date Noted   Status post lumbar microdiscectomy 04/08/2021   Surgery, elective    Lumbar foraminal stenosis 02/19/2021   Trochanteric bursitis, left hip 02/19/2021   Past Medical History:  Diagnosis Date   H/O hernia repair     No family history on file.  Past Surgical History:  Procedure Laterality Date   HERNIA REPAIR     LUMBAR LAMINECTOMY N/A 03/21/2021   Procedure: LEFT LEFT FOUR-FIVE MICRODISCECTOMY;  Surgeon: [name], MD;  Location: [facility] OR;  Service: Orthopedics;  Laterality: N/A;   Social History   Occupational History   Not on file  Tobacco  Use   Smoking status: Never   Smokeless tobacco: Never  Vaping Use   Vaping Use: Never used  Substance and Sexual Activity   Alcohol use: Never   Drug use: Never   Sexual activity: Not on file

## 2021-04-08 NOTE — Discharge Summary (Signed)
Patient ID: Isaac Fletcher MRN: [id number] DOB/AGE: [birth year] 40 y.o.  Admit date: 03/21/2021 Discharge date: 03/22/2021  Admission Diagnoses:  Active Problems:   Protrusion of lumbar intervertebral disc   HNP (herniated nucleus pulposus), lumbar   Discharge Diagnoses:  Active Problems:   Protrusion of lumbar intervertebral disc   HNP (herniated nucleus pulposus), lumbar  status post Procedure(s): LEFT LEFT FOUR-FIVE MICRODISCECTOMY  Past Medical History:  Diagnosis Date   H/O hernia repair     Surgeries: Procedure(s): LEFT LEFT FOUR-FIVE MICRODISCECTOMY on 03/21/2021   Consultants:   Discharged Condition: Improved  Hospital Course: Isaac Fletcher is an 40 y.o. male who was admitted 03/21/2021 for operative treatment of left [name] HNP. Patient failed conservative treatments (please see the history and physical for the specifics) and had severe unremitting pain that affects sleep, daily activities and work/hobbies. After pre-op clearance, the patient was taken to the operating room on 03/21/2021 and underwent  Procedure(s): LEFT LEFT FOUR-FIVE MICRODISCECTOMY.    Patient was given perioperative antibiotics:  Anti-infectives (From admission, onward)    Start     Dose/Rate Route Frequency Ordered Stop   03/21/21 0600  ceFAZolin (ANCEF) IVPB 2g/100 mL premix        2 g 200 mL/hr over 30 Minutes Intravenous On call to O.R. 03/21/21 [id number] 03/21/21 0746        Patient was given sequential compression devices and early ambulation to prevent DVT.   Patient benefited maximally from hospital stay and there were no complications. At the time of discharge, the patient was urinating/moving their bowels without difficulty, tolerating a regular diet, pain is controlled with oral pain medications and they have been cleared by PT/OT.   Recent vital signs: No data found.   Recent laboratory studies: No results for input(s): WBC, HGB, HCT, PLT, NA, K, CL, CO2, BUN, CREATININE, GLUCOSE,  INR, CALCIUM in the last 72 hours.  Invalid input(s): PT, 2   Discharge Medications:   Allergies as of 03/22/2021   No Known Allergies      Medication List     STOP taking these medications    cyclobenzaprine 10 MG tablet Commonly known as: FLEXERIL   diclofenac 75 MG EC tablet Commonly known as: VOLTAREN   lidocaine 5 % Commonly known as: Lidoderm   methylPREDNISolone 4 MG tablet Commonly known as: Medrol   predniSONE 10 MG (21) Tbpk tablet Commonly known as: STERAPRED UNI-PAK 21 TAB       TAKE these medications    gabapentin 300 MG capsule Commonly known as: NEURONTIN Take one capsule by mouth three times daily. What changed:  how much to take how to take this when to take this additional instructions   methocarbamol 500 MG tablet Commonly known as: Robaxin Take 1 tablet (500 mg total) by mouth every 6 (six) hours as needed for muscle spasms. What changed:  when to take this reasons to take this   oxyCODONE-acetaminophen 7.5-325 MG tablet Commonly known as: Percocet Take 1 tablet by mouth every 4 (four) hours as needed for severe pain.        Diagnostic Studies: DG Lumbar Spine 2-3 Views  Result Date: 03/21/2021 CLINICAL DATA:  Left [name] micro discectomy. EXAM: LUMBAR SPINE - 2-3 VIEW COMPARISON:  Radiographs 10/07/2020 and MRI 11/14/2020. FINDINGS: Two cross-table lateral views of the lumbar spine are submitted from the operating room. Patient has 5 lumbar type vertebral bodies as correlated with prior studies. Image number 1 at 0749 hours demonstrates a posterior localizing  needle just inferior to the L4 spinous process. Image number 2 at 0805 hours demonstrates skin spreaders posteriorly at L5-S1 with a surgical instrument directed towards the L5 pedicles. IMPRESSION: Intraoperative localization views as described. Of note, the instruments on the final image are inferior to the [name] disc space and level of primary pathology on MRI. Electronically  Signed   By: Carey Bullocks M.D.   On: 03/21/2021 13:34    Discharge Instructions     Incentive spirometry RT   Complete by: As directed         Follow-up Information     Naida Sleight, [name] Follow up in 1 week(s).   Specialties: Physician Assistant, Orthopedic Surgery Contact information: [address] [place] [address] [phone]                 Discharge Plan:  discharge to home  Disposition:     Signed: Zonia Kief  04/08/2021, 2:16 PM

## 2021-04-16 ENCOUNTER — Other Ambulatory Visit: Payer: Self-pay | Admitting: Orthopaedic Surgery

## 2021-04-19 ENCOUNTER — Other Ambulatory Visit: Payer: Self-pay | Admitting: Orthopaedic Surgery

## 2021-04-21 ENCOUNTER — Telehealth: Payer: Self-pay | Admitting: Orthopaedic Surgery

## 2021-04-21 MED ORDER — TRAMADOL HCL 50 MG PO TABS: 50.0000 mg | ORAL_TABLET | Freq: Four times a day (QID) | ORAL | 0 refills | Status: DC | PRN

## 2021-04-21 NOTE — Telephone Encounter (Signed)
Please advise 

## 2021-04-21 NOTE — Telephone Encounter (Signed)
Patient called. Says the pharmacy does not have the RX for his Robaxin. Would like a refill called in. His call back number is [phone]

## 2021-04-21 NOTE — Telephone Encounter (Signed)
Per Dr. Ophelia Charter, ok to call to pharmacy #[id number] only, tell patient this will be his last pain medication refill.  Rx called to pharmacy voicemail.  I called patient and advised.

## 2021-04-21 NOTE — Telephone Encounter (Signed)
I called rx to pharmacy. Tramadol 50mg  #15 no refills.

## 2021-04-21 NOTE — Telephone Encounter (Signed)
I called pharmacy because it shows rx was received on 04/17/2021.  They state that patient has picked this up already. I called patient to advise. He is requesting refill on pain medication, not muscle relaxer.  Please advise.

## 2021-04-25 ENCOUNTER — Other Ambulatory Visit: Payer: Self-pay

## 2021-04-25 ENCOUNTER — Encounter: Payer: Self-pay | Admitting: Orthopaedic Surgery

## 2021-04-25 ENCOUNTER — Ambulatory Visit (INDEPENDENT_AMBULATORY_CARE_PROVIDER_SITE_OTHER): Payer: Self-pay | Admitting: Orthopaedic Surgery

## 2021-04-25 VITALS — BP 139/98 | HR 75 | Ht 73.0 in | Wt 196.0 lb

## 2021-04-25 DIAGNOSIS — Z9889 Other specified postprocedural states: Secondary | ICD-10-CM

## 2021-04-25 NOTE — Progress Notes (Signed)
   Post-Op Visit Note   Patient: Isaac Fletcher           Date of Birth: Oct 06, 1980           MRN: [id number] Visit Date: 04/25/2021 PCP: Patient, No Pcp Per (Inactive)   Assessment & Plan: 5 weeks post left [name] microdiscectomy.  Patient still feels some "contractions" in his left lower extremity.  Tends to bother him more in the morning.  He has been walking as we discussed.  We weaned his pain medication originally stronger and last prescription was tramadol.  He could switch to some ibuprofen that he can use as needed.  We discussed he will continue to get improvement in his leg as he walks.  After 1 week he can begin doing some bending and resume more normal activity.  I will check him again in 3 months.  After 1 week he is able to look for some work activity if he so desires.  Chief Complaint:  Chief Complaint  Patient presents with   Lower Back - Follow-up    03/21/2021 Left [name] microdiscectomy   Visit Diagnoses:  1. Status post lumbar microdiscectomy     Plan: Recheck 3 months.  Follow-Up Instructions: No follow-ups on file.   Orders:  No orders of the defined types were placed in this encounter.  No orders of the defined types were placed in this encounter.   Imaging: No results found.  PMFS History: Patient Active Problem List   Diagnosis Date Noted   Status post lumbar microdiscectomy 04/08/2021   Surgery, elective    Lumbar foraminal stenosis 02/19/2021   Trochanteric bursitis, left hip 02/19/2021   Past Medical History:  Diagnosis Date   H/O hernia repair     No family history on file.  Past Surgical History:  Procedure Laterality Date   HERNIA REPAIR     LUMBAR LAMINECTOMY N/A 03/21/2021   Procedure: LEFT LEFT FOUR-FIVE MICRODISCECTOMY;  Surgeon: [name], MD;  Location: [facility] OR;  Service: Orthopedics;  Laterality: N/A;   Social History   Occupational History   Not on file  Tobacco Use   Smoking status: Never   Smokeless tobacco: Never   Vaping Use   Vaping Use: Never used  Substance and Sexual Activity   Alcohol use: Never   Drug use: Never   Sexual activity: Not on file

## 2021-05-02 ENCOUNTER — Other Ambulatory Visit: Payer: Self-pay | Admitting: Orthopaedic Surgery

## 2021-06-10 ENCOUNTER — Ambulatory Visit (INDEPENDENT_AMBULATORY_CARE_PROVIDER_SITE_OTHER): Payer: Self-pay | Admitting: Orthopaedic Surgery

## 2021-06-10 ENCOUNTER — Encounter: Payer: Self-pay | Admitting: Orthopaedic Surgery

## 2021-06-10 ENCOUNTER — Other Ambulatory Visit: Payer: Self-pay

## 2021-06-10 VITALS — BP 140/84 | Ht 73.0 in | Wt 196.0 lb

## 2021-06-10 DIAGNOSIS — Z9889 Other specified postprocedural states: Secondary | ICD-10-CM

## 2021-06-10 NOTE — Progress Notes (Signed)
   Office Visit Note   Patient: Isaac Fletcher           Date of Birth: [birth year]           MRN: [id number] Visit Date: 06/10/2021              Requested by: No referring provider defined for this encounter. PCP: Patient, No Pcp Per (Inactive)   Assessment & Plan: Visit Diagnoses:  1. Status post lumbar microdiscectomy     Plan: Post microdiscectomy with the residual feelings of his leg being heavy and occasional spontaneous muscle fasciculations.  We discussed this is not unusual after it had prolonged nerve compression.  He can continue doing walking activities.  Work activity should avoid work-related do repetitive heavy lifting turning twisting type activities which would load his lumbar spine more.  He can return on an as-needed basis. Follow-Up Instructions: Return if symptoms worsen or fail to improve.   Orders:  No orders of the defined types were placed in this encounter.  No orders of the defined types were placed in this encounter.     Procedures: No procedures performed   Clinical Data: No additional findings.   Subjective: Chief Complaint  Patient presents with   Lower Back - Pain    03/21/2021 Left [name] microdiscectomy    HPI 40 year old male returns now almost 3 months post L4/5 microdiscectomy on the left.  He states sometimes his leg feels heavy.  He is walking for 2 hours a day several miles.  He states sometimes he has some jumping in his quad muscle sometimes some spasm in his calf with spontaneous movement of the muscles.  It will not prevent him from walking.  No bowel bladder symptoms.  No recurrence of the severe back and leg pain that he had preoperatively.  Review of Systems unchanged   Objective: Vital Signs: BP 140/84   Ht 6\' 1"  (1.854 m)   Wt 196 lb (88.9 kg)   BMI 25.86 kg/m   Physical Exam unchanged  Ortho Exam lumbar incision well-healed negative straight leg raising 90 degrees.  Anterior tib EHL is strong.  Gastrocsoleus is  strong.  He gets from sitting standing easily.  Specialty Comments:  No specialty comments available.  Imaging: No results found.   PMFS History: Patient Active Problem List   Diagnosis Date Noted   Status post lumbar microdiscectomy 04/08/2021   Surgery, elective    Lumbar foraminal stenosis 02/19/2021   Trochanteric bursitis, left hip 02/19/2021   Past Medical History:  Diagnosis Date   H/O hernia repair     No family history on file.  Past Surgical History:  Procedure Laterality Date   HERNIA REPAIR     LUMBAR LAMINECTOMY N/A 03/21/2021   Procedure: LEFT LEFT FOUR-FIVE MICRODISCECTOMY;  Surgeon: 03/23/2021, MD;  Location: [facility] OR;  Service: Orthopedics;  Laterality: N/A;   Social History   Occupational History   Not on file  Tobacco Use   Smoking status: Never   Smokeless tobacco: Never  Vaping Use   Vaping Use: Never used  Substance and Sexual Activity   Alcohol use: Never   Drug use: Never   Sexual activity: Not on file

## 2021-06-16 ENCOUNTER — Telehealth: Payer: Self-pay | Admitting: Orthopaedic Surgery

## 2021-06-16 NOTE — Telephone Encounter (Signed)
Patient called. Says he would like to know if he can have a MRI. His call back number is [phone]

## 2021-06-17 ENCOUNTER — Other Ambulatory Visit: Payer: Self-pay

## 2021-06-17 DIAGNOSIS — M4807 Spinal stenosis, lumbosacral region: Secondary | ICD-10-CM

## 2021-06-17 NOTE — Progress Notes (Signed)
Me lumbar

## 2021-06-17 NOTE — Telephone Encounter (Signed)
Patient aware order sent for MRI and to call back and make FU to go over scan

## 2021-06-25 ENCOUNTER — Telehealth: Payer: Self-pay | Admitting: Orthopaedic Surgery

## 2021-07-06 ENCOUNTER — Ambulatory Visit
Admission: RE | Admit: 2021-07-06 | Discharge: 2021-07-06 | Disposition: A | Discharge status: Home / Self Care | Payer: Self-pay | Source: Ambulatory Visit | Attending: Orthopaedic Surgery | Admitting: Orthopaedic Surgery

## 2021-07-06 ENCOUNTER — Other Ambulatory Visit: Payer: Self-pay

## 2021-07-06 DIAGNOSIS — M4807 Spinal stenosis, lumbosacral region: Secondary | ICD-10-CM

## 2021-07-06 IMAGING — MR MR LUMBAR SPINE WO/W CM
4 of 7 series · 23 of 48 positions shown · IV contrast (18 ml multihance)
Comparison: MRI of the lumbar spine [DATE].

CLINICAL DATA: Spinal stenosis, lumbosacral increased back pain;
has had surgery on back

EXAM:
MRI LUMBAR SPINE WITHOUT AND WITH CONTRAST
TECHNIQUE: Multiplanar and multiecho pulse sequences of the lumbar spine were
obtained without and with intravenous contrast.
CONTRAST:  18mL MULTIHANCE GADOBENATE DIMEGLUMINE 529 MG/ML IV SOLN

[Series 3: T1 · sagittal · 4.0mm · 0.88mm/px · 3 of 15 slices shown (1 of 2)]
[im 1/15]
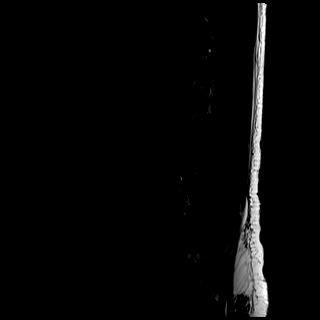
[im 8/15]
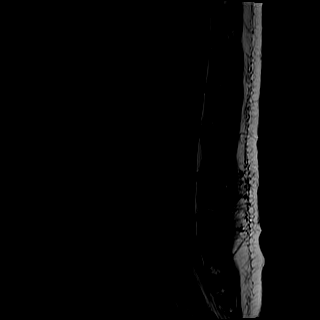
[im 15/15]
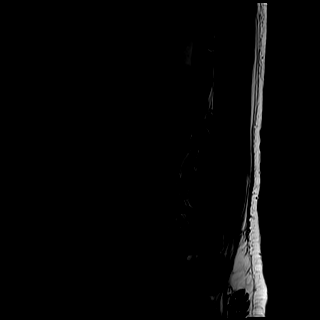

[Series 5: T2 · axial · 4.0mm · 0.39mm/px · z∈[-116,+110]mm · 11 of 45 slices shown (1 of 2)]
[im 1/45]
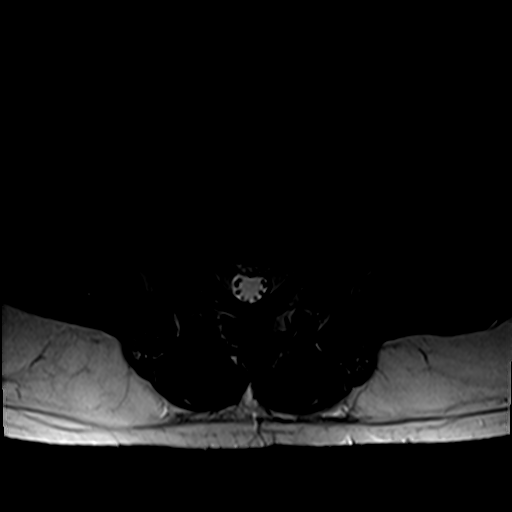
[im 5/45]
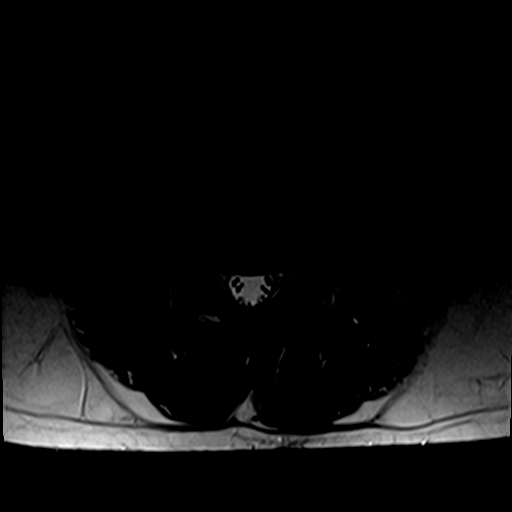
[im 9/45]
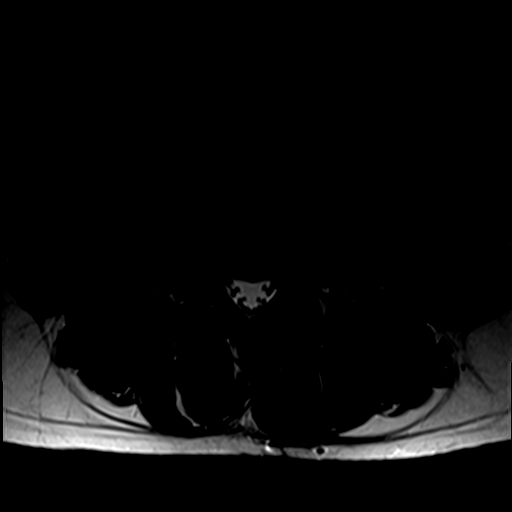
[im 14/45]
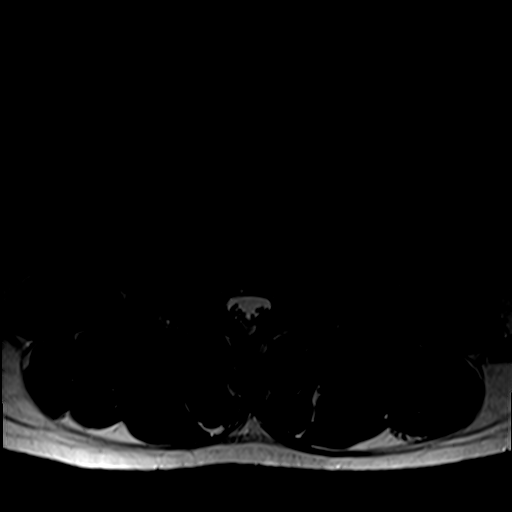
[im 18/45]
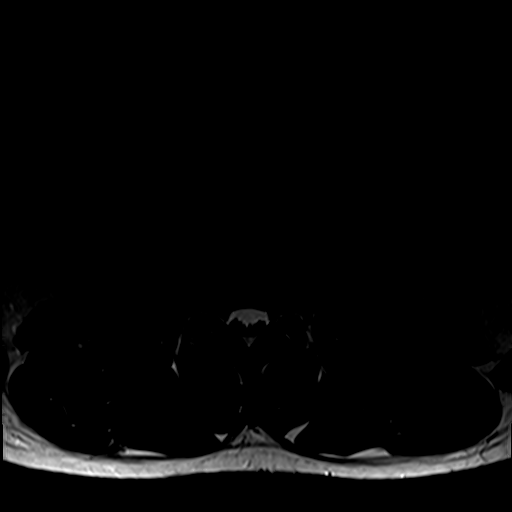
[im 23/45]
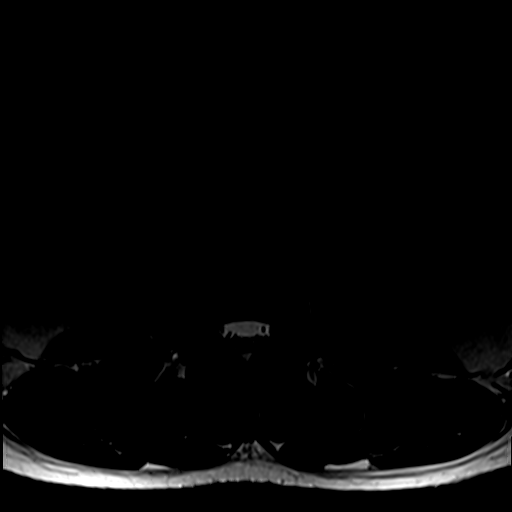
[im 27/45]
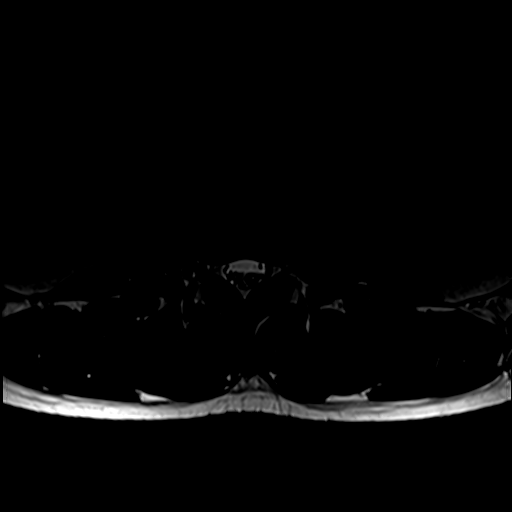
[im 31/45]
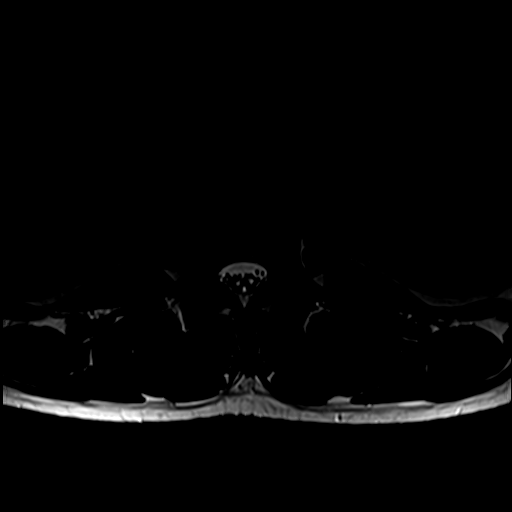
[im 36/45]
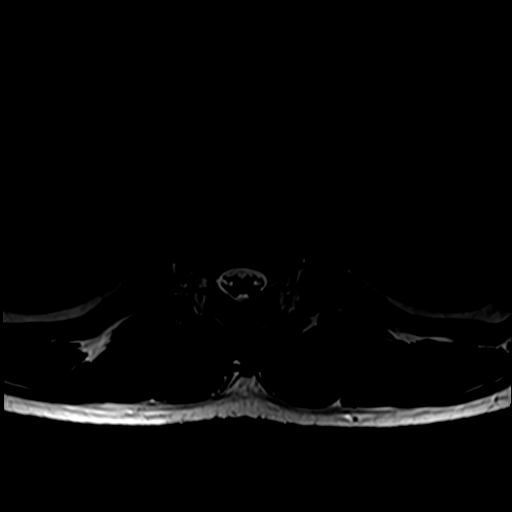
[im 40/45]
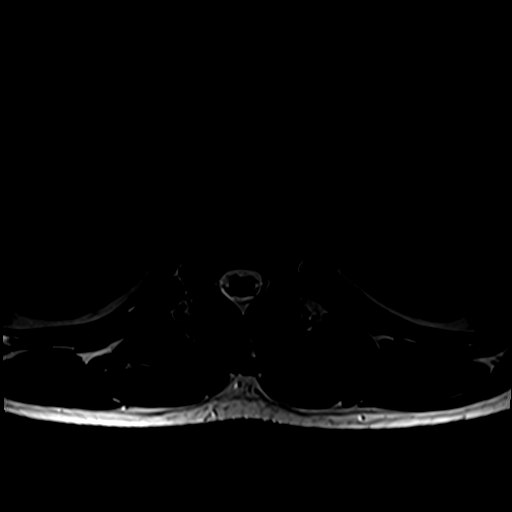
[im 45/45]
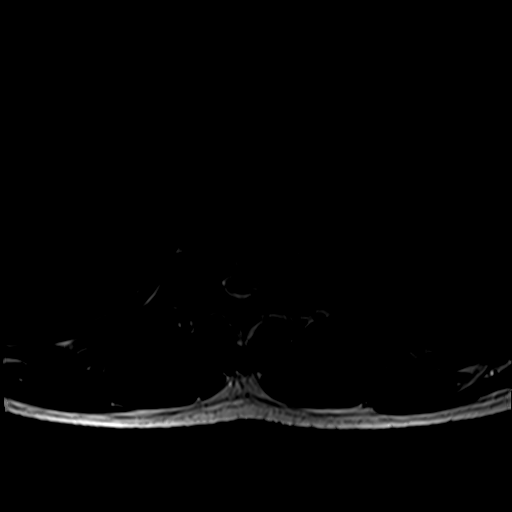

[Series 6: T1 · axial · 4.0mm · 0.39mm/px · z∈[-116,+86]mm · 5 of 45 slices shown (2 of 2)]
[im 1/45]
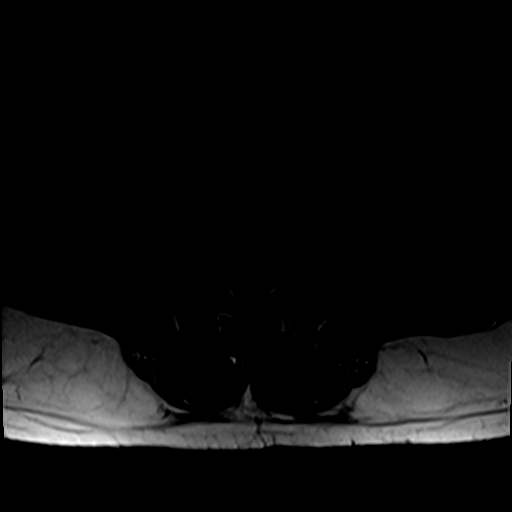
[im 5/45]
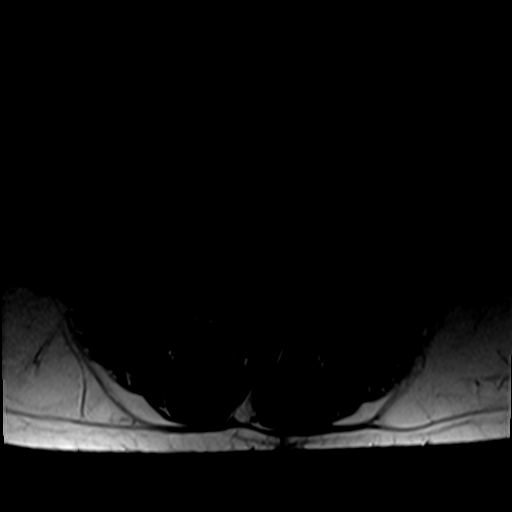
[im 9/45]
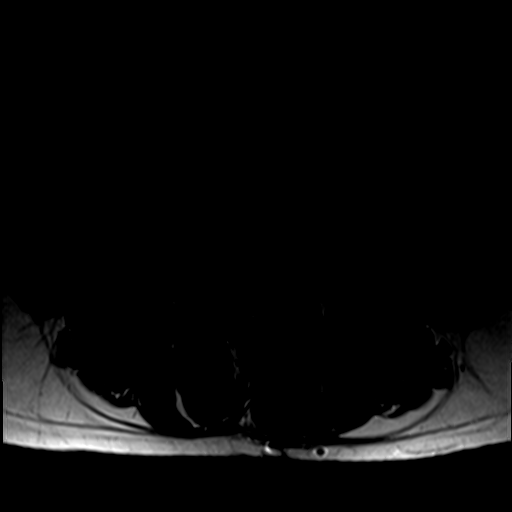
[im 23/45]
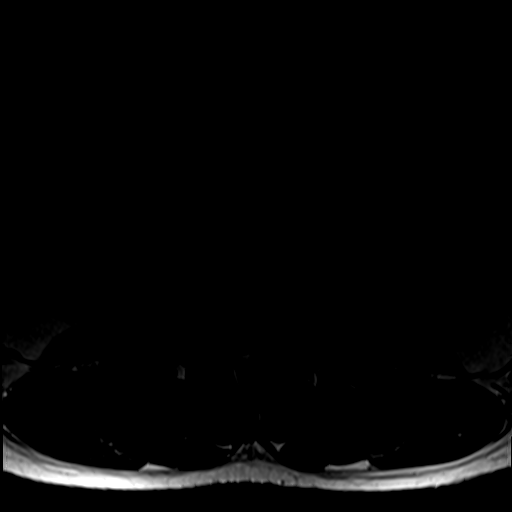
[im 40/45]
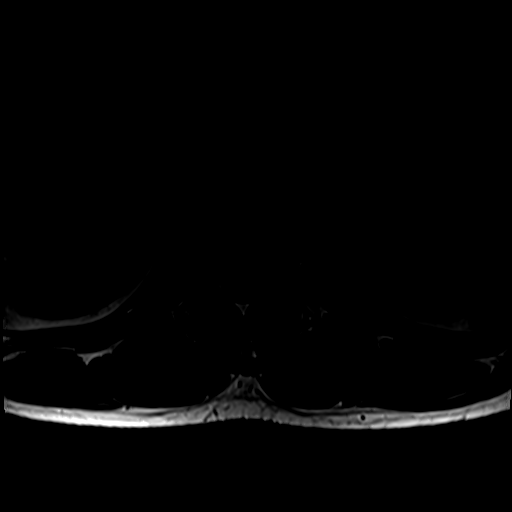

[Series 7: T2 · sagittal · 4.0mm · 1.09mm/px · 4 of 15 slices shown (2 of 2)]
[im 1/15]
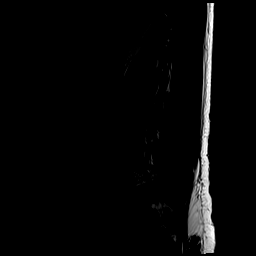
[im 5/15]
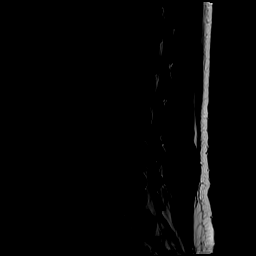
[im 10/15]
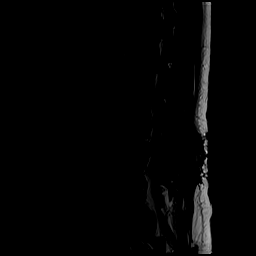
[im 15/15]
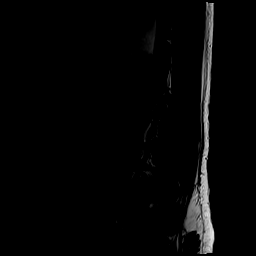

[23 of 48 positions shown; findings below may reference images not displayed]

FINDINGS: Segmentation: Standard segmentation is seen. The inferior-most fully
formed intervertebral disc is labeled L5-S1. Same numbering as on
the prior MRI.

Alignment: Straightening of the normal lumbar lordosis. No
substantial sagittal subluxation.

Vertebrae: Congenitally short pedicles. Vertebral body heights are
maintained. No focal marrow edema to suggest acute fracture or
discitis/osteomyelitis.

Conus medullaris and cauda equina: Conus extends to the L2 level.
Conus and cauda equina appear normal. No abnormal enhancement of the
conus or cauda equina.

Paraspinal and other soft tissues: Postoperative changes in the
lower left lumbar spine. Otherwise, unremarkable.

Disc levels:

T12-L1: No significant disc protrusion, foraminal stenosis, or canal
stenosis.

L1-L2: No significant disc protrusion, foraminal stenosis, or canal
stenosis.

L2-L3: No significant disc protrusion, foraminal stenosis, or canal
stenosis.

L3-L4: No significant disc protrusion, foraminal stenosis, or canal
stenosis.

L4-L5: Disc height loss and desiccation. Broad disc bulge. Enhancing
scar tissue in the posterolateral left canal and left foramen,
compatible with sequela interval micro discectomy. This scar tissue
limits assessment; however, left foraminal stenosis is likely
improved without evidence of recurrent disc herniation. No
significant canal stenosis. Similar moderate right foraminal
stenosis.

L5-S1: Mild disc bulging with moderate right and mild left foraminal
stenosis. No significant canal stenosis.
IMPRESSION: 1. Interval left L4-L5 micro discectomy. Scar tissue in the left
foramen and lateral canal limits assessment, but suspect improved
foraminal stenosis without evidence of recurrent herniation. Similar
moderate right foraminal stenosis at this level.
2. At L5-S1, similar moderate right and mild left foraminal
stenosis.

## 2021-07-06 MED ORDER — GADOBENATE DIMEGLUMINE 529 MG/ML IV SOLN
18.0000 mL | Freq: Once | INTRAVENOUS | Status: AC | PRN
  Administered 2021-07-06: 18 mL via INTRAVENOUS

## 2021-07-15 ENCOUNTER — Encounter: Payer: Self-pay | Admitting: Orthopaedic Surgery

## 2021-07-15 ENCOUNTER — Other Ambulatory Visit: Payer: Self-pay

## 2021-07-15 ENCOUNTER — Ambulatory Visit (INDEPENDENT_AMBULATORY_CARE_PROVIDER_SITE_OTHER): Payer: Self-pay | Admitting: Orthopaedic Surgery

## 2021-07-15 VITALS — BP 133/90 | HR 69 | Ht 73.0 in | Wt 196.0 lb

## 2021-07-15 DIAGNOSIS — Z9889 Other specified postprocedural states: Secondary | ICD-10-CM

## 2021-07-15 MED ORDER — GABAPENTIN 300 MG PO CAPS: ORAL_CAPSULE | ORAL | 2 refills | Status: DC

## 2021-07-15 NOTE — Progress Notes (Signed)
Office Visit Note   Patient: Isaac Fletcher           Date of Birth: 16-May-1981           MRN: [id number] Visit Date: 07/15/2021              Requested by: No referring provider defined for this encounter. PCP: Patient, No Pcp Per (Inactive)   Assessment & Plan: Visit Diagnoses:  1. Status post lumbar microdiscectomy     Plan: I reviewed the MRI scan given copy of the report.  We compared preoperative images where he had nerve compression to prior ones which shows some scar tissue but no recurrent disc.  We discussed that he could look for a job where he does not have to do heavy lifting since this is less likely to bother his back symptoms with degenerative disc that are present.  I discussed that I do not think fusion is indicated at this point.  Scan shows no nerve compression he has no nerve root tension signs and no isolated motor weakness.  We will renew his Neurontin.  He has some congenital short pedicles and prior to surgery is having significant problems standing and walking.  He is significantly improved from that state but has had trouble understanding that after surgery he would not return to the state that he was when he was very young and asymptomatic.  We discussed claudication symptoms radiculopathy symptoms.  Follow-Up Instructions: No follow-ups on file.   Orders:  No orders of the defined types were placed in this encounter.  Meds ordered this encounter  Medications   gabapentin (NEURONTIN) 300 MG capsule    Sig: Take one capsule by mouth three times daily.    Dispense:  90 capsule    Refill:  2      Procedures: No procedures performed   Clinical Data: No additional findings.   Subjective: Chief Complaint  Patient presents with   Lower Back - Pain, Follow-up    MRI review    HPI 40 year old male returns post left [name] microdiscectomy.  He states he is really not feeling so good today.  He has some discomfort in his back and states he also notes  that in his leg he still has some numbness.  Sometimes thigh muscles will jump for a period time.  He states he is having some trouble sleeping.  He has no problems walking up stairs but states his leg feels heavy.  He is walking 2 miles a day not having any limping no bowel bladder symptoms no chills or fever.  MRI scan shows some scar tissue.  Improved foraminal stenosis no evidence of recurrent disc herniation.  L5-S1 shows some moderate right and mild left foraminal narrowing.  Review of Systems updated unchanged.   Objective: Vital Signs: BP 133/90   Pulse 69   Ht 6\' 1"  (1.854 m)   Wt 196 lb (88.9 kg)   BMI 25.86 kg/m   Physical Exam Constitutional:      Appearance: He is well-developed.  HENT:     Head: Normocephalic and atraumatic.     Right Ear: External ear normal.     Left Ear: External ear normal.  Eyes:     Pupils: Pupils are equal, round, and reactive to light.  Neck:     Thyroid: No thyromegaly.     Trachea: No tracheal deviation.  Cardiovascular:     Rate and Rhythm: Normal rate.  Pulmonary:     Effort:  Pulmonary effort is normal.     Breath sounds: No wheezing.  Abdominal:     General: Bowel sounds are normal.     Palpations: Abdomen is soft.  Musculoskeletal:     Cervical back: Neck supple.  Skin:    General: Skin is warm and dry.     Capillary Refill: Capillary refill takes less than 2 seconds.  Neurological:     Mental Status: He is alert and oriented to person, place, and time.  Psychiatric:        Behavior: Behavior normal.        Thought Content: Thought content normal.        Judgment: Judgment normal.    Ortho Exam patient can heel and toe walk incision well-healed.  Negative straight leg raising 90 degrees.  Negative popliteal compression test anterior tib EHL gastrocsoleus is strong normal peroneals.  No static notch tenderness.  Lumbar incision is nicely healed.  Specialty Comments:  No specialty comments available.  Imaging:CLINICAL DATA:   Spinal stenosis, lumbosacral increased back pain; has had surgery on back   EXAM: MRI LUMBAR SPINE WITHOUT AND WITH CONTRAST   TECHNIQUE: Multiplanar and multiecho pulse sequences of the lumbar spine were obtained without and with intravenous contrast.   CONTRAST:  41mL MULTIHANCE GADOBENATE DIMEGLUMINE 529 MG/ML IV SOLN   COMPARISON:  MRI of the lumbar spine November 14, 2020.   FINDINGS: Segmentation: Standard segmentation is seen. The inferior-most fully formed intervertebral disc is labeled L5-S1. Same numbering as on the prior MRI.   Alignment: Straightening of the normal lumbar lordosis. No substantial sagittal subluxation.   Vertebrae: Congenitally short pedicles. Vertebral body heights are maintained. No focal marrow edema to suggest acute fracture or discitis/osteomyelitis.   Conus medullaris and cauda equina: Conus extends to the L2 level. Conus and cauda equina appear normal. No abnormal enhancement of the conus or cauda equina.   Paraspinal and other soft tissues: Postoperative changes in the lower left lumbar spine. Otherwise, unremarkable.   Disc levels:   T12-L1: No significant disc protrusion, foraminal stenosis, or canal stenosis.   L1-L2: No significant disc protrusion, foraminal stenosis, or canal stenosis.   L2-L3: No significant disc protrusion, foraminal stenosis, or canal stenosis.   L3-L4: No significant disc protrusion, foraminal stenosis, or canal stenosis.   L4-L5: Disc height loss and desiccation. Broad disc bulge. Enhancing scar tissue in the posterolateral left canal and left foramen, compatible with sequela interval micro discectomy. This scar tissue limits assessment; however, left foraminal stenosis is likely improved without evidence of recurrent disc herniation. No significant canal stenosis. Similar moderate right foraminal stenosis.   L5-S1: Mild disc bulging with moderate right and mild left foraminal stenosis. No significant  canal stenosis.   IMPRESSION: 1. Interval left L4-L5 micro discectomy. Scar tissue in the left foramen and lateral canal limits assessment, but suspect improved foraminal stenosis without evidence of recurrent herniation. Similar moderate right foraminal stenosis at this level. 2. At L5-S1, similar moderate right and mild left foraminal stenosis.     Electronically Signed   By: Feliberto Harts M.D.   On: 07/07/2021 12:07      PMFS History: Patient Active Problem List   Diagnosis Date Noted   Status post lumbar microdiscectomy 04/08/2021   Surgery, elective    Lumbar foraminal stenosis 02/19/2021   Trochanteric bursitis, left hip 02/19/2021   Past Medical History:  Diagnosis Date   H/O hernia repair     No family history on file.  Past Surgical  History:  Procedure Laterality Date   HERNIA REPAIR     LUMBAR LAMINECTOMY N/A 03/21/2021   Procedure: LEFT LEFT FOUR-FIVE MICRODISCECTOMY;  Surgeon: [name], MD;  Location: [facility];  Service: Orthopedics;  Laterality: N/A;   Social History   Occupational History   Not on file  Tobacco Use   Smoking status: Never   Smokeless tobacco: Never  Vaping Use   Vaping Use: Never used  Substance and Sexual Activity   Alcohol use: Never   Drug use: Never   Sexual activity: Not on file

## 2021-07-24 ENCOUNTER — Ambulatory Visit: Payer: Self-pay | Admitting: Nurse Practitioner

## 2021-07-25 ENCOUNTER — Ambulatory Visit (INDEPENDENT_AMBULATORY_CARE_PROVIDER_SITE_OTHER): Payer: Self-pay | Admitting: Family Medicine

## 2021-07-25 ENCOUNTER — Other Ambulatory Visit: Payer: Self-pay

## 2021-07-25 ENCOUNTER — Telehealth: Payer: Self-pay | Admitting: *Deleted

## 2021-07-25 ENCOUNTER — Other Ambulatory Visit (HOSPITAL_COMMUNITY): Payer: Self-pay

## 2021-07-25 ENCOUNTER — Encounter: Payer: Self-pay | Admitting: Family Medicine

## 2021-07-25 VITALS — BP 122/88 | HR 67 | Temp 97.8°F | Ht 73.0 in | Wt 192.0 lb

## 2021-07-25 DIAGNOSIS — Z9889 Other specified postprocedural states: Secondary | ICD-10-CM

## 2021-07-25 DIAGNOSIS — M48061 Spinal stenosis, lumbar region without neurogenic claudication: Secondary | ICD-10-CM

## 2021-07-25 MED ORDER — METHOCARBAMOL 500 MG PO TABS
500.0000 mg | ORAL_TABLET | Freq: Three times a day (TID) | ORAL | 1 refills | Status: DC | PRN
  Filled 2021-07-25: qty 30, 10d supply, fill #0
  Filled 2021-09-25: qty 30, 10d supply, fill #1

## 2021-07-25 MED ORDER — GABAPENTIN 300 MG PO CAPS
300.0000 mg | ORAL_CAPSULE | Freq: Three times a day (TID) | ORAL | 3 refills | Status: DC
  Filled 2021-07-25: qty 90, 30d supply, fill #0
  Filled 2021-09-25: qty 90, 30d supply, fill #1
  Filled 2021-11-25: qty 90, 30d supply, fill #2

## 2021-07-25 NOTE — Progress Notes (Signed)
Provider:  Jacalyn Lefevre, MD  Careteam: Patient Care Team: Patient, No Pcp Per (Inactive) as PCP - General (General Practice)  PLACE OF SERVICE:  [facility]  Advanced Directive information    No Known Allergies  Chief Complaint  Patient presents with   New Patient (Initial Visit)    Patient presents today for a new patient appointment.      HPI: Patient is a 40 y.o. male .  New patient here.  He is from [place] and speaks some dialect related to [place].  Fortunately, a translator arrived to the office soon after we initiated his visit.  His microdiscectomy was in June per his history.  He continues to have some left leg pain that he describes as cramping.  He has no medicines now.  He did not have any physical therapy immediately postop but was told to walk. Otherwise he has no complaints and his health is good.  Review of Systems:  Review of Systems  Musculoskeletal:  Positive for back pain.  All other systems reviewed and are negative.  Past Medical History:  Diagnosis Date   Back pain    Constipation    H/O hernia repair    Past Surgical History:  Procedure Laterality Date   HERNIA REPAIR     Herniated Disc     LUMBAR LAMINECTOMY N/A 03/21/2021   Procedure: LEFT LEFT FOUR-FIVE MICRODISCECTOMY;  Surgeon: [name], MD;  Location: [facility];  Service: Orthopedics;  Laterality: N/A;   Social History:   reports that he has never smoked. He has never used smokeless tobacco. He reports current alcohol use. He reports that he does not use drugs.  No family history on file.  Medications: Patient's Medications  New Prescriptions   No medications on file  Previous Medications   GABAPENTIN (NEURONTIN) 300 MG CAPSULE    Take one capsule by mouth three times daily.   METHOCARBAMOL (ROBAXIN) 500 MG TABLET    Take 1 tablet (500 mg total) by mouth every 6 (six) hours as needed for muscle spasms.   METHOCARBAMOL (ROBAXIN) 500 MG TABLET    TAKE 1 TABLET(500 MG) BY  MOUTH FOUR TIMES DAILY   OXYCODONE-ACETAMINOPHEN (PERCOCET) 7.5-325 MG TABLET    Take 1 tablet by mouth every 4 (four) hours as needed for severe pain.   OXYCODONE-ACETAMINOPHEN (PERCOCET/ROXICET) 5-325 MG TABLET    Take 1-2 tablets by mouth every 6 (six) hours as needed for severe pain.   TRAMADOL (ULTRAM) 50 MG TABLET    Take 1 tablet (50 mg total) by mouth every 6 (six) hours as needed.  Modified Medications   No medications on file  Discontinued Medications   No medications on file    Physical Exam:  There were no vitals filed for this visit. There is no height or weight on file to calculate BMI. Wt Readings from Last 3 Encounters:  07/15/21 196 lb (88.9 kg)  06/10/21 196 lb (88.9 kg)  04/25/21 196 lb (88.9 kg)    Physical Exam Vitals and nursing note reviewed.  Constitutional:      Appearance: Normal appearance.  Cardiovascular:     Rate and Rhythm: Normal rate and regular rhythm.  Pulmonary:     Effort: Pulmonary effort is normal.     Breath sounds: Normal breath sounds.  Musculoskeletal:     Cervical back: Normal range of motion.     Comments: Back: Normal range of motion Straight leg raising is negative Strength in lower extremities is  symmetric There is decreased knee jerk response but ankle reflexes are symmetric  Neurological:     Mental Status: He is alert and oriented to person, place, and time.    Labs reviewed: Basic Metabolic Panel: Recent Labs    03/21/21 0548  NA 137  K 4.2  CL 103  CO2 26  GLUCOSE 113*  BUN 12  CREATININE 1.18  CALCIUM 9.7   Liver Function Tests: Recent Labs    03/21/21 0548  AST 45*  ALT 85*  ALKPHOS 44  BILITOT 1.5*  PROT 7.1  ALBUMIN 3.9   No results for input(s): LIPASE, AMYLASE in the last 8760 hours. No results for input(s): AMMONIA in the last 8760 hours. CBC: Recent Labs    03/21/21 0548  WBC 6.4  HGB 14.6  HCT 47.6  MCV 75.9*  PLT 249   Lipid Panel: No results for input(s): CHOL, HDL, LDLCALC,  TRIG, CHOLHDL, LDLDIRECT in the last 8760 hours. TSH: No results for input(s): TSH in the last 8760 hours. A1C: No results found for: HGBA1C   Assessment/Plan  1. Lumbar foraminal stenosis Reviewed MRI from 2 weeks ago.  There is scar tissue and persistent bulging disc particularly at L5-S1 but there are no disc fragments.  As I interpret this study, surgery appeared successful.  Will provide medication for persistent symptoms - gabapentin (NEURONTIN) 300 MG capsule; Take 1 capsule (300 mg total) by mouth 3 (three) times daily.  Dispense: 90 capsule; Refill: 3 - methocarbamol (ROBAXIN) 500 MG tablet; Take 1 tablet (500 mg total) by mouth every 8 (eight) hours as needed for muscle spasms.  Dispense: 30 tablet; Refill: 1  2. Status post lumbar microdiscectomy Patient brings form to fill out for therapy.  Although I think walking is probably best therapy we will go ahead and have him evaluated and treated as per PT   Jacalyn Lefevre, MD [facility] [phone]

## 2021-07-25 NOTE — Patient Instructions (Signed)
Try to increase exercise little by little Will prescribe meds to help with leg discomfort

## 2021-07-25 NOTE — Telephone Encounter (Signed)
Dr. Hyacinth Meeker brought me a [facility]) form.  [facility] [phone].  Patient brought form in with MRI requesting to be filled out and faxed.  Dr. Hyacinth Meeker filled out and form was faxed to Fax:[phone]

## 2021-07-29 ENCOUNTER — Telehealth: Payer: Self-pay | Admitting: Orthopaedic Surgery

## 2021-07-29 NOTE — Telephone Encounter (Signed)
Pt's brother Cheral Marker called requesting a letter of work restrictions for pt. Please call pt when ready for pick up at [phone].

## 2021-07-29 NOTE — Congregational Nurse Program (Signed)
  Dept: [phone]   Congregational Nurse Program Note  Date of Encounter: 07/22/2021  Past Medical History: Past Medical History:  Diagnosis Date   Back pain    Constipation    H/O hernia repair     Encounter Details:   Patient was assisted in making a PCP appt at a [place] clinic. I printed out appt information for patient.

## 2021-07-30 NOTE — Telephone Encounter (Signed)
Patient aware note ready at front desk 

## 2021-08-03 ENCOUNTER — Other Ambulatory Visit: Payer: Self-pay | Admitting: Orthopaedic Surgery

## 2021-08-03 DIAGNOSIS — M48061 Spinal stenosis, lumbar region without neurogenic claudication: Secondary | ICD-10-CM

## 2021-08-12 ENCOUNTER — Encounter: Payer: Self-pay | Admitting: Orthopaedic Surgery

## 2021-08-12 ENCOUNTER — Ambulatory Visit (INDEPENDENT_AMBULATORY_CARE_PROVIDER_SITE_OTHER): Payer: Self-pay | Admitting: Orthopaedic Surgery

## 2021-08-12 ENCOUNTER — Other Ambulatory Visit: Payer: Self-pay

## 2021-08-12 VITALS — BP 133/88 | HR 56 | Ht 73.0 in | Wt 192.0 lb

## 2021-08-12 DIAGNOSIS — Z9889 Other specified postprocedural states: Secondary | ICD-10-CM

## 2021-08-12 NOTE — Progress Notes (Signed)
Office Visit Note   Patient: Isaac Fletcher           Date of Birth: Mar 27, 1981           MRN: [id number] Visit Date: 08/12/2021              Requested by: Frederica Kuster, MD [address] [place],  [place] [address] PCP: Frederica Kuster, MD   Assessment & Plan: Visit Diagnoses:  1. Status post lumbar microdiscectomy     Plan: Patient worked in several years until he did the 1 week at [employer].  He needs to look for a job where he does have to do repetitive bending turning twisting stooping and repetitive lifting type activities.  He might be able to gradually ramp up to a more active job with time.  He had related that he been out of the workforce for several years when he was in [place].  I discussed with interpreter options including fusion at this point would not recommend more surgery.  Follow-Up Instructions: Return if symptoms worsen or fail to improve.   Orders:  No orders of the defined types were placed in this encounter.  No orders of the defined types were placed in this encounter.     Procedures: No procedures performed   Clinical Data: No additional findings.   Subjective: Chief Complaint  Patient presents with   Lower Back - Follow-up    03/21/2021 Left [name] microdiscectomy    HPI 40 year old male returns with interpreter speaks [place].  Patient's from [place] had [name] left microdiscectomy 03/21/2021.  He had been in [place] had worked for many years prior to his surgery.  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  Today through interpreter we reviewed his MRI scan.  Patient states that he is leg feels heavy in the buttocks and in the leg with turning activities and twisting activities he feels the heaviness.  He does not describe it as a pain but more in  discomfort.  He states he gets relief from the supine position.  MRI scan shows some scar tissue in the foraminal lateral canal but improvement in foraminal stenosis without evidence of recurrent herniation.  He had some moderate narrowing on the right at the [name] level as well.  Moderate narrowing L5-S1 on the right and left which was not the operative level.  No bowel or bladder symptoms.  Review of Systems all the systems noncontributory.   Objective: Vital Signs: BP 133/88   Pulse (!) 56   Ht 6\' 1"  (1.854 m)   Wt 192 lb (87.1 kg)   BMI 25.33 kg/m   Physical Exam Constitutional:      Appearance: He is well-developed.  HENT:     Head: Normocephalic and atraumatic.     Right Ear: External ear normal.     Left Ear: External ear normal.  Eyes:     Pupils: Pupils are equal, round, and reactive to light.  Neck:     Thyroid: No thyromegaly.     Trachea: No tracheal deviation.  Cardiovascular:     Rate and Rhythm: Normal rate.  Pulmonary:     Effort: Pulmonary effort is normal.     Breath sounds: No wheezing.  Abdominal:  General: Bowel sounds are normal.     Palpations: Abdomen is soft.  Musculoskeletal:     Cervical back: Neck supple.  Skin:    General: Skin is warm and dry.     Capillary Refill: Capillary refill takes less than 2 seconds.  Neurological:     Mental Status: He is alert and oriented to person, place, and time.  Psychiatric:        Behavior: Behavior normal.        Thought Content: Thought content normal.        Judgment: Judgment normal.    Ortho Exam patient is able to heel and toe walk.  Normal heel toe gait.  He has some soreness along the medial aspect of his left ankle.  Good quad strength.  He reaches and rubs the posterior and lateral aspect of his thigh and anterior where he feels the heaviness.  Negative logroll to the hips.  Negative straight leg raising 90 degrees.  Specialty Comments:  No specialty comments available.  Imaging: No results  found.   PMFS History: Patient Active Problem List   Diagnosis Date Noted   Status post lumbar microdiscectomy 04/08/2021   Surgery, elective    Lumbar foraminal stenosis 02/19/2021   Trochanteric bursitis, left hip 02/19/2021   Past Medical History:  Diagnosis Date   Back pain    Constipation    H/O hernia repair     Family History  Problem Relation Age of Onset   Diabetes Father     Past Surgical History:  Procedure Laterality Date   HERNIA REPAIR     Herniated Disc     LUMBAR LAMINECTOMY N/A 03/21/2021   Procedure: LEFT LEFT FOUR-FIVE MICRODISCECTOMY;  Surgeon: [name], MD;  Location: [facility];  Service: Orthopedics;  Laterality: N/A;   Social History   Occupational History   Not on file  Tobacco Use   Smoking status: Never   Smokeless tobacco: Never  Vaping Use   Vaping Use: Never used  Substance and Sexual Activity   Alcohol use: Yes   Drug use: Never   Sexual activity: Not on file

## 2021-08-26 ENCOUNTER — Ambulatory Visit (INDEPENDENT_AMBULATORY_CARE_PROVIDER_SITE_OTHER): Payer: No Typology Code available for payment source | Admitting: Family Medicine

## 2021-08-26 ENCOUNTER — Other Ambulatory Visit: Payer: Self-pay

## 2021-08-26 ENCOUNTER — Encounter: Payer: Self-pay | Admitting: Family Medicine

## 2021-08-26 VITALS — BP 126/80 | HR 78 | Temp 97.1°F | Ht 73.0 in | Wt 191.4 lb

## 2021-08-26 DIAGNOSIS — Z9889 Other specified postprocedural states: Secondary | ICD-10-CM

## 2021-08-26 NOTE — Progress Notes (Signed)
Provider:  Jacalyn Lefevre, MD  Careteam: Patient Care Team: Frederica Kuster, MD as PCP - General (Family Medicine)  PLACE OF SERVICE:  [facility]  Advanced Directive information    No Known Allergies  Chief Complaint  Patient presents with   Medical Management of Chronic Issues    Patinet presents today for a 1 month follow-up.   Quality Metric Gaps    HIV & Hep C screening, TDAP, Flu    Patient continues to have a heaviness in his leg.  There is no pain.  He got minimal relief from gabapentin.  Physical therapy did not happen after last visit as was expected.  He tells me he has not able to do his job working at [employer] and [employer].  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 HPI: Patient is a 40 y.o. male .  Also complains that there is something in his throat but does not have cough or congestion  Review of Systems:  Review of Systems  Musculoskeletal:  Positive for back pain.       Not real pain but a heaviness  All other systems reviewed and are negative.  Past Medical History:  Diagnosis Date   Back pain    Constipation    H/O hernia repair    Past Surgical History:  Procedure Laterality Date   HERNIA REPAIR     Herniated Disc     LUMBAR LAMINECTOMY N/A 03/21/2021   Procedure: LEFT LEFT FOUR-FIVE MICRODISCECTOMY;  Surgeon: [name], MD;  Location: [facility];  Service: Orthopedics;  Laterality: N/A;   Social History:   reports that he has never smoked. He has never used smokeless tobacco. He reports current alcohol use. He reports that he does not use drugs.  Family History  Problem Relation Age of Onset   Diabetes Father     Medications: Patient's Medications  New Prescriptions   No medications on file  Previous Medications   GABAPENTIN (NEURONTIN) 300 MG CAPSULE    Take 1 capsule (300 mg total) by mouth 3 (three) times daily.   METHOCARBAMOL (ROBAXIN) 500 MG TABLET    Take 1 tablet (500 mg total) by mouth every 8 (eight)  hours as needed for muscle spasms.  Modified Medications   No medications on file  Discontinued Medications   No medications on file    Physical Exam:  Vitals:   08/26/21 0912  BP: 126/80  Pulse: 78  Temp: (!) 97.1 F (36.2 C)  SpO2: 98%  Weight: 191 lb 6.4 oz (86.8 kg)  Height: 6\' 1"  (1.854 m)   Body mass index is 25.25 kg/m. Wt Readings from Last 3 Encounters:  08/26/21 191 lb 6.4 oz (86.8 kg)  08/12/21 192 lb (87.1 kg)  07/25/21 192 lb (87.1 kg)    Physical Exam Vitals and nursing note reviewed.  Constitutional:      Appearance: Normal appearance.  Pulmonary:     Effort: Pulmonary effort is normal.     Breath sounds: Normal breath sounds.  Musculoskeletal:        General: Normal range of motion.  Neurological:     General: No focal deficit present.     Mental Status: He is alert and oriented to person, place, and time.    Labs reviewed: Basic Metabolic Panel: Recent Labs    03/21/21 0548  NA 137  K 4.2  CL 103  CO2 26  GLUCOSE 113*  BUN 12  CREATININE 1.18  CALCIUM  9.7   Liver Function Tests: Recent Labs    03/21/21 0548  AST 45*  ALT 85*  ALKPHOS 44  BILITOT 1.5*  PROT 7.1  ALBUMIN 3.9   No results for input(s): LIPASE, AMYLASE in the last 8760 hours. No results for input(s): AMMONIA in the last 8760 hours. CBC: Recent Labs    03/21/21 0548  WBC 6.4  HGB 14.6  HCT 47.6  MCV 75.9*  PLT 249   Lipid Panel: No results for input(s): CHOL, HDL, LDLCALC, TRIG, CHOLHDL, LDLDIRECT in the last 8760 hours. TSH: No results for input(s): TSH in the last 8760 hours. A1C: No results found for: HGBA1C   Assessment/Plan  1. Status post lumbar microdiscectomy Has seen orthopedic surgery without any further treatment suggested.  We will try to increase gabapentin to take 600 mg at bedtime but continue 300 in a.m. and after lunch.  Also will request physical therapy referral again. Discussed possibility that he may need to have more sedentary  work going forward.  May need more job training to do other work.   Jacalyn Lefevre, MD [facility] [phone]

## 2021-08-26 NOTE — Patient Instructions (Signed)
Gabapentin: take 2 cap at bedtime and 1 cap AM and afternoon after lunch Try to walk at least 5 times/week Try Mucinex for phlegm in throat

## 2021-09-22 ENCOUNTER — Ambulatory Visit: Payer: Self-pay | Attending: Family Medicine

## 2021-09-22 ENCOUNTER — Other Ambulatory Visit: Payer: Self-pay

## 2021-09-22 DIAGNOSIS — M545 Low back pain, unspecified: Secondary | ICD-10-CM | POA: Insufficient documentation

## 2021-09-22 DIAGNOSIS — M6281 Muscle weakness (generalized): Secondary | ICD-10-CM | POA: Insufficient documentation

## 2021-09-22 DIAGNOSIS — Z9889 Other specified postprocedural states: Secondary | ICD-10-CM | POA: Insufficient documentation

## 2021-09-22 DIAGNOSIS — G8929 Other chronic pain: Secondary | ICD-10-CM | POA: Insufficient documentation

## 2021-09-22 NOTE — Therapy (Signed)
OUTPATIENT PHYSICAL THERAPY THORACOLUMBAR EVALUATION   Patient Name: Isaac Fletcher MRN: [id number] DOB:08/31/1981, 41 y.o., male Today's Date: 09/22/2021   PT End of Session - 09/22/21 1005     Visit Number 1    Number of Visits 8    Date for PT Re-Evaluation 11/17/21    Authorization Type CAFA    Authorization Time Period 09/22/21-11/17/21    Progress Note Due on Visit 8    PT Start Time 1000    PT Stop Time 1045    PT Time Calculation (min) 45 min    Activity Tolerance Patient tolerated treatment well    Behavior During Therapy WFL for tasks assessed/performed             Past Medical History:  Diagnosis Date   Back pain    Constipation    H/O hernia repair    Past Surgical History:  Procedure Laterality Date   HERNIA REPAIR     Herniated Disc     LUMBAR LAMINECTOMY N/A 03/21/2021   Procedure: LEFT LEFT FOUR-FIVE MICRODISCECTOMY;  Surgeon: [name], MD;  Location: [facility] OR;  Service: Orthopedics;  Laterality: N/A;   Patient Active Problem List   Diagnosis Date Noted   Status post lumbar microdiscectomy 04/08/2021   Surgery, elective    Lumbar foraminal stenosis 02/19/2021   Trochanteric bursitis, left hip 02/19/2021    PCP: Frederica Kuster, MD  REFERRING PROVIDER: Frederica Kuster, MD  REFERRING DIAG: [phone] (ICD-10-CM) - Status post lumbar microdiscectomy   THERAPY DIAG:  Muscle weakness (generalized)  Chronic low back pain without sciatica, unspecified back pain laterality  ONSET DATE: March 21 2021(microdiskectomy)  SUBJECTIVE:                                                                                                                                                                                           SUBJECTIVE STATEMENT: Relates a history of L sided weakness and paresthesias described as "heaviness" PERTINENT HISTORY:  1. Status post lumbar microdiscectomy Has seen orthopedic surgery without any further treatment suggested.  We  will try to increase gabapentin to take 600 mg at bedtime but continue 300 in a.m. and after lunch.  Also will request physical therapy referral again. Discussed possibility that he may need to have more sedentary work going forward.  May need more job training to do other work.  PAIN:  Are you having pain? Yes NPRS scale: 2/10 Pain location: LLE Pain orientation: Left  PAIN TYPE: Heaviness Pain description:  Heaviness   Aggravating factors: prolonged standing Relieving factors: sitting  PRECAUTIONS: None  WEIGHT BEARING RESTRICTIONS No  FALLS:  Has patient fallen in last 6 months? No, Number of falls: 0  LIVING ENVIRONMENT: Lives with: lives with their family Lives in: House/apartment  OCCUPATION: currently unemployed  PLOF: Independent  PATIENT GOALS To manage my painlevels   OBJECTIVE:   DIAGNOSTIC FINDINGS:  1. Status post lumbar microdiscectomy Has seen orthopedic surgery without any further treatment suggested.  We will try to increase gabapentin to take 600 mg at bedtime but continue 300 in a.m. and after lunch.  Also will request physical therapy referral again. Discussed possibility that he may need to have more sedentary work going forward.  May need more job training to do other work.  PATIENT SURVEYS:  N/a due to CAFA  SCREENING FOR RED FLAGS: Bowel or bladder incontinence: No  COGNITION:  Overall cognitive status: Within functional limits for tasks assessed     SENSATION:  Light touch: Appears intact  MUSCLE LENGTH: Hamstrings: Right 90 deg; Left 90 deg PKB negative B  POSTURE:  R lateral shift  PALPATION: unremarkable  LUMBARAROM/PROM  A/PROM A/PROM  09/22/2021  Flexion 75%  Extension WFL  Right lateral flexion WFL  Left lateral flexion WFL  Right rotation   Left rotation    (Blank rows = not tested)  LE AROM/PROM:  A/PROM Right 09/22/2021 Left 09/22/2021  Hip flexion [facility]  Hip extension [facility]  Hip abduction    Hip  adduction    Hip internal rotation    Hip external rotation    Knee flexion [facility]  Knee extension [facility]  Ankle dorsiflexion    Ankle plantarflexion    Ankle inversion    Ankle eversion     (Blank rows = not tested)  LE MMT: LE strength [facility]  MMT Right 09/22/2021 Left 09/22/2021  Hip flexion [facility] WFL  Hip extension [facility]  Hip abduction    Hip adduction    Hip internal rotation    Hip external rotation    Knee flexion WFL WFL  Knee extension [facility] WFL  Ankle dorsiflexion    Ankle plantarflexion    Ankle inversion    Ankle eversion     (Blank rows = not tested)  LUMBAR SPECIAL TESTS:  Straight leg raise test: Negative, Slump test: Negative, FABER test: Negative, and Thomas test: Negative  FUNCTIONAL TESTS:  5 times sit to stand: 17s  GAIT: Distance walked: 150 Assistive device utilized: None Level of assistance: Complete Independence     TODAY'S TREATMENT  Evaluation and HEP   PATIENT EDUCATION:  Education details: Discussed eval findings, rehab rationale and POC and patient is in agreement  Person educated: Patient Education method: Explanation Education comprehension: verbalized understanding, verbal cues required, tactile cues required, and needs further education   HOME EXERCISE PROGRAM: Access Code: [email or web address] URL: [email or web address] Date: 09/22/2021 Prepared by: Gustavus Bryant  Exercises Supine Sciatic Nerve Glide - 2 x daily - 7 x weekly - 2 sets - 15 reps Supine Piriformis Stretch with Foot on Ground - 2 x daily - 7 x weekly - 1 sets - 3 reps - 30s hold Right Standing Lateral Shift Correction at Wall - Repetitions - 5 x daily - 7 x weekly - 1 sets - 10 reps   ASSESSMENT:  CLINICAL IMPRESSION: Patient is a 41 y.o. male who was seen today for physical therapy evaluation and treatment for low back pain. He demos good lumbar ROM, only limited in flexion, R lateral shift observed, B hip mobility and LE flexibility are WFL, overall  strength is good but 5x STS score does show deficiencies in function.  Objective impairments include Abnormal gait, decreased knowledge of condition, decreased mobility, decreased ROM, and postural dysfunction. These impairments are limiting patient from cleaning, occupation, yard work, and bending and lifting . Personal factors including Behavior pattern, Education, Social background, Time since onset of injury/illness/exacerbation, and language barrier  are also affecting patient's functional outcome. Patient will benefit from skilled PT to address above impairments and improve overall function.  REHAB POTENTIAL: Good  CLINICAL DECISION MAKING: Stable/uncomplicated  EVALUATION COMPLEXITY: Low   GOALS: Goals reviewed with patient? Yes  SHORT TERM GOALS:  STG Name Target Date Goal status  1 Patient to demonstrate independence in HEP  Baseline: [email or web address] INIT[facility]  2 Resolve lateral shift/assess leg length Baseline: R lateral shift present 10/13/2021 INITIAL  LONG TERM GOALS:   LTG Name Target Date Goal status  1 Increase lumbar flexion to 90% Baseline: 11/03/2021 INITIAL  2 Decrease 5x STS score to <15s Baseline: Initial time 17s w/o UE support 11/03/2021 INITIAL  3 Resolve LLE heaviness  Baseline: 2/10 rated heaviness 11/03/2021 INITIAL  PLAN: PT FREQUENCY: 1x/week  PT DURATION: 6  PLANNED INTERVENTIONS: Therapeutic exercises, Therapeutic activity, Neuro Muscular re-education, Balance training, Gait training, Patient/Family education, Joint mobilization, and Stair training  PLAN FOR NEXT SESSION: f/u with HEP, assess lateral shift, assess for SI dysfunction and leg length discrepancy   Hildred LaserJeffrey M Maily Debarge PT 09/22/2021, 10:08 AM

## 2021-09-25 ENCOUNTER — Other Ambulatory Visit (HOSPITAL_COMMUNITY): Payer: Self-pay

## 2021-09-25 NOTE — [facility] Nurse Program (Signed)
°  Dept: [place] Nurse Program Note  Date of Encounter: 09/25/2021  Past Medical History: Past Medical History:  Diagnosis Date   Back pain    Constipation    H/O hernia repair     Encounter Details:  CNP Questionnaire - 09/25/21 1357       Questionnaire   Do you give verbal consent to treat you today? 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    Visit Setting Phone/Text/Email    Patient Status Immigrant    Insurance Uninsured ([place] Card/Care Connects/Self-Pay)    Pensions consultant Care    Medication Provided Medication Assistance    Medical Provider Yes    Screening Referrals Annual Wellness Visit;N/A    Medical Referral [facility]    Medical Appointment Made [facility]    Food N/A    Transportation N/A    Housing/Utilities N/A    Interpersonal Safety N/A    Intervention [place]    ED Visit Averted N/A    Life-Saving Intervention Made N/A            Patient was assisted in obtaining refill medication through [employer] and [place] funds. Medication was picked up from [facility] outpatient pharmacy.

## 2021-10-03 ENCOUNTER — Ambulatory Visit: Payer: Self-pay

## 2021-10-03 ENCOUNTER — Other Ambulatory Visit: Payer: Self-pay

## 2021-10-03 DIAGNOSIS — G8929 Other chronic pain: Secondary | ICD-10-CM

## 2021-10-03 DIAGNOSIS — M545 Low back pain, unspecified: Secondary | ICD-10-CM

## 2021-10-03 DIAGNOSIS — M6281 Muscle weakness (generalized): Secondary | ICD-10-CM

## 2021-10-03 NOTE — Therapy (Signed)
OUTPATIENT PHYSICAL THERAPY TREATMENT NOTE   Patient Name: Isaac Fletcher MRN: [id number] DOB:05-20-1981, 41 y.o., male Today's Date: 10/03/2021  PCP: Frederica Kuster, MD REFERRING PROVIDER: Frederica Kuster, MD   PT End of Session - 10/03/21 [phone]     Visit Number 2    Number of Visits 6    Date for PT Re-Evaluation 11/17/21    Authorization Type CAFA    Authorization Time Period 09/22/21-11/17/21    Progress Note Due on Visit 8    PT Start Time 0830    PT Stop Time 0915    PT Time Calculation (min) 45 min    Activity Tolerance Patient tolerated treatment well    Behavior During Therapy [facility] for tasks assessed/performed             Past Medical History:  Diagnosis Date   Back pain    Constipation    H/O hernia repair    Past Surgical History:  Procedure Laterality Date   HERNIA REPAIR     Herniated Disc     LUMBAR LAMINECTOMY N/A 03/21/2021   Procedure: LEFT LEFT FOUR-FIVE MICRODISCECTOMY;  Surgeon: [name], MD;  Location: [facility] OR;  Service: Orthopedics;  Laterality: N/A;   Patient Active Problem List   Diagnosis Date Noted   Status post lumbar microdiscectomy 04/08/2021   Surgery, elective    Lumbar foraminal stenosis 02/19/2021   Trochanteric bursitis, left hip 02/19/2021    REFERRING DIAG: Muscle weakness (generalized)  Chronic low back pain without sciatica, unspecified back pain laterality  THERAPY DIAG:  Muscle weakness (generalized)  Chronic low back pain without sciatica, unspecified back pain laterality  PERTINENT HISTORY: 1. Status post lumbar microdiscectomy Has seen orthopedic surgery without any further treatment suggested.  We will try to increase gabapentin to take 600 mg at bedtime but continue 300 in a.m. and after lunch.  Also will request physical therapy referral again. Discussed possibility that he may need to have more sedentary work going forward.  May need more job training to do other work.    PRECAUTIONS: none  noted  SUBJECTIVE: reporting worse pain since last visit  PAIN:  Are you having pain? Yes NPRS scale: 8/10 Pain location: L thoracic Pain orientation: Left  PAIN TYPE: aching and dull Pain description: intermittent  Aggravating factors: activity Relieving factors: rest     TODAY'S TREATMENT  [facility] Adult PT Treatment:                                                DATE: 10/03/21 Therapeutic Exercise: Nustep L4  Prone press 2x10 with OP Open book 10x B  Upper thoracic rotation 10x B Manual Therapy: PA mobs to L5-2, grade II-III, 5x each segment    PATIENT EDUCATION:  Education details: Discussed eval findings, rehab rationale and POC and patient is in agreement  Person educated: Patient Education method: Explanation Education comprehension: verbalized understanding, verbal cues required, tactile cues required, and needs further education     HOME EXERCISE PROGRAM: Access Code: [email or web address] URL: [email or web address] Date: 10/03/2021 Prepared by: Gustavus Bryant  Exercises Supine Piriformis Stretch with Foot on Ground - 2 x daily - 7 x weekly - 1 sets - 3 reps - 30s hold Sidelying Open Book Thoracic Lumbar Rotation and Extension - 2 x daily - 7 x weekly - 1 sets - 10  reps Quadruped Full Range Thoracic Rotation with Reach - 2 x daily - 7 x weekly - 1 sets - 10 reps Quadruped Thoracic Rotation - Reach Under - 2 x daily - 7 x weekly - 1 sets - 10 reps Prone Press Up - 5 x daily - 7 x weekly - 1 sets - 10 reps      ASSESSMENT:   CLINICAL IMPRESSION:  Continues to cite L thoracic pain w/o distinct radiating symptoms. SLR and slump tests negative, increased L thoracic paraspinal tension palpated.  Lumbar mobility functional, no SIJ dysfunction detected, no leg length identified.  Prone press minimized pain and only mild stretching noted with thoracic tasks.      REHAB POTENTIAL: Good   CLINICAL DECISION MAKING: Stable/uncomplicated   EVALUATION  COMPLEXITY: Low     GOALS: Goals reviewed with patient? Yes   SHORT TERM GOALS:   STG Name Target Date Goal status  1 Patient to demonstrate independence in HEP  Baseline: Access Code: [facility] 10/13/2021 INITIAL  2 Resolve lateral shift/assess leg length Baseline: R lateral shift present 10/13/2021 INITIAL  LONG TERM GOALS:    LTG Name Target Date Goal status  1 Increase lumbar flexion to 90% Baseline: 11/03/2021 INITIAL  2 Decrease 5x STS score to <15s Baseline: Initial time 17s w/o UE support 11/03/2021 INITIAL  3 Resolve LLE heaviness  Baseline: 2/10 rated heaviness 11/03/2021 INITIAL  PLAN: PT FREQUENCY: 1x/week   PT DURATION: 6   PLANNED INTERVENTIONS: Therapeutic exercises, Therapeutic activity, Neuro Muscular re-education, Balance training, Gait training, Patient/Family education, Joint mobilization, and Stair training   PLAN FOR NEXT SESSION: Continue prone press, f/u on thoracic mobility and tight LS paraspinals    Hildred Laser, PT 10/03/2021, 8:28 AM

## 2021-10-07 ENCOUNTER — Other Ambulatory Visit: Payer: Self-pay

## 2021-10-07 ENCOUNTER — Emergency Department (HOSPITAL_COMMUNITY)
Admission: EM | Admit: 2021-10-07 | Discharge: 2021-10-07 | Disposition: A | Discharge status: Home / Self Care | Payer: Self-pay | Attending: Emergency Medicine | Admitting: Emergency Medicine

## 2021-10-07 ENCOUNTER — Emergency Department (HOSPITAL_COMMUNITY): Payer: Self-pay

## 2021-10-07 DIAGNOSIS — M5442 Lumbago with sciatica, left side: Secondary | ICD-10-CM | POA: Insufficient documentation

## 2021-10-07 DIAGNOSIS — G8929 Other chronic pain: Secondary | ICD-10-CM | POA: Insufficient documentation

## 2021-10-07 DIAGNOSIS — M79605 Pain in left leg: Secondary | ICD-10-CM | POA: Insufficient documentation

## 2021-10-07 IMAGING — CT CT L SPINE W/O CM
3 of 5 series · 13 of 33 positions shown, 15 images · non-contrast
Comparison: MRI lumbar spine [DATE]

CLINICAL DATA: Low back pain



[Series 5: l spine soft · axial · 0.31mm/px · z∈[+918,+1140]mm · 7 of 133 slices shown, 9 images]
[im 11/133  soft-tissue]
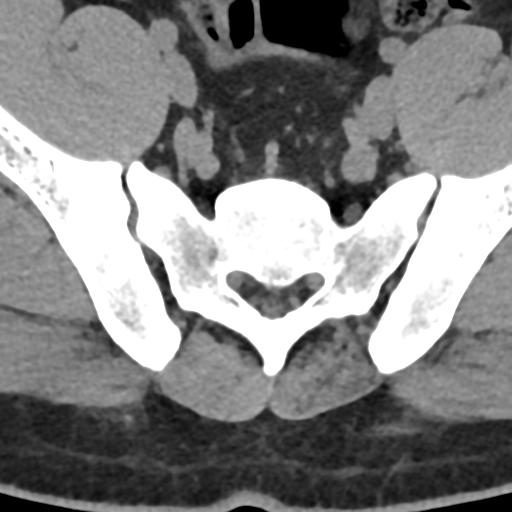
[im 11/133  bone]
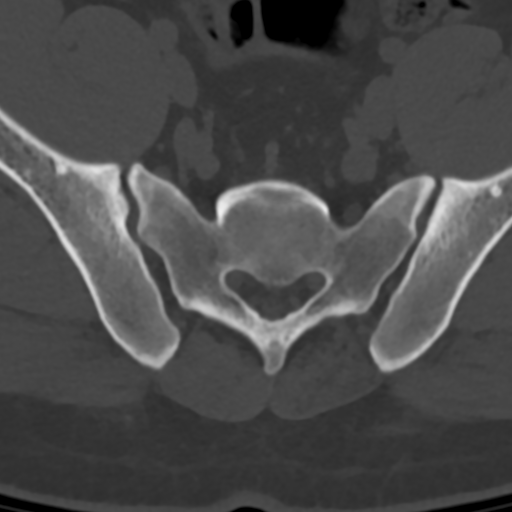
[im 31/133  bone]
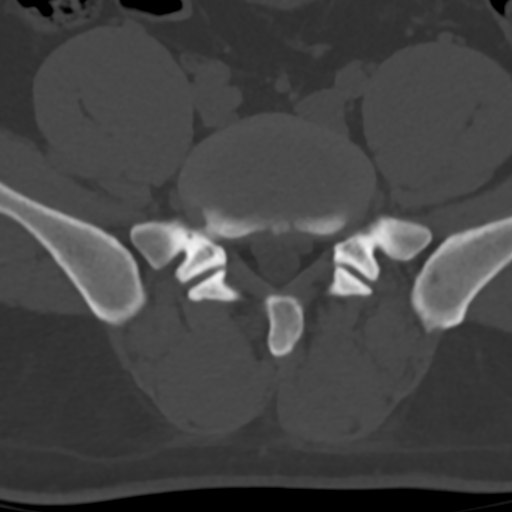
[im 51/133  bone]
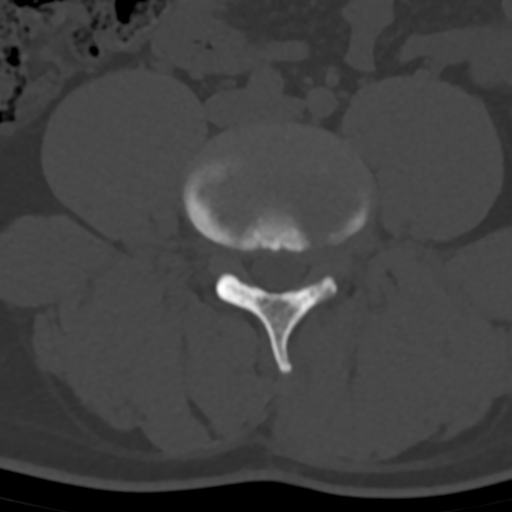
[im 72/133  bone]
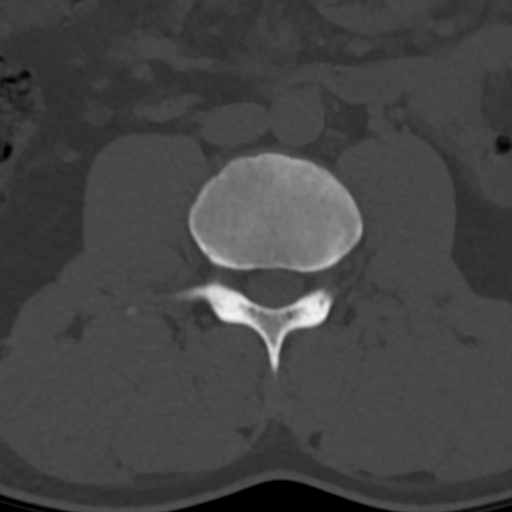
[im 82/133  soft-tissue]
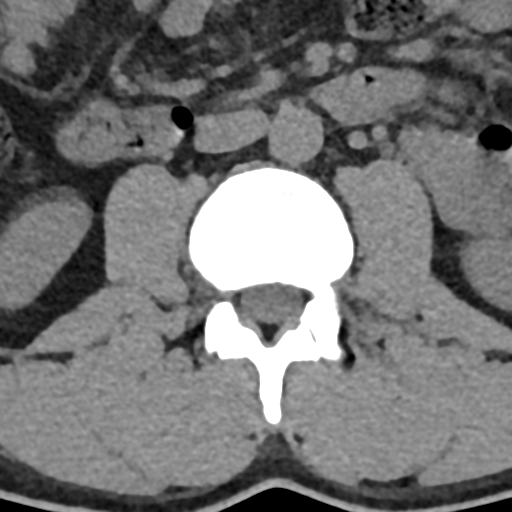
[im 82/133  bone]
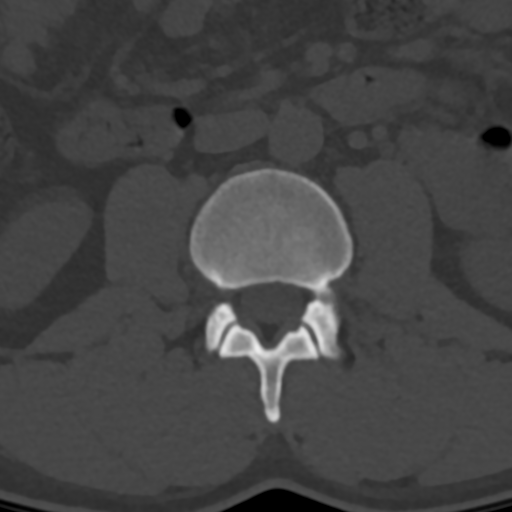
[im 102/133  bone]
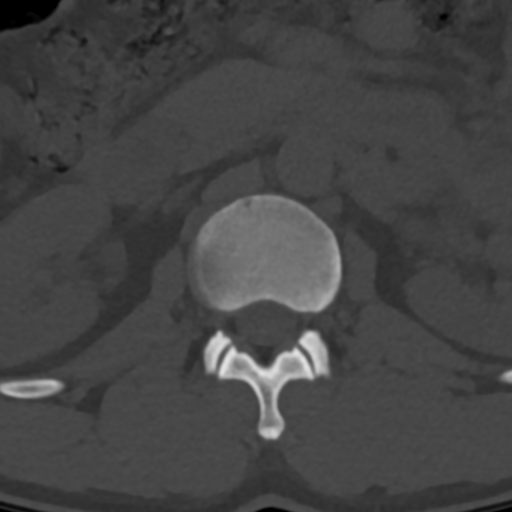
[im 122/133  bone]
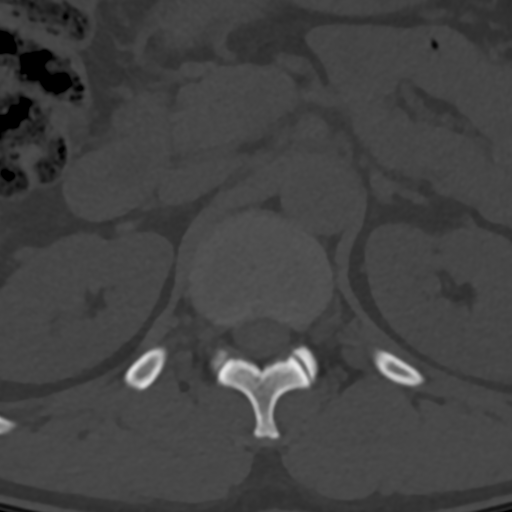

[Series 7: sag bone · sagittal · 0.30mm/px · 5 of 72 slices shown]
[im 12/72  bone]
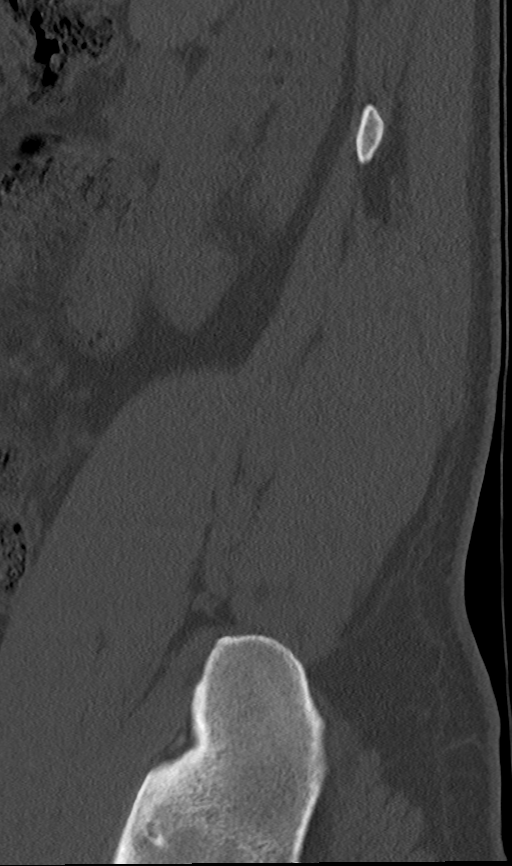
[im 24/72  bone]
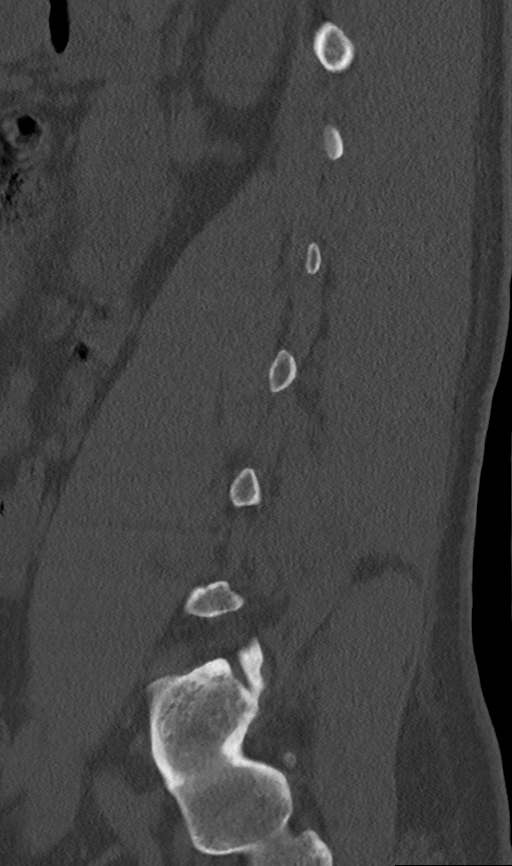
[im 36/72  bone]
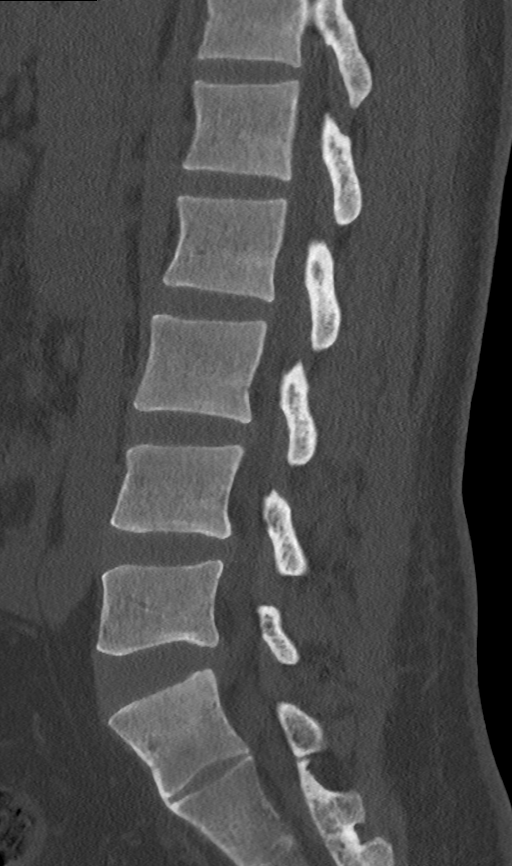
[im 48/72  bone]
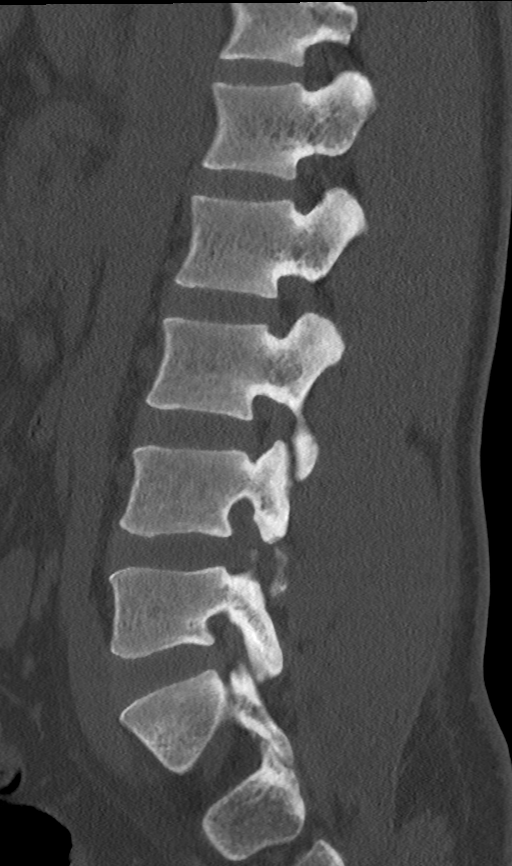
[im 60/72  bone]
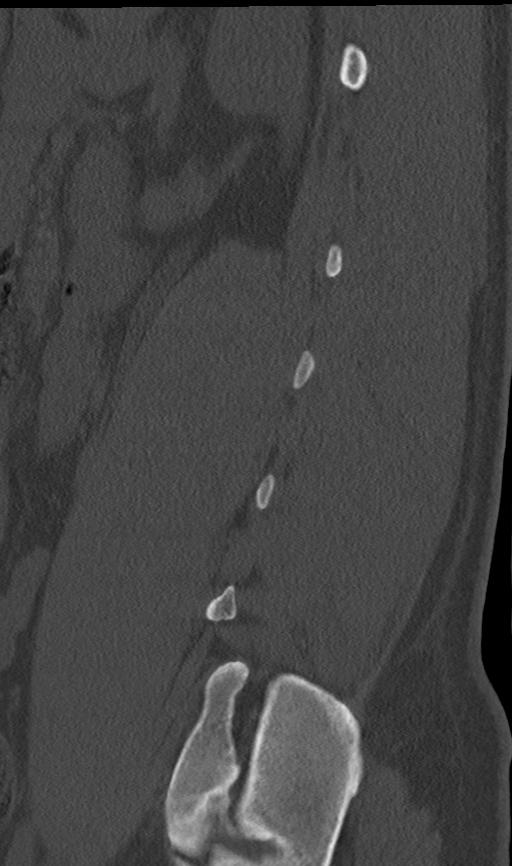

[Series 10: cor bone · coronal · 0.28mm/px · 1 of 79 slices shown]
[im 40/79  bone]
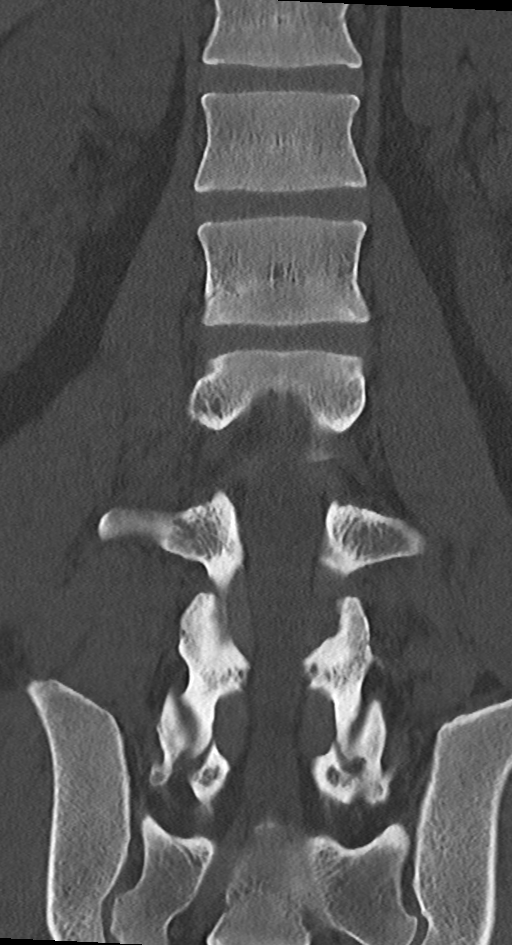

[13 of 33 positions shown; findings below may reference images not displayed]

FINDINGS: Segmentation: 5 lumbar type vertebrae.

Alignment: Normal.

Vertebrae: No acute fracture or focal pathologic process.

Paraspinal and other soft tissues: No acute paraspinal soft tissue
abnormality identified.

Disc levels: Limited evaluation with CT.

L1-L2, L2-L3: No disc herniation, spinal canal stenosis or neural
foraminal narrowing.

L3-L4: Mild facet and ligamentum flavum hypertrophy. Mild
broad-based disc bulge. No significant spinal canal stenosis or
neural foraminal narrowing.

L4-L5: Previous left laminotomy surgical changes. Mild facet and
ligamentum flavum hypertrophy. Moderate diffuse disc bulge which is
eccentric to the left. Mild spinal canal stenosis with effacement of
the left lateral recess as well as moderate right and severe left
neural foraminal narrowing.

L5-S1: Moderate diffuse disc bulge. No significant spinal canal
stenosis. Mild-to-moderate bilateral neural foraminal narrowing,
right worse than left.
IMPRESSION: 1. No acute osseous abnormality identified.
2. Degenerative changes in the lower lumbar spine as described.

## 2021-10-07 MED ORDER — LIDOCAINE 5 % EX PTCH: 1.0000 | MEDICATED_PATCH | CUTANEOUS | Status: DC

## 2021-10-07 MED ORDER — LIDOCAINE 5 % EX PTCH: 1.0000 | MEDICATED_PATCH | CUTANEOUS | 0 refills | Status: DC

## 2021-10-07 MED ORDER — IBUPROFEN 800 MG PO TABS: 800.0000 mg | ORAL_TABLET | Freq: Three times a day (TID) | ORAL | 0 refills | Status: DC

## 2021-10-07 NOTE — ED Triage Notes (Signed)
Pt reports having back surgery six months ago and since then has still had back pain and also muscle contractions in his L leg. Taking pain meds without relief.

## 2021-10-07 NOTE — ED Provider Triage Note (Signed)
Emergency Medicine Provider Triage Evaluation Note  Isaac Fletcher , a 41 y.o. male  was evaluated in triage.  Pt complains of back pain and muscle contractions in L leg.  Patient reports having back surgery 6 months ago, and has had the symptoms since then.  He has been trying to take pain medicine without relief.  He has attempted to see his orthopedic surgeon but states that the doctor just tells him to walk more, but this is not helping.  [place] interpreter used in triage  Review of Systems  Positive: Back pain, left leg intermittent pain/numbness Negative: Weakness, saddle anesthesia, urinary sx  Physical Exam  BP (!) 142/88    Pulse 62    Temp 98.3 F (36.8 C) (Oral)    Resp 14    SpO2 100%  Gen:   Awake, no distress   Resp:  Normal effort  MSK:   Moves extremities without difficulty  Other:    Medical Decision Making  Medically screening exam initiated at 12:55 PM.  Appropriate orders placed.  Isaac Fletcher was informed that the remainder of the evaluation will be completed by another provider, this initial triage assessment does not replace that evaluation, and the importance of remaining in the ED until their evaluation is complete.     [place] Welcome T, [name] 10/07/21 1255

## 2021-10-07 NOTE — ED Provider Notes (Signed)
[facility] EMERGENCY DEPARTMENT Provider Note   CSN: [id number] Arrival date & time: 10/07/21  1152     History  Chief Complaint  Patient presents with   Back Pain   Leg Pain    Isaac Fletcher is a 41 y.o. male.  Patient with history of lumbar foraminal stenosis status post lumbar microdiscectomy presents today with complaints of back pain. He states that same has been ongoing for the past 6 months since his surgery. States that pain is on the left side of his back and radiates down into his left calf. 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 He denies any numbness or tingling in his extremities or any weakness. He is ambulatory with some pain. Denies any loss of bowel or bladder function or sacral paresthesias.  No history of IVDU. States that he has seen his orthopedic surgeon who performed his surgery who has given him PT and gabapentin with muscle relaxers, but he states that this isnt touching his pain.  Upon chart review of ortho surgery's note it appears that the surgeon has gotten a new lumbar MRI in 10/22 which showed no acute findings or anything warranting surgery.  The history is provided by the patient. A language interpreter was used.  Back Pain Associated symptoms: leg pain   Associated symptoms: no fever   Leg Pain Associated symptoms: back pain   Associated symptoms: no fever and no neck pain       Home Medications Prior to Admission medications   Medication Sig Start Date End Date Taking? Authorizing Provider  gabapentin (NEURONTIN) 300 MG capsule Take 1 capsule (300 mg total) by mouth 3 (three) times daily. 07/25/21   Frederica Kuster, MD  methocarbamol (ROBAXIN) 500 MG tablet Take 1 tablet (500 mg total) by mouth every 8 (eight) hours as needed for muscle spasms. 07/25/21   Frederica Kuster, MD      Allergies    Patient has no known allergies.    Review of  Systems   Review of Systems  Constitutional:  Negative for chills and fever.  Gastrointestinal:  Negative for diarrhea, nausea and vomiting.  Musculoskeletal:  Positive for back pain. Negative for gait problem, neck pain and neck stiffness.  Skin:  Negative for rash and wound.  All other systems reviewed and are negative.  Physical Exam Updated Vital Signs BP (!) 142/88    Pulse 62    Temp 98.3 F (36.8 C) (Oral)    Resp 14    SpO2 100%  Physical Exam Vitals and nursing note reviewed.  Constitutional:      Appearance: Normal appearance. He is normal weight.     Comments: Patient resting comfortably in his chair no acute distress  HENT:     Head: Normocephalic and atraumatic.  Cardiovascular:     Rate and Rhythm: Normal rate.  Pulmonary:     Effort: Pulmonary effort is normal. No respiratory distress.  Abdominal:     General: Abdomen is flat.     Palpations: Abdomen is soft.  Musculoskeletal:     Right lower leg: No edema.     Left lower leg: No edema.     Comments: No step-offs, deformity, or midline tenderness noted to cervical, thoracic, or lumbar spine.  Some mild tenderness and muscular tightness noted to paraspinous muscles of the left lower back.  Positive straight leg raise on the left side.  Patient able to stand and walk around with normal gait.  He is also able to bend over with some discomfort.  5/5 strength and sensation noted to bilateral upper and lower extremities.  Skin:    General: Skin is warm and dry.  Neurological:     General: No focal deficit present.     Mental Status: He is alert.  Psychiatric:        Mood and Affect: Mood normal.        Behavior: Behavior normal.    ED Results / Procedures / Treatments   Labs (all labs ordered are listed, but only abnormal results are displayed) Labs Reviewed - No data to display  EKG None  Radiology CT Lumbar Spine Wo Contrast  Result Date: 10/07/2021 CLINICAL DATA:  Low back pain EXAM: CT LUMBAR SPINE  WITHOUT CONTRAST TECHNIQUE: Multidetector CT imaging of the lumbar spine was performed without intravenous contrast administration. Multiplanar CT image reconstructions were also generated. RADIATION DOSE REDUCTION: This exam was performed according to the departmental dose-optimization program which includes automated exposure control, adjustment of the mA and/or kV according to patient size and/or use of iterative reconstruction technique. COMPARISON:  MRI lumbar spine 07/06/2021 FINDINGS: Segmentation: 5 lumbar type vertebrae. Alignment: Normal. Vertebrae: No acute fracture or focal pathologic process. Paraspinal and other soft tissues: No acute paraspinal soft tissue abnormality identified. Disc levels: Limited evaluation with CT. L1-L2, L2-L3: No disc herniation, spinal canal stenosis or neural foraminal narrowing. L3-L4: Mild facet and ligamentum flavum hypertrophy. Mild broad-based disc bulge. No significant spinal canal stenosis or neural foraminal narrowing. L4-L5: Previous left laminotomy surgical changes. Mild facet and ligamentum flavum hypertrophy. Moderate diffuse disc bulge which is eccentric to the left. Mild spinal canal stenosis with effacement of the left lateral recess as well as moderate right and severe left neural foraminal narrowing. L5-S1: Moderate diffuse disc bulge. No significant spinal canal stenosis. Mild-to-moderate bilateral neural foraminal narrowing, right worse than left. IMPRESSION: 1. No acute osseous abnormality identified. 2. 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 Electronically Signed   By: Jannifer Hickelaney  Williams M.D.   On: 10/07/2021 13:53    Procedures Procedures    Medications Ordered in ED Medications - No data to display  ED Course/ Medical Decision Making/ A&P                           Medical Decision Making  Patient presents today with back pain for the last 6 months that has been unchanged during that time.  He is seen Ortho surgery who  got an MRI of his low back and stated that there were no acute surgical findings.  Gave him supportive care and physical therapy referral.  I, Lurena NidaSarah Gaylen Venning, [name], personally reviewed and evaluated these images and lab results supported by medical decision making  CT lumbar spine without contrast ordered in the ER today which revealed no acute osseous abnormalities or significant spinal stenosis.   Patient is afebrile, nontoxic-appearing, and in no acute distress with reassuring vital signs.  No red flag symptoms present, therefore low suspicion of cauda equina or epidural abscess at this time.  No neurological deficits and normal neuro exam.  Patient can walk but states is painful.  No loss of bowel or bladder control.  No concern for cauda equina.  No fever, night sweats, weight loss, h/o cancer, IVDU.  RICE protocol and pain medicine indicated and discussed with patient.  Also given neurosurgery  referral for further evaluation.  Patient stable for discharge at this time, educated on red flag symptoms of prompt immediate return.  Discharged in stable condition.   Final Clinical Impression(s) / ED Diagnoses Final diagnoses:  Chronic left-sided low back pain with left-sided sciatica    Rx / DC Orders ED Discharge Orders          Ordered    ibuprofen (ADVIL) 800 MG tablet  3 times daily        10/07/21 1649    lidocaine (LIDODERM) 5 %  Every 24 hours        10/07/21 1652          An After Visit Summary was printed and given to the patient.     Vear Clock 10/07/21 1926    Pollyann Savoy, MD 10/08/21 [phone]

## 2021-10-07 NOTE — ED Notes (Signed)
Pt verbalized understanding of d/c instructions, meds and followup care. Denies questions. VSS, no distress noted. Steady gait to exit with all belongings.  ?

## 2021-10-07 NOTE — Discharge Instructions (Addendum)
Votre bilan d'aujourd'hui tait rassurant pour les anomalies aigus. Je vous ai rfr  la neurochirurgie pour [employer] plus approfondie et la gestion de vos symptmes. En attendant, je vous ai donn une ordonnance pour un mdicament anti-inflammatoire et un patch de lidocane. Veuillez les prendre tel que prescrit au besoin pour [employer]. Je vous ai galement donn des exercices d'tirement que vous pouvez essayer  la maison pour un soulagement supplmentaire. Retour en cas de dveloppement de nouveaux symptmes ou d'aggravation de symptmes

## 2021-10-10 ENCOUNTER — Other Ambulatory Visit: Payer: Self-pay

## 2021-10-10 ENCOUNTER — Ambulatory Visit: Payer: Self-pay | Attending: Family Medicine

## 2021-10-10 DIAGNOSIS — M545 Low back pain, unspecified: Secondary | ICD-10-CM

## 2021-10-10 DIAGNOSIS — M6281 Muscle weakness (generalized): Secondary | ICD-10-CM

## 2021-10-10 DIAGNOSIS — G8929 Other chronic pain: Secondary | ICD-10-CM | POA: Insufficient documentation

## 2021-10-10 NOTE — Therapy (Signed)
OUTPATIENT PHYSICAL THERAPY TREATMENT NOTE   Patient Name: Isaac Fletcher MRN: [id number] DOB:27-Dec-1980, 41 y.o., male Today's Date: 10/10/2021  PCP: Frederica Kuster, MD REFERRING PROVIDER: Frederica Kuster, MD     Past Medical History:  Diagnosis Date   Back pain    Constipation    H/O hernia repair    Past Surgical History:  Procedure Laterality Date   HERNIA REPAIR     Herniated Disc     LUMBAR LAMINECTOMY N/A 03/21/2021   Procedure: LEFT LEFT FOUR-FIVE MICRODISCECTOMY;  Surgeon: [name], MD;  Location: [facility] OR;  Service: Orthopedics;  Laterality: N/A;   Patient Active Problem List   Diagnosis Date Noted   Status post lumbar microdiscectomy 04/08/2021   Surgery, elective    Lumbar foraminal stenosis 02/19/2021   Trochanteric bursitis, left hip 02/19/2021    REFERRING DIAG: Muscle weakness (generalized)  Chronic low back pain without sciatica, unspecified back pain laterality  THERAPY DIAG:  No diagnosis found.  PERTINENT HISTORY: 1. Status post lumbar microdiscectomy Has seen orthopedic surgery without any further treatment suggested.  We will try to increase gabapentin to take 600 mg at bedtime but continue 300 in a.m. and after lunch.  Also will request physical therapy referral again. Discussed possibility that he may need to have more sedentary work going forward.  May need more job training to do other work.    PRECAUTIONS: none noted  SUBJECTIVE: Arrives today with c/o LLE tightness localized to thigh and medial foreleg, denies low back pain and does not point to thoracic region as site of symptoms  PAIN:  Are you having pain? Yes NPRS scale: 8/10 Pain location: L thoracic Pain orientation: Left  PAIN TYPE: aching and dull Pain description: intermittent  Aggravating factors: activity Relieving factors: rest     TODAY'S TREATMENT  [facility] Adult PT Treatment:                                                DATE: 10/10/21 Therapeutic  Exercise: Nustep L2 8 min arms 10 seat 12 L piriformis stretch, figure 4, 30s x3 Bridge x30 with some L lumbosacral discomfort Curl ups x30 Prone press 2x10 with OP Open book 15x B  Upper thoracic rotation 15x B performed in quadruped Negative SLR, slump or ITB symptoms LLE Manual Therapy: PA mobs to L5-2 GIII-IV 10x each segment  [facility] Adult PT Treatment:                                                DATE: 10/03/21 Therapeutic Exercise: Nustep L4  Prone press 2x10 with OP Open book 10x B  Upper thoracic rotation 10x B Manual Therapy: PA mobs to L5-2, grade II-III, 5x each segment    PATIENT EDUCATION:  Education details: Discussed eval findings, rehab rationale and POC and patient is in agreement  Person educated: Patient Education method: Explanation Education comprehension: verbalized understanding, verbal cues required, tactile cues required, and needs further education     HOME EXERCISE PROGRAM: Access Code: [email or web address] URL: [email or web address] Date: 10/03/2021 Prepared by: Gustavus Bryant  Exercises Supine Piriformis Stretch with Foot on Ground - 2 x daily - 7 x weekly - 1 sets - 3 reps -  30s hold Sidelying Open Book Thoracic Lumbar Rotation and Extension - 2 x daily - 7 x weekly - 1 sets - 10 reps Quadruped Full Range Thoracic Rotation with Reach - 2 x daily - 7 x weekly - 1 sets - 10 reps Quadruped Thoracic Rotation - Reach Under - 2 x daily - 7 x weekly - 1 sets - 10 reps Prone Press Up - 5 x daily - 7 x weekly - 1 sets - 10 reps      ASSESSMENT:   CLINICAL IMPRESSION:  Today's symptoms cited in anterior LLE without reports of thoracic symptoms, difficult to reproduce in clinic, no ITB irritation, no quadriceps Tps, good hip flexor mobility. Continued to emphasize spinal mobility focused on extension.  R lateral shift observed with no change in symptoms elicited with lateral glides.    REHAB POTENTIAL: Good   CLINICAL DECISION MAKING:  Stable/uncomplicated   EVALUATION COMPLEXITY: Low     GOALS: Goals reviewed with patient? Yes   SHORT TERM GOALS:   STG Name Target Date Goal status  1 Patient to demonstrate independence in HEP  Baseline: Access Code: [facility] 10/13/2021 INITIAL  2 Resolve lateral shift/assess leg length Baseline: R lateral shift present 10/13/2021 10/10/21 No leg length discrepancy but R lateral shift present  LONG TERM GOALS:    LTG Name Target Date Goal status  1 Increase lumbar flexion to 90% Baseline: 11/03/2021 INITIAL  2 Decrease 5x STS score to <15s Baseline: Initial time 17s w/o UE support 11/03/2021 INITIAL  3 Resolve LLE heaviness  Baseline: 2/10 rated heaviness 11/03/2021 INITIAL  PLAN: PT FREQUENCY: 1x/week   PT DURATION: 6   PLANNED INTERVENTIONS: Therapeutic exercises, Therapeutic activity, Neuro Muscular re-education, Balance training, Gait training, Patient/Family education, Joint mobilization, and Stair training   PLAN FOR NEXT SESSION: Continue prone press, f/u on thoracic mobility and tight LS paraspinals, emphasize FF and core strength    Hildred Laser, PT 10/10/2021, 10:46 AM

## 2021-10-16 ENCOUNTER — Other Ambulatory Visit: Payer: Self-pay | Admitting: Orthopaedic Surgery

## 2021-10-16 DIAGNOSIS — M4807 Spinal stenosis, lumbosacral region: Secondary | ICD-10-CM

## 2021-10-17 ENCOUNTER — Other Ambulatory Visit: Payer: Self-pay

## 2021-10-17 ENCOUNTER — Ambulatory Visit: Payer: Self-pay

## 2021-10-17 DIAGNOSIS — M545 Low back pain, unspecified: Secondary | ICD-10-CM

## 2021-10-17 DIAGNOSIS — M6281 Muscle weakness (generalized): Secondary | ICD-10-CM

## 2021-10-17 DIAGNOSIS — G8929 Other chronic pain: Secondary | ICD-10-CM

## 2021-10-17 NOTE — Therapy (Addendum)
OUTPATIENT PHYSICAL THERAPY TREATMENT NOTE   Patient Name: Isaac Fletcher MRN: [id number] DOB:07/10/1981, 41 y.o., male Today's Date: 10/17/2021  PCP: Wardell Honour, MD REFERRING PROVIDER: Wardell Honour, MD     10/17/21 1229  PT Visits / Re-Eval  Visit Number 4  Number of Visits 6  Date for PT Re-Evaluation 11/17/21  Authorization  Authorization Type CAFA  Authorization Time Period 09/22/21-11/17/21  Progress Note Due on Visit 8  PT Time Calculation  PT Start Time 1045  PT Stop Time 1130  PT Time Calculation (min) 45 min  PT - End of Session  Activity Tolerance Patient tolerated treatment well  Behavior During Therapy WFL for tasks assessed/performed    Past Medical History:  Diagnosis Date   Back pain    Constipation    H/O hernia repair    Past Surgical History:  Procedure Laterality Date   HERNIA REPAIR     Herniated Disc     LUMBAR LAMINECTOMY N/A 03/21/2021   Procedure: LEFT LEFT FOUR-FIVE MICRODISCECTOMY;  Surgeon: Marybelle Killings, MD;  Location: [place];  Service: Orthopedics;  Laterality: N/A;   Patient Active Problem List   Diagnosis Date Noted   Status post lumbar microdiscectomy 04/08/2021   Surgery, elective    Lumbar foraminal stenosis 02/19/2021   Trochanteric bursitis, left hip 02/19/2021    REFERRING DIAG: Muscle weakness (generalized)  Chronic low back pain without sciatica, unspecified back pain laterality  THERAPY DIAG:  No diagnosis found.  PERTINENT HISTORY: 1. Status post lumbar microdiscectomy Has seen orthopedic surgery without any further treatment suggested.  We will try to increase gabapentin to take 600 mg at bedtime but continue 300 in a.m. and after lunch.  Also will request physical therapy referral again. Discussed possibility that he may need to have more sedentary work going forward.  May need more job training to do other work.    PRECAUTIONS: none noted  SUBJECTIVE: No change in symptoms, frustrated that symptoms  have hot resolved.  Interpreter service used during session to more accurately capture information.  PAIN:  Are you having pain? Yes NPRS scale: 8/10 Pain location: L thoracic Pain orientation: Left  PAIN TYPE: aching and dull Pain description: intermittent  Aggravating factors: activity Relieving factors: rest  OBJECTIVE:    DIAGNOSTIC FINDINGS:  1. Status post lumbar microdiscectomy Has seen orthopedic surgery without any further treatment suggested.  We will try to increase gabapentin to take 600 mg at bedtime but continue 300 in a.m. and after lunch.  Also will request physical therapy referral again. Discussed possibility that he may need to have more sedentary work going forward.  May need more job training to do other work.   PATIENT SURVEYS:  N/a due to CAFA   SCREENING FOR RED FLAGS: Bowel or bladder incontinence: No   COGNITION:          Overall cognitive status: Within functional limits for tasks assessed                        SENSATION:          Light touch: Appears intact   MUSCLE LENGTH: Hamstrings: Right 90 deg; Left 90 deg PKB negative B   POSTURE:  R lateral shift   PALPATION: unremarkable   LUMBARAROM/PROM   A/PROM A/PROM  09/22/2021  Flexion 90%  Extension WFL  Right lateral flexion WFL  Left lateral flexion WFL  Right rotation  90%  Left rotation  90%   (Blank rows = not tested)   LE AROM/PROM:   A/PROM Right 09/22/2021 Left 09/22/2021  Hip flexion [facility]  Hip extension [facility]  Hip abduction      Hip adduction      Hip internal rotation      Hip external rotation      Knee flexion [facility]  Knee extension [facility]  Ankle dorsiflexion      Ankle plantarflexion      Ankle inversion      Ankle eversion       (Blank rows = not tested)   LE MMT: LE strength [facility]   MMT Right 09/22/2021 Left 09/22/2021  Hip flexion [facility]  Hip extension [facility]  Hip abduction      Hip adduction      Hip internal rotation      Hip external  rotation      Knee flexion [facility] WFL  Knee extension [facility] WFL  Ankle dorsiflexion      Ankle plantarflexion      Ankle inversion      Ankle eversion       (Blank rows = not tested)   LUMBAR SPECIAL TESTS:  Straight leg raise test: Negative, Slump test: Negative, FABER test: Negative, and Thomas test: Negative   FUNCTIONAL TESTS:  5 times sit to stand: 17s   GAIT: Distance walked: 150 Assistive device utilized: None Level of assistance: Complete Independence      TODAY'S TREATMENT   [facility] Adult PT Treatment:                                                DATE: 10/17/21 Therapeutic Exercise:   B hamstring stretch to 90d   Figure 4 stretch LLE    Prone L hip flexor stretch   Lumbar AROM showing full ROM in all directions with only symptoms reported with R rotation   Prone press x10   L piriformis stretch    Manual Therapy: PA mobs L5-1 Grade III 10x at each segment  Lumbosacral mobilization thrust into R rotation x3  [facility] Adult PT Treatment:                                                DATE: 10/10/21 Therapeutic Exercise: Nustep L2 8 min arms 10 seat 12 L piriformis stretch, figure 4, 30s x3 Bridge x30 with some L lumbosacral discomfort Curl ups x30 Prone press 2x10 with OP Open book 15x B  Upper thoracic rotation 15x B performed in quadruped Negative SLR, slump or ITB symptoms LLE Manual Therapy: PA mobs to L5-2 GIII-IV 10x each segment  [facility] Adult PT Treatment:                                                DATE: 10/03/21 Therapeutic Exercise: Nustep L4 20mn  Prone press 2x10 with OP Open book 10x B  Upper thoracic rotation 10x B Manual Therapy: PA mobs to L5-2, grade II-III, 5x each segment    PATIENT EDUCATION:  Education details: Discussed eval findings, rehab rationale  patient is in agreement  °Person educated: Patient °Education method: Explanation °Education comprehension: verbalized understanding, verbal cues required, tactile cues required,  and needs further education °  °  °HOME EXERCISE PROGRAM: °Access Code: [email or web address] °URL: [email or web address] °Date: 10/03/2021 °Prepared by: Jeffrey Ziemba ° °Exercises °Supine Piriformis Stretch with Foot on Ground - 2 x daily - 7 x weekly - 1 sets - 3 reps - 30s hold °Sidelying Open Book Thoracic Lumbar Rotation and Extension - 2 x daily - 7 x weekly - 1 sets - 10 reps °Quadruped Full Range Thoracic Rotation with Reach - 2 x daily - 7 x weekly - 1 sets - 10 reps °Quadruped Thoracic Rotation - Reach Under - 2 x daily - 7 x weekly - 1 sets - 10 reps °Prone Press Up - 5 x daily - 7 x weekly - 1 sets - 10 reps ° °  °  °ASSESSMENT: °  °CLINICAL IMPRESSION:  Today's symptoms cited in anterior LLE without reports of thoracic symptoms, difficult to reproduce in clinic, no ITB irritation, no quadriceps TPs, good hip flexor mobility.  Discomfort reported with R rotation in sitting, attempted manual technique to resolve.  Postural exam shows R rib hump with FF and R lateral shift suggestive of possible scoliotic changes.  Patient to f/u with MD prior to return to [facility]   °  °REHAB POTENTIAL: Good °  °CLINICAL DECISION MAKING: Stable/uncomplicated °  °EVALUATION COMPLEXITY: Low °  °  °GOALS: °Goals reviewed with patient? Yes °  °SHORT TERM GOALS: °  °STG Name Target Date Goal status  °1 Patient to demonstrate independence in HEP  °Baseline: Access Code: [email or web address] 10/13/2021 10/17/21 Goal met  °2 Resolve lateral shift/assess leg length °Baseline: R lateral shift present 10/13/2021 10/10/21 No leg length discrepancy but R lateral shift present  °LONG TERM GOALS:  °  °LTG Name Target Date Goal status  °1 Increase lumbar flexion to 90% °Baseline: 11/03/2021 Goal met  °2 Decrease 5x STS score to <15s °Baseline: Initial time 17s w/o UE support 11/03/2021 INITIAL  °3 Resolve LLE heaviness  °Baseline: 2/10 rated heaviness 11/03/2021 INITIAL  °PLAN: °PT FREQUENCY: 1x/week °  °PT DURATION: 6 °  °PLANNED INTERVENTIONS: Therapeutic  exercises, Therapeutic activity, Neuro Muscular re-education, Balance training, Gait training, Patient/Family education, Joint mobilization, and Stair training °  °PLAN FOR NEXT SESSION: Continue prone press, f/u on thoracic mobility and tight LS paraspinals, emphasize FF and core strength, MD f/u? ° ° ° °Jeffrey M Ziemba, PT °10/17/2021, 12:28 PM ° °   °

## 2021-10-23 ENCOUNTER — Ambulatory Visit (INDEPENDENT_AMBULATORY_CARE_PROVIDER_SITE_OTHER): Payer: Self-pay | Admitting: Surgery

## 2021-10-23 ENCOUNTER — Encounter: Payer: Self-pay | Admitting: Surgery

## 2021-10-23 ENCOUNTER — Other Ambulatory Visit: Payer: Self-pay

## 2021-10-23 VITALS — BP 116/68 | HR 71 | Ht 72.0 in | Wt 187.4 lb

## 2021-10-23 DIAGNOSIS — Z9889 Other specified postprocedural states: Secondary | ICD-10-CM

## 2021-10-23 DIAGNOSIS — M5126 Other intervertebral disc displacement, lumbar region: Secondary | ICD-10-CM

## 2021-10-23 DIAGNOSIS — M7062 Trochanteric bursitis, left hip: Secondary | ICD-10-CM

## 2021-10-23 MED ORDER — BUPIVACAINE HCL 0.25 % IJ SOLN
6.0000 mL | INTRAMUSCULAR | Status: AC | PRN
  Administered 2021-10-23: 6 mL via INTRA_ARTICULAR

## 2021-10-23 MED ORDER — METHYLPREDNISOLONE ACETATE 40 MG/ML IJ SUSP
80.0000 mg | INTRAMUSCULAR | Status: AC | PRN
  Administered 2021-10-23: 80 mg via INTRA_ARTICULAR

## 2021-10-23 MED ORDER — LIDOCAINE HCL 1 % IJ SOLN
3.0000 mL | INTRAMUSCULAR | Status: AC | PRN
  Administered 2021-10-23: 3 mL

## 2021-10-23 NOTE — Progress Notes (Signed)
Office Visit Note   Patient: Isaac Fletcher           Date of Birth: 1981-07-07           MRN: [id number] Visit Date: 10/23/2021              Requested by: Frederica Kuster, MD [address] [place],  [place] [address] PCP: Frederica Kuster, MD   Assessment & Plan: Visit Diagnoses:  1. Protrusion of lumbar intervertebral disc   2. S/P lumbar microdiscectomy   3. Greater trochanteric bursitis of left hip     Plan: In hopes of trying to sort out patient's pain today in the clinic I offered a left hip greater trochanter bursa injection.  After patient consent left lateral hip was prepped with Betadine and Marcaine/Depo-Medrol 6 to 2 injection was performed.  After sitting for few minutes patient reported very good relief of his lateral hip pain with anesthetic in place and his gait also improved.  Since I do think he is also having issues at [name] I will also schedule him for left [name] transforaminal ESI with Dr. Alvester Morin.  I will have patient follow-up with me in 4 weeks for recheck and then we will discuss further options at that point.  Follow-Up Instructions: Return in about 4 weeks (around 11/20/2021) for with [facility] for recheck after lumbar ESI and hip injection.   Orders:  Orders Placed This Encounter  Procedures   Large Joint Inj   Ambulatory referral to Physical Medicine Rehab   No orders of the defined types were placed in this encounter.     Procedures: Large Joint Inj: L greater trochanter on 10/23/2021 10:53 AM Details: 22 G 1.5 in needle, lateral approach Medications: 3 mL lidocaine 1 %; 6 mL bupivacaine 0.25 %; 80 mg methylPREDNISolone acetate 40 MG/ML Aspirate: clear Consent was given by the patient. Patient was prepped and draped in the usual sterile fashion.      Clinical Data: No additional findings.   Subjective: Chief Complaint  Patient presents with   Lower Back - Pain    HPI 41 year old male comes in with interpreter today.  He is status post left  [name] microdiscectomy by Dr. Ophelia Charter March 21, 2021.  Patient states that for 3 months postop he was doing well but then his symptoms returned.  He was recently seen in the emergency department October 07, 2021 for ongoing low back pain and left leg pain and CT scan was performed which showed:  CLINICAL DATA:  Low back pain   EXAM: CT LUMBAR SPINE WITHOUT CONTRAST   TECHNIQUE: Multidetector CT imaging of the lumbar spine was performed without intravenous contrast administration. Multiplanar CT image reconstructions were also generated.   RADIATION DOSE REDUCTION: This exam was performed according to the departmental dose-optimization program which includes automated exposure control, adjustment of the mA and/or kV according to patient size and/or use of iterative reconstruction technique.   COMPARISON:  MRI lumbar spine 07/06/2021   FINDINGS: Segmentation: 5 lumbar type vertebrae.   Alignment: Normal.   Vertebrae: No acute fracture or focal pathologic process.   Paraspinal and other soft tissues: No acute paraspinal soft tissue abnormality identified.   Disc levels: Limited evaluation with CT.   L1-L2, L2-L3: No disc herniation, spinal canal stenosis or neural foraminal narrowing.   L3-L4: Mild facet and ligamentum flavum hypertrophy. Mild broad-based disc bulge. No significant spinal canal stenosis or neural foraminal narrowing.   L4-L5: Previous left laminotomy surgical changes.  Mild facet and ligamentum flavum hypertrophy. Moderate diffuse disc bulge which is eccentric to the left. Mild spinal canal stenosis with effacement of the left lateral recess as well as moderate right and severe left neural foraminal narrowing.   L5-S1: Moderate diffuse disc bulge. No significant spinal canal stenosis. Mild-to-moderate bilateral neural foraminal narrowing, right worse than left.   IMPRESSION: 1. No acute osseous abnormality identified. 2. Degenerative changes in the lower  lumbar spine as described.     Electronically Signed   By: Jannifer Hick M.D.   On: 10/07/2021 13:53    Today patient is complaining of left-sided low back pain that radiates into his buttock and down toward his ankle.  He also complains of left thigh pain where he localizes to the lateral hip greater trochanter bursa and this extends down his IT band to the knee.  Pain when he is ambulating.  He has taken ibuprofen without any improvement.  Last seen by Dr. Ophelia Charter August 12, 2021. Review of Systems No current cardiopulmonary GI/GU issues  Objective: Vital Signs: BP 116/68    Pulse 71    Ht 6' (1.829 m)    Wt 187 lb 6.3 oz (85 kg)    BMI 25.41 kg/m   Physical Exam HENT:     Head: Normocephalic and atraumatic.     Nose: Nose normal.  Eyes:     Extraocular Movements: Extraocular movements intact.  Pulmonary:     Effort: No respiratory distress.  Musculoskeletal:     Comments: Patient has a Trendelenburg gait due to left lateral hip pain.  He is tender along the left lumbar paraspinal muscles.  Positive left-sided notch tenderness.  He has moderate to marked tenderness over the left hip greater trochanter bursa and this extends down the IT band to his knee.  Negative on the right side.  Negative logroll bilateral hips.  Negative straight leg raise.  He does have tight hamstring on the left side.  No focal motor deficits.  Neurological:     Mental Status: He is alert and oriented to person, place, and time.  Psychiatric:        Mood and Affect: Mood normal.    Ortho Exam  Specialty Comments:  No specialty comments available.  Imaging: No results found.   PMFS History: Patient Active Problem List   Diagnosis Date Noted   Status post lumbar microdiscectomy 04/08/2021   Surgery, elective    Lumbar foraminal stenosis 02/19/2021   Trochanteric bursitis, left hip 02/19/2021   Past Medical History:  Diagnosis Date   Back pain    Constipation    H/O hernia repair      Family History  Problem Relation Age of Onset   Diabetes Father     Past Surgical History:  Procedure Laterality Date   HERNIA REPAIR     Herniated Disc     LUMBAR LAMINECTOMY N/A 03/21/2021   Procedure: LEFT LEFT FOUR-FIVE MICRODISCECTOMY;  Surgeon: [name], MD;  Location: [facility] OR;  Service: Orthopedics;  Laterality: N/A;   Social History   Occupational History   Not on file  Tobacco Use   Smoking status: Never   Smokeless tobacco: Never  Vaping Use   Vaping Use: Never used  Substance and Sexual Activity   Alcohol use: Yes   Drug use: Never   Sexual activity: Not on file

## 2021-10-24 ENCOUNTER — Ambulatory Visit: Payer: Self-pay

## 2021-10-29 ENCOUNTER — Other Ambulatory Visit (HOSPITAL_COMMUNITY): Payer: Self-pay

## 2021-10-31 ENCOUNTER — Ambulatory Visit: Payer: Self-pay

## 2021-11-07 ENCOUNTER — Other Ambulatory Visit: Payer: Self-pay

## 2021-11-07 ENCOUNTER — Ambulatory Visit: Payer: Self-pay | Attending: Family Medicine

## 2021-11-07 DIAGNOSIS — G8929 Other chronic pain: Secondary | ICD-10-CM | POA: Insufficient documentation

## 2021-11-07 DIAGNOSIS — M545 Low back pain, unspecified: Secondary | ICD-10-CM | POA: Insufficient documentation

## 2021-11-07 DIAGNOSIS — M6281 Muscle weakness (generalized): Secondary | ICD-10-CM | POA: Insufficient documentation

## 2021-11-07 NOTE — Therapy (Signed)
OUTPATIENT PHYSICAL THERAPY TREATMENT NOTE/DC SUMMARY   Patient Name: Isaac Fletcher MRN: [id number] DOB:10/12/1980, 41 y.o., male Today's Date: 11/07/2021  PCP: Wardell Honour, MD REFERRING PROVIDER: Wardell Honour, MD  PHYSICAL THERAPY DISCHARGE SUMMARY  Visits from Start of Care: 5  Current functional level related to goals / functional outcomes: Goals partially met, pain unresolved   Remaining deficits: Pain   Education / Equipment: HEP   Patient agrees to discharge. Patient goals were partially met. Patient is being discharged due to did not respond to therapy.     PT End of Session - 11/07/21 0921     Visit Number 5    Number of Visits 6    Date for PT Re-Evaluation 11/17/21    Authorization Type CAFA    Authorization Time Period 09/22/21-11/17/21    Progress Note Due on Visit 8    PT Start Time 0915    PT Stop Time 1000    PT Time Calculation (min) 45 min    Activity Tolerance Patient tolerated treatment well    Behavior During Therapy WFL for tasks assessed/performed               Past Medical History:  Diagnosis Date   Back pain    Constipation    H/O hernia repair    Past Surgical History:  Procedure Laterality Date   HERNIA REPAIR     Herniated Disc     LUMBAR LAMINECTOMY N/A 03/21/2021   Procedure: LEFT LEFT FOUR-FIVE MICRODISCECTOMY;  Surgeon: Marybelle Killings, MD;  Location: [facility];  Service: Orthopedics;  Laterality: N/A;   Patient Active Problem List   Diagnosis Date Noted   Status post lumbar microdiscectomy 04/08/2021   Surgery, elective    Lumbar foraminal stenosis 02/19/2021   Trochanteric bursitis, left hip 02/19/2021    REFERRING DIAG: Muscle weakness (generalized)  Chronic low back pain without sciatica, unspecified back pain laterality  THERAPY DIAG:  Muscle weakness (generalized)  Chronic low back pain without sciatica, unspecified back pain laterality  PERTINENT HISTORY: 1. Status post lumbar microdiscectomy Has  seen orthopedic surgery without any further treatment suggested.  We will try to increase gabapentin to take 600 mg at bedtime but continue 300 in a.m. and after lunch.  Also will request physical therapy referral again. Discussed possibility that he may need to have more sedentary work going forward.  May need more job training to do other work.    PRECAUTIONS: none noted  SUBJECTIVE: interpreter Vicente Males [phone].  Reports 7/10 pain, symptoms onset with prolonged walking and standing.  Symptoms relieved with sitting.  Will undergo ESI injections on 3/10. PAIN:  Are you having pain? Yes NPRS scale: 7/10 Pain location: L thoracic Pain orientation: Left  PAIN TYPE: aching and dull Pain description: intermittent  Aggravating factors: activity Relieving factors: rest  OBJECTIVE:    DIAGNOSTIC FINDINGS:  1. Status post lumbar microdiscectomy Has seen orthopedic surgery without any further treatment suggested.  We will try to increase gabapentin to take 600 mg at bedtime but continue 300 in a.m. and after lunch.  Also will request physical therapy referral again. Discussed possibility that he may need to have more sedentary work going forward.  May need more job training to do other work.   PATIENT SURVEYS:  N/a due to CAFA   SCREENING FOR RED FLAGS: Bowel or bladder incontinence: No   COGNITION:          Overall cognitive status: Within functional limits for  tasks assessed                        SENSATION:          Light touch: Appears intact   MUSCLE LENGTH: Hamstrings: Right 90 deg; Left 90 deg PKB negative B   POSTURE:  R lateral shift   PALPATION: unremarkable   LUMBARAROM/PROM   A/PROM A/PROM  09/22/2021  Flexion 90%  Extension WFL  Right lateral flexion WFL  Left lateral flexion WFL  Right rotation  90%  Left rotation  90%   (Blank rows = not tested)   LE AROM/PROM:   A/PROM Right 09/22/2021 Left 09/22/2021  Hip flexion [facility]  Hip extension [facility]  Hip  abduction      Hip adduction      Hip internal rotation      Hip external rotation      Knee flexion [facility]  Knee extension [name]  Ankle dorsiflexion      Ankle plantarflexion      Ankle inversion      Ankle eversion       (Blank rows = not tested)   LE MMT: LE strength [facility]   MMT Right 09/22/2021 Left 09/22/2021  Hip flexion [facility] WFL  Hip extension [facility]  Hip abduction      Hip adduction      Hip internal rotation      Hip external rotation      Knee flexion WFL WFL  Knee extension [facility] WFL  Ankle dorsiflexion      Ankle plantarflexion      Ankle inversion      Ankle eversion       (Blank rows = not tested)   LUMBAR SPECIAL TESTS:  Straight leg raise test: Negative, Slump test: Negative, FABER test: Negative, and Thomas test: Negative   FUNCTIONAL TESTS:  5 times sit to stand: 17s   GAIT: Distance walked: 150 Assistive device utilized: None Level of assistance: Complete Independence      TODAY'S TREATMENT  [facility] Adult PT Treatment:                                                DATE: 11/07/21 Therapeutic Exercise: AROM lumbar spine including FF, B rotation Standing FF 10x Standing trunk extension 10x Prone press x10 with OP 5x STS 12s  Piriformis stretch 30s B SENSATION:  Light touch: Appears intact   MUSCLE LENGTH: Hamstrings: Right 90 deg; Left 90 deg  PALPATION: unremarkable  LUMBARAROM/PROM  A/PROM A/PROM  11/07/2021  Flexion WNL  Extension WNL  Right lateral flexion WNL  Left lateral flexion WNL  Right rotation WNL  Left rotation WNL   (Blank rows = not tested)  LE AROM/PROM:  A/PROM Right 11/07/2021 Left 11/07/2021  Hip flexion WNL WNL  Hip extension WNL WNL  Hip abduction    Hip adduction    Hip internal rotation    Hip external rotation    Knee flexion WNL WNL  Knee extension WNL WNL  Ankle dorsiflexion    Ankle plantarflexion    Ankle inversion    Ankle eversion     (Blank rows = not tested)  LE MMT:  MMT  Right 11/07/2021 Left 11/07/2021  Hip flexion WNL WNL  Hip extension WNL WNL  Hip abduction  Hip adduction    Hip internal rotation    Hip external rotation    Knee flexion WNL WNL  Knee extension WNL WNL  Ankle dorsiflexion    Ankle plantarflexion    Ankle inversion    Ankle eversion     (Blank rows = not tested)  LUMBAR SPECIAL TESTS:  Straight leg raise test: Negative, Slump test: Negative, SI Compression/distraction test: Negative, and FABER test: Negative  FUNCTIONAL TESTS:  5 times sit to stand: 12s  GAIT: Distance walked: 75x2 Assistive device utilized: None Level of assistance: Complete Independence      [facility] Adult PT Treatment:                                                DATE: 10/17/21 Therapeutic Exercise:   B hamstring stretch to 90d   Figure 4 stretch LLE    Prone L hip flexor stretch   Lumbar AROM showing full ROM in all directions with only symptoms reported with R rotation   Prone press x10   L piriformis stretch    Manual Therapy: PA mobs L5-1 Grade III 10x at each segment  Lumbosacral mobilization thrust into R rotation x3  [facility] Adult PT Treatment:                                                DATE: 10/10/21 Therapeutic Exercise: Nustep L2 8 min arms 10 seat 12 L piriformis stretch, figure 4, 30s x3 Bridge x30 with some L lumbosacral discomfort Curl ups x30 Prone press 2x10 with OP Open book 15x B  Upper thoracic rotation 15x B performed in quadruped Negative SLR, slump or ITB symptoms LLE Manual Therapy: PA mobs to L5-2 GIII-IV 10x each segment  [facility] Adult PT Treatment:                                                DATE: 10/03/21 Therapeutic Exercise: Nustep L4 42mn  Prone press 2x10 with OP Open book 10x B  Upper thoracic rotation 10x B Manual Therapy: PA mobs to L5-2, grade II-III, 5x each segment    PATIENT EDUCATION:  Education details: Discussed eval findings, rehab rationale and POC and patient is in agreement  Person  educated: Patient Education method: Explanation Education comprehension: verbalized understanding, verbal cues required, tactile cues required, and needs further education     HOME EXERCISE PROGRAM: Access Code: [email or web address] URL: [email or web address] Date: 10/03/2021 Prepared by: JSharlynn Oliphant Exercises Supine Piriformis Stretch with Foot on Ground - 2 x daily - 7 x weekly - 1 sets - 3 reps - 30s hold Sidelying Open Book Thoracic Lumbar Rotation and Extension - 2 x daily - 7 x weekly - 1 sets - 10 reps Quadruped Full Range Thoracic Rotation with Reach - 2 x daily - 7 x weekly - 1 sets - 10 reps Quadruped Thoracic Rotation - Reach Under - 2 x daily - 7 x weekly - 1 sets - 10 reps Prone Press Up - 5 x daily - 7 x weekly - 1 sets - 10 reps  ASSESSMENT:   CLINICAL IMPRESSION:  Patient presents with good AROM throughout LEs and trunk, neuro tensioning tests and repetitive flexion due not elicit patient symptoms, overall strength is functional throughout.  Recommend DC OPPT at this time and f/u with planned ESIs.   REHAB POTENTIAL: Good   CLINICAL DECISION MAKING: Stable/uncomplicated   EVALUATION COMPLEXITY: Low     GOALS: Goals reviewed with patient? Yes   SHORT TERM GOALS:   STG Name Target Date Goal status  1 Patient to demonstrate independence in HEP  Baseline: Access Code: [facility] 10/13/2021 10/17/21 Goal met  2 Resolve lateral shift/assess leg length Baseline: R lateral shift present 10/13/2021 10/10/21 No leg length discrepancy but R lateral shift present  LONG TERM GOALS:    LTG Name Target Date Goal status  1 Increase lumbar flexion to 90% Baseline: 11/03/2021 Goal met  2 Decrease 5x STS score to <15s Baseline: Initial time 17s w/o UE support; 11/06/21 12s  11/03/2021 Met  3 Resolve LLE heaviness  Baseline: 7/10 rated heaviness 11/03/2021 Deferred  PLAN: PT FREQUENCY: 1x/week   PT DURATION: 6   PLANNED INTERVENTIONS: Therapeutic exercises, Therapeutic  activity, Neuro Muscular re-education, Balance training, Gait training, Patient/Family education, Joint mobilization, and Stair training   PLAN FOR NEXT SESSION: DC to MD care    Lanice Shirts, PT 11/07/2021, 10:13 AM

## 2021-11-13 ENCOUNTER — Ambulatory Visit: Payer: Self-pay

## 2021-11-13 ENCOUNTER — Ambulatory Visit (INDEPENDENT_AMBULATORY_CARE_PROVIDER_SITE_OTHER): Payer: Self-pay | Admitting: Physical Medicine and Rehabilitation

## 2021-11-13 ENCOUNTER — Other Ambulatory Visit: Payer: Self-pay

## 2021-11-13 ENCOUNTER — Encounter: Payer: Self-pay | Admitting: Physical Medicine and Rehabilitation

## 2021-11-13 VITALS — BP 152/96 | HR 71

## 2021-11-13 DIAGNOSIS — M5416 Radiculopathy, lumbar region: Secondary | ICD-10-CM

## 2021-11-13 MED ORDER — METHYLPREDNISOLONE ACETATE 80 MG/ML IJ SUSP
80.0000 mg | Freq: Once | INTRAMUSCULAR | Status: AC
  Administered 2021-11-13: 80 mg

## 2021-11-13 NOTE — Progress Notes (Signed)
Pt state lower back pain that travels to his left thigh and ankle. Pt state walking and standing makes the pain worse. Pt state he takes pain meds to help ease his pain. ? ?Numeric Pain Rating Scale and Functional Assessment ?Average Pain 8 ? ? ?In the last MONTH (on 0-10 scale) has pain interfered with the following? ? ?1. General activity like being  able to carry out your everyday physical activities such as walking, climbing stairs, carrying groceries, or moving a chair?  ?Rating(10) ? ? ?+Driver, -BT, -Dye Allergies. ? ?

## 2021-11-13 NOTE — Patient Instructions (Signed)

## 2021-11-20 ENCOUNTER — Ambulatory Visit (INDEPENDENT_AMBULATORY_CARE_PROVIDER_SITE_OTHER): Payer: Self-pay | Admitting: Surgery

## 2021-11-20 ENCOUNTER — Other Ambulatory Visit (HOSPITAL_COMMUNITY): Payer: Self-pay

## 2021-11-20 ENCOUNTER — Encounter: Payer: Self-pay | Admitting: Surgery

## 2021-11-20 ENCOUNTER — Other Ambulatory Visit: Payer: Self-pay

## 2021-11-20 VITALS — BP 132/79 | HR 63 | Ht 72.0 in | Wt 187.4 lb

## 2021-11-20 DIAGNOSIS — Z9889 Other specified postprocedural states: Secondary | ICD-10-CM

## 2021-11-20 DIAGNOSIS — M5416 Radiculopathy, lumbar region: Secondary | ICD-10-CM

## 2021-11-20 NOTE — Progress Notes (Signed)
? ?Office Visit Note ?  ?Patient: Isaac Fletcher           ?Date of Birth: 16-May-1981           ?MRN: [id number] ?Visit Date: 11/20/2021 ?             ?Requested by: Frederica Kuster, MD ?[address] ?[place],  [place] [address] ?PCP: Frederica Kuster, MD ? ? ?Assessment & Plan: ?Visit Diagnoses:  ?1. Radiculopathy, lumbar region   ?2. S/P lumbar microdiscectomy   ? ? ?Plan: With patient's ongoing and worsening symptoms that have failed conservative treatment I will go ahead and repeat lumbar MRI scan with and without contrast and this can be compared to the study that was done October 2022.  He does have trace left anterior tib and gastroc weakness on exam.  Follow-up with me in 3 weeks for recheck discussed results and I will get Dr. Ophelia Charter and Dr. Otelia Sergeant to look at the study. ? ?Follow-Up Instructions: Return in about 3 weeks (around 12/11/2021) for WITH Kalle Bernath FOR REVIEW LUMBAR MRI.  ? ?Orders:  ?Orders Placed This Encounter  ?Procedures  ? MR Lumbar Spine W Wo Contrast  ? ?No orders of the defined types were placed in this encounter. ? ? ? ? Procedures: ?No procedures performed ? ? ?Clinical Data: ?No additional findings. ? ? ?Subjective: ?Chief Complaint  ?Patient presents with  ? Lower Back - Follow-up  ? ? ?HPI ?41 year old male returns for recheck of his low back pain and left lower extremity radiculopathy.  Again he is status post left [name] microdiscectomy with Dr. Ophelia Charter March 21, 2021.  Last office visit I performed left hip greater trochanter bursa Marcaine/Depo-Medrol injection and this continues to do very well.  He does continue to have ongoing pain in his low back that radiates into the left buttock and down to his left calf and foot.  No symptoms on the right side.  He also complains of weakness in the left lower extremity.  He did have lumbar ESI Dr. Alvester Morin November 13, 2021 and he states that this helped for a few days.  Patient does not come in with an interpreter today. ?Review of Systems ?No current  cardiopulmonary GI/GU issues ? ?Objective: ?Vital Signs: BP 132/79   Pulse 63   Ht 6' (1.829 m)   Wt 187 lb 6.2 oz (85 kg)   BMI 25.41 kg/m?  ? ?Physical Exam ?HENT:  ?   Head: Normocephalic.  ?Eyes:  ?   Extraocular Movements: Extraocular movements intact.  ?Pulmonary:  ?   Effort: No respiratory distress.  ?Musculoskeletal:  ?   Comments: Gait is antalgic due to left low back and buttock pain.  Positive left lumbar paraspinal tenderness.  Positive left-sided notch tenderness.  Negative on the right side.  Nontender over the greater trochanter bursa.  Negative logroll bilateral hips.  Positive left straight leg raise.  Negative on the right.  Trace left anterior tib and gastroc weakness.  ?Neurological:  ?   Mental Status: He is alert.  ?Psychiatric:     ?   Mood and Affect: Mood normal.  ? ? ?Ortho Exam ? ?Specialty Comments:  ?No specialty comments available. ? ?Imaging: ?No results found. ? ? ?PMFS History: ?Patient Active Problem List  ? Diagnosis Date Noted  ? Status post lumbar microdiscectomy 04/08/2021  ? Surgery, elective   ? Lumbar foraminal stenosis 02/19/2021  ? Trochanteric bursitis, left hip 02/19/2021  ? ?Past Medical History:  ?  Diagnosis Date  ? Back pain   ? Constipation   ? H/O hernia repair   ?  ?Family History  ?Problem Relation Age of Onset  ? Diabetes Father   ?  ?Past Surgical History:  ?Procedure Laterality Date  ? HERNIA REPAIR    ? Herniated Disc    ? LUMBAR LAMINECTOMY N/A 03/21/2021  ? Procedure: LEFT LEFT FOUR-FIVE MICRODISCECTOMY;  Surgeon: [name], MD;  Location: [facility] OR;  Service: Orthopedics;  Laterality: N/A;  ? ?Social History  ? ?Occupational History  ? Not on file  ?Tobacco Use  ? Smoking status: Never  ? Smokeless tobacco: Never  ?Vaping Use  ? Vaping Use: Never used  ?Substance and Sexual Activity  ? Alcohol use: Yes  ? Drug use: Never  ? Sexual activity: Not on file  ? ? ? ? ? ? ?

## 2021-11-22 NOTE — Progress Notes (Signed)
? ?[name] - 41 y.o. male MRN [id number]  Date of birth: [birth year] ? ?Office Visit Note: ?Visit Date: 11/13/2021 ?PCP: Isaac Kuster, MD ?Referred by: Isaac Kuster, MD ? ?Subjective: ?Chief Complaint  ?Patient presents with  ? Lower Back - Pain  ? Left Thigh - Pain  ? ?HPI:  Isaac Fletcher is a 41 y.o. male who comes in today at the request of Isaac Kief, [name] for planned Left [name] Lumbar Transforaminal epidural steroid injection with fluoroscopic guidance.  The patient has failed conservative care including home exercise, medications, time and activity modification.  This injection will be diagnostic and hopefully therapeutic.  Please see requesting physician notes for further details and justification. MRI reviewed with images and spine model.  MRI reviewed in the note below. ? ? ?ROS Otherwise per HPI. ? ?Assessment & Plan: ?Visit Diagnoses:  ?  ICD-10-CM   ?1. Lumbar radiculopathy  M54.16 XR C-ARM NO REPORT  ?  Epidural Steroid injection  ?  methylPREDNISolone acetate (DEPO-MEDROL) injection 80 mg  ?  ?  ?Plan: No additional findings.  ? ?Meds & Orders:  ?Meds ordered this encounter  ?Medications  ? methylPREDNISolone acetate (DEPO-MEDROL) injection 80 mg  ?  ?Orders Placed This Encounter  ?Procedures  ? XR C-ARM NO REPORT  ? Epidural Steroid injection  ?  ?Follow-up: Return for visit to requesting provider as needed.  ? ?Procedures: ?No procedures performed  ?   ? ?Clinical History: ?MRI LUMBAR SPINE WITHOUT AND WITH CONTRAST  ?   ?TECHNIQUE:  ?Multiplanar and multiecho pulse sequences of the lumbar spine were  ?obtained without and with intravenous contrast.  ?   ?CONTRAST:  53mL MULTIHANCE GADOBENATE DIMEGLUMINE 529 MG/ML IV SOLN  ?   ?COMPARISON:  MRI of the lumbar spine November 14, 2020.  ?   ?FINDINGS:  ?Segmentation: Standard segmentation is seen. The inferior-most fully  ?formed intervertebral disc is labeled L5-S1. Same numbering as on  ?the prior MRI.  ?   ?Alignment: Straightening of  the normal lumbar lordosis. No  ?substantial sagittal subluxation.  ?   ?Vertebrae: Congenitally short pedicles. Vertebral body heights are  ?maintained. No focal marrow edema to suggest acute fracture or  ?discitis/osteomyelitis.  ?   ?Conus medullaris and cauda equina: Conus extends to the L2 level.  ?Conus and cauda equina appear normal. No abnormal enhancement of the  ?conus or cauda equina.  ?   ?Paraspinal and other soft tissues: Postoperative changes in the  ?lower left lumbar spine. Otherwise, unremarkable.  ?   ?Disc levels:  ?   ?T12-L1: No significant disc protrusion, foraminal stenosis, or canal  ?stenosis.  ?   ?L1-L2: No significant disc protrusion, foraminal stenosis, or canal  ?stenosis.  ?   ?L2-L3: No significant disc protrusion, foraminal stenosis, or canal  ?stenosis.  ?   ?L3-L4: No significant disc protrusion, foraminal stenosis, or canal  ?stenosis.  ?   ?L4-L5: Disc height loss and desiccation. Broad disc bulge. Enhancing  ?scar tissue in the posterolateral left canal and left foramen,  ?compatible with sequela interval micro discectomy. This scar tissue  ?limits assessment; however, left foraminal stenosis is likely  ?improved without evidence of recurrent disc herniation. No  ?significant canal stenosis. Similar moderate right foraminal  ?stenosis.  ?   ?L5-S1: Mild disc bulging with moderate right and mild left foraminal  ?stenosis. No significant canal stenosis.  ?   ?IMPRESSION:  ?1. Interval left L4-L5 micro discectomy. Scar tissue in the  left  ?foramen and lateral canal limits assessment, but suspect improved  ?foraminal stenosis without evidence of recurrent herniation. Similar  ?moderate right foraminal stenosis at this level.  ?2. At L5-S1, similar moderate right and mild left foraminal  ?stenosis.  ?   ?   ?Electronically Signed  ?  By: Isaac Fletcher M.D.  ?  On: 07/07/2021 12:07  ? ? ? ?Objective:  VS:  HT:    WT:   BMI:     BP:(!) 152/96  HR:71bpm  TEMP: ( )  RESP:   ?Physical Exam  ? ?Imaging: ?No results found. ?

## 2021-11-25 ENCOUNTER — Other Ambulatory Visit (HOSPITAL_COMMUNITY): Payer: Self-pay

## 2021-11-25 ENCOUNTER — Ambulatory Visit: Payer: Self-pay | Admitting: Family Medicine

## 2021-11-25 ENCOUNTER — Other Ambulatory Visit: Payer: Self-pay | Admitting: Family Medicine

## 2021-11-25 DIAGNOSIS — M48061 Spinal stenosis, lumbar region without neurogenic claudication: Secondary | ICD-10-CM

## 2021-11-26 ENCOUNTER — Other Ambulatory Visit (HOSPITAL_COMMUNITY): Payer: Self-pay

## 2021-11-26 MED ORDER — METHOCARBAMOL 500 MG PO TABS
500.0000 mg | ORAL_TABLET | Freq: Three times a day (TID) | ORAL | 3 refills | Status: DC | PRN
  Filled 2021-11-26: qty 30, 10d supply, fill #0

## 2021-12-03 ENCOUNTER — Telehealth: Payer: Self-pay

## 2021-12-03 NOTE — Telephone Encounter (Signed)
Just called pt and informed him that [facility] haS the order and they will be the ones to call but gave him the number to call also.  ?

## 2021-12-03 NOTE — Telephone Encounter (Signed)
Patient came into the office stating that he has yet to hear anything regarding a MRI.Marland Kitchen  ? ?Please advise on what he should do next  ?

## 2021-12-05 ENCOUNTER — Other Ambulatory Visit: Payer: Self-pay

## 2021-12-10 ENCOUNTER — Telehealth: Payer: Self-pay | Admitting: Surgery

## 2021-12-10 NOTE — Telephone Encounter (Signed)
Called pt 1X and need to reschedule with PA Jeneen Rinks after 4/11 ?

## 2021-12-11 ENCOUNTER — Ambulatory Visit: Payer: Self-pay | Admitting: Surgery

## 2021-12-16 ENCOUNTER — Ambulatory Visit
Admission: RE | Admit: 2021-12-16 | Discharge: 2021-12-16 | Disposition: A | Discharge status: Home / Self Care | Payer: Self-pay | Source: Ambulatory Visit | Attending: Surgery | Admitting: Surgery

## 2021-12-16 DIAGNOSIS — Z9889 Other specified postprocedural states: Secondary | ICD-10-CM

## 2021-12-16 DIAGNOSIS — M5416 Radiculopathy, lumbar region: Secondary | ICD-10-CM

## 2021-12-16 IMAGING — MR MR LUMBAR SPINE WO/W CM
4 of 7 series · 24 of 48 positions shown · IV contrast (19 ml multihance)
Comparison: MRI lumbar spine [DATE].

CLINICAL DATA: Low back pain, symptoms persist with > 6 wks
treatment Lumbar radiculopathy, symptoms persist with > 6 wks
treatment Low back pain, prior surgery, new symptoms

EXAM:
MRI LUMBAR SPINE WITHOUT AND WITH CONTRAST
TECHNIQUE: Multiplanar and multiecho pulse sequences of the lumbar spine were
obtained without and with intravenous contrast.
CONTRAST:  19mL MULTIHANCE GADOBENATE DIMEGLUMINE 529 MG/ML IV SOLN

[Series 3: T1 · sagittal · 4.0mm · 0.94mm/px · 3 of 14 slices shown (1 of 2)]
[im 1/14]
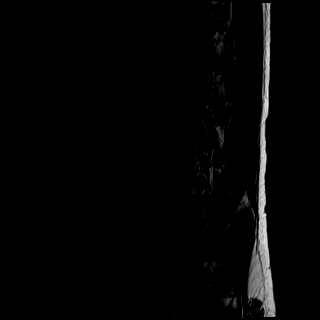
[im 7/14]
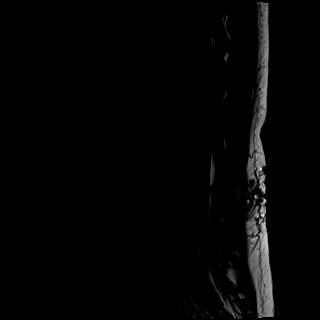
[im 14/14]
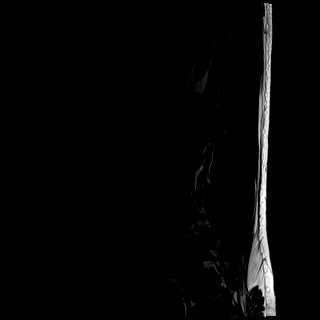

[Series 5: T1 · axial · 4.0mm · 0.39mm/px · z∈[-112,+66]mm · 6 of 39 slices shown (2 of 2)]
[im 1/39]
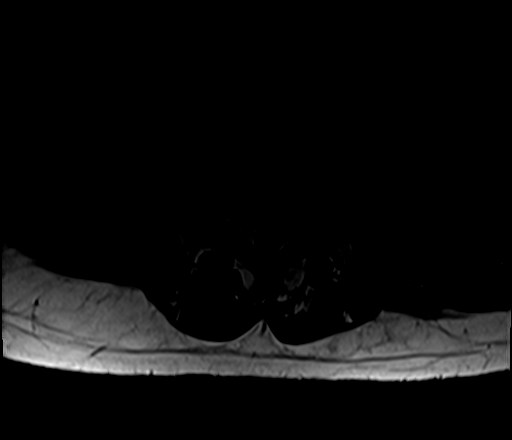
[im 4/39]
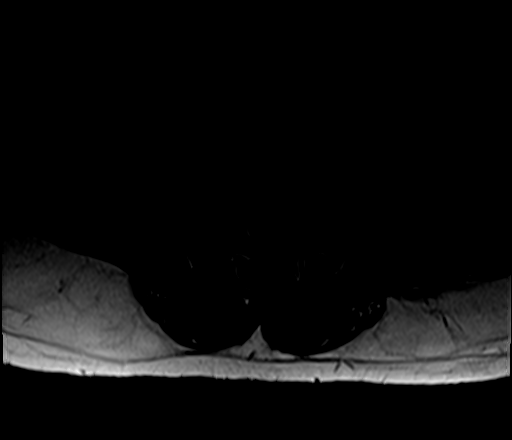
[im 8/39]
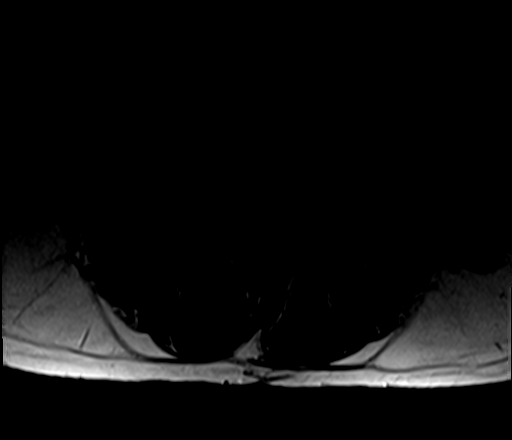
[im 12/39]
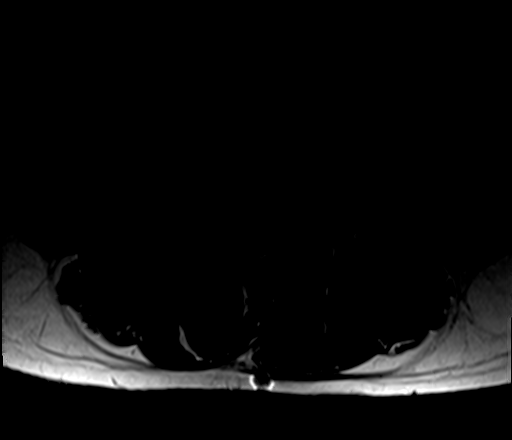
[im 20/39]
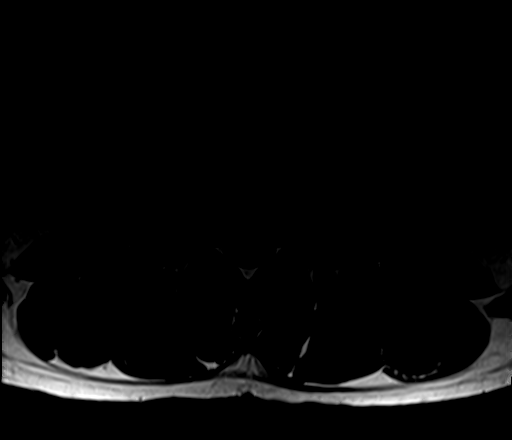
[im 35/39]
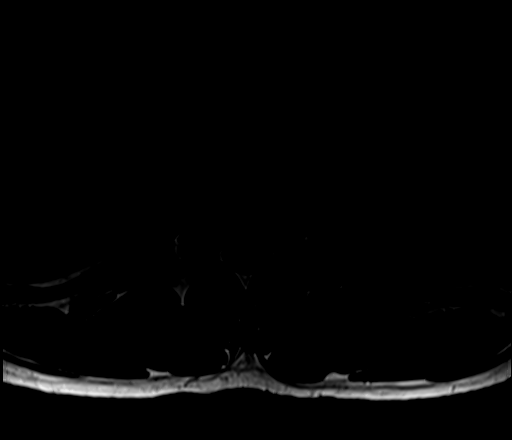

[Series 6: T2 · axial · 4.0mm · 0.39mm/px · z∈[-114,+86]mm · 11 of 39 slices shown (1 of 2)]
[im 1/39]
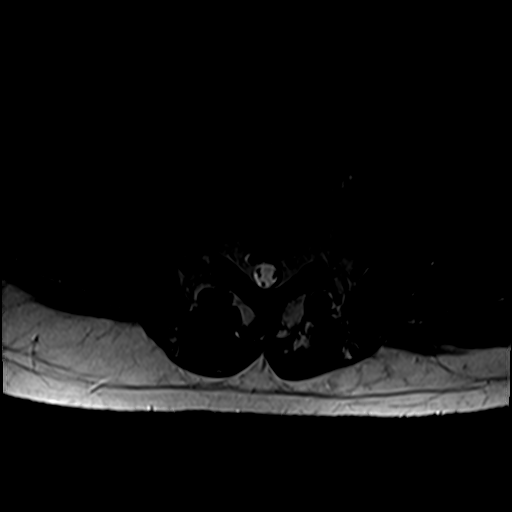
[im 4/39]
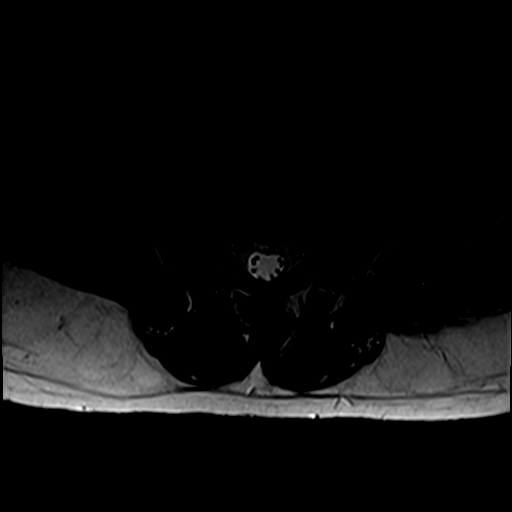
[im 8/39]
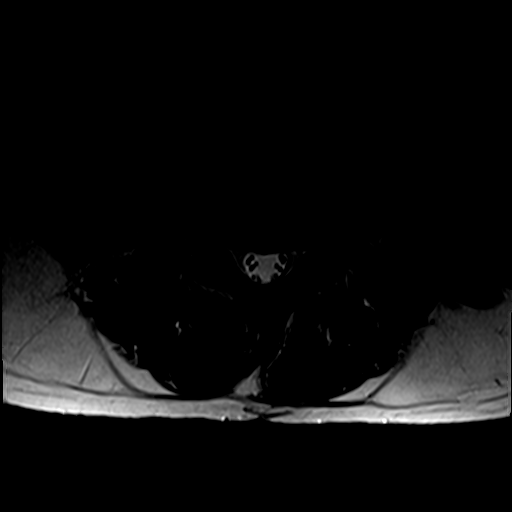
[im 12/39]
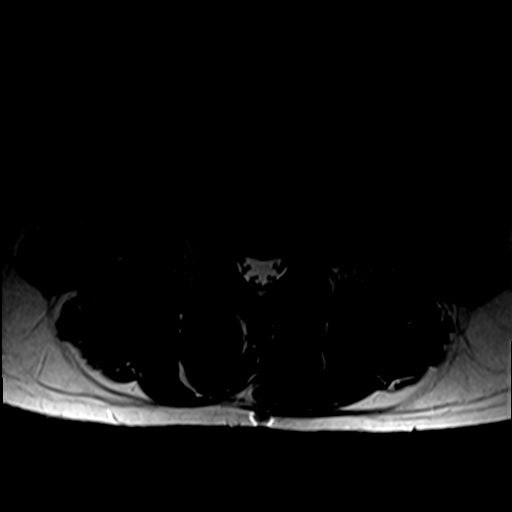
[im 16/39]
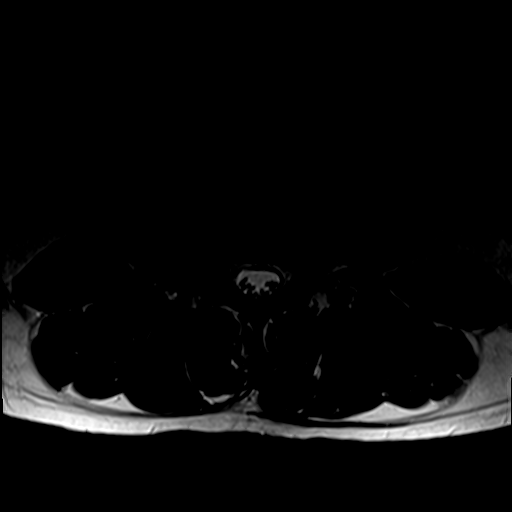
[im 20/39]
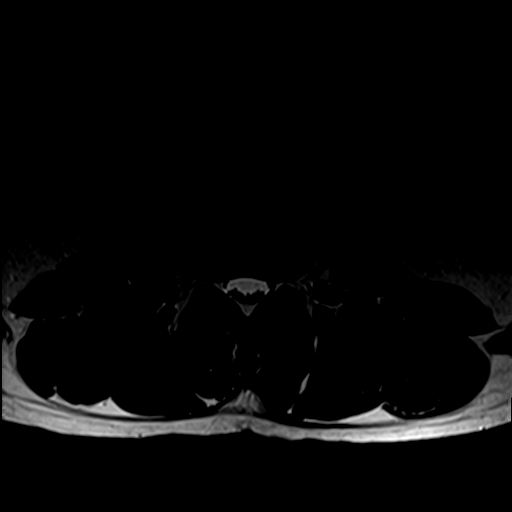
[im 23/39]
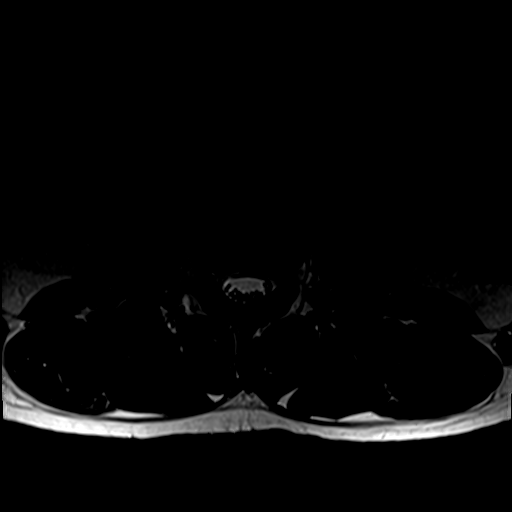
[im 27/39]
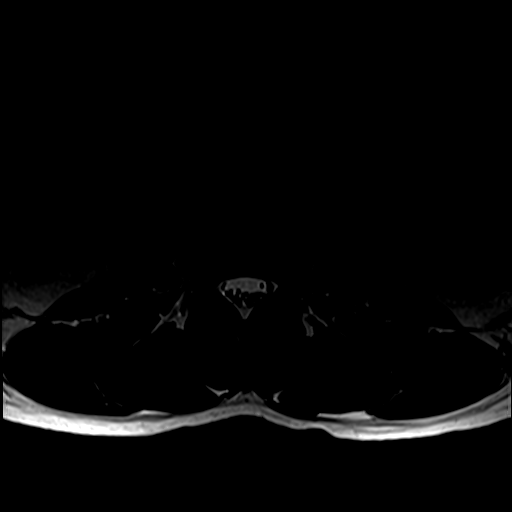
[im 31/39]
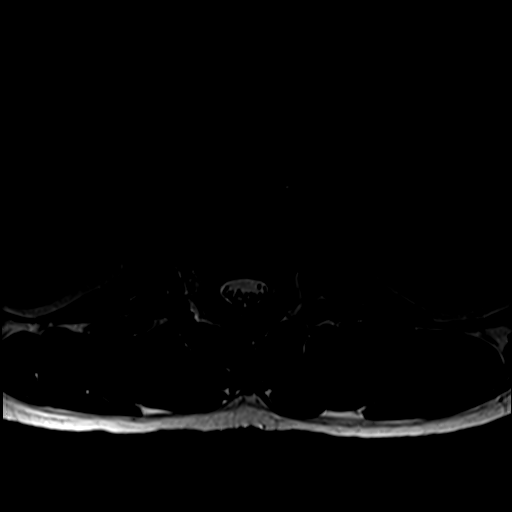
[im 35/39]
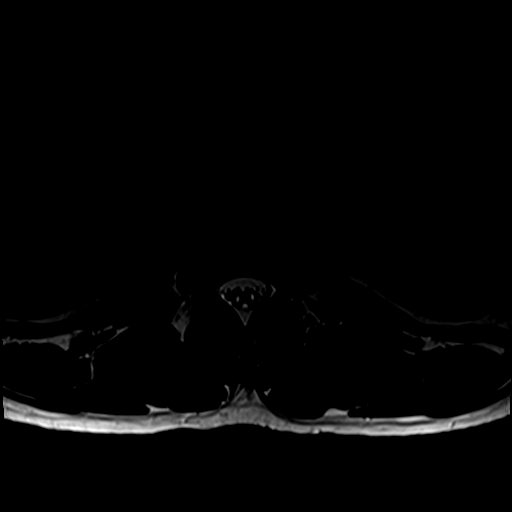
[im 39/39]
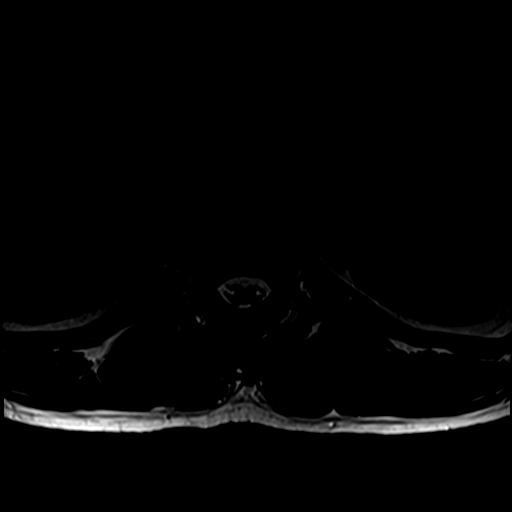

[Series 7: T2 · sagittal · 4.0mm · 1.17mm/px · 4 of 14 slices shown (2 of 2)]
[im 1/14]
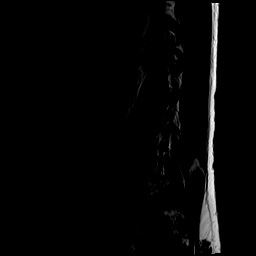
[im 5/14]
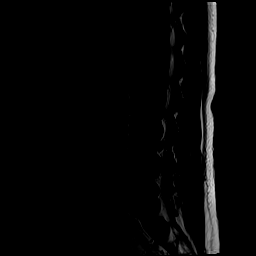
[im 9/14]
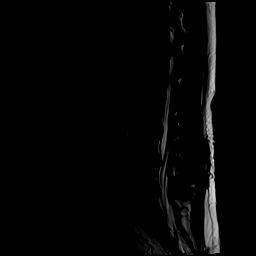
[im 14/14]
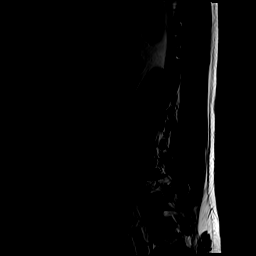

[24 of 48 positions shown; findings below may reference images not displayed]

FINDINGS: Segmentation:  Standard segmentation is assumed.

Alignment:  No substantial sagittal subluxation.  Straightening.

Vertebrae: Vertebral body heights are maintained. No focal marrow
edema chest acute fracture discitis/osteomyelitis. No suspicious
bone lesions. Congenitally short pedicles. No abnormal enhancement.

Conus medullaris and cauda equina: Conus extends to the L2 level.
Conus appears normal. No abnormal enhancement of the conus or cauda
equina.

Paraspinal and other soft tissues: Negative.

Disc levels:

T12-L1: No significant disc protrusion, foraminal stenosis, or canal
stenosis.

L1-L2: No significant disc protrusion, foraminal stenosis, or canal
stenosis.

L2-L3: No significant disc protrusion, foraminal stenosis, or canal
stenosis.

L3-L4: No significant disc protrusion, foraminal stenosis, or canal
stenosis.

L4-L5: Prior left micro discectomy. Similar enhancing scar tissue in
left foramen with suspected small residual disc protrusion. There is
also broad disc bulging at this level. Far lateral/extraforaminal
left-sided disc disc closely approximates the exiting/exited left L4
nerve. Similar moderate bilateral foraminal stenosis.

L5-S1: Mild disc bulging with moderate right and mild to moderate
left foraminal stenosis, similar. No significant canal stenosis.
IMPRESSION: 1. At L4-L5, prior left micro discectomy with similar scar tissue
and probable small disc protrusion. Similar moderate bilateral
foraminal stenosis at this level. Far lateral/extraforaminal
left-sided disc closely approximates the exiting/exited left L4
nerve. Left-sided foraminal stenosis is improved relative to more
remote [DATE] prior.
2. At L5-S1, similar moderate right and mild to moderate left
foraminal stenosis.

## 2021-12-16 MED ORDER — GADOBENATE DIMEGLUMINE 529 MG/ML IV SOLN
19.0000 mL | Freq: Once | INTRAVENOUS | Status: AC | PRN
  Administered 2021-12-16: 19 mL via INTRAVENOUS

## 2021-12-24 ENCOUNTER — Encounter: Payer: Self-pay | Admitting: Surgery

## 2021-12-24 ENCOUNTER — Ambulatory Visit (INDEPENDENT_AMBULATORY_CARE_PROVIDER_SITE_OTHER): Payer: Self-pay | Admitting: Surgery

## 2021-12-24 DIAGNOSIS — M5416 Radiculopathy, lumbar region: Secondary | ICD-10-CM

## 2022-01-07 ENCOUNTER — Ambulatory Visit: Payer: Self-pay | Admitting: Surgery

## 2022-01-16 ENCOUNTER — Ambulatory Visit (INDEPENDENT_AMBULATORY_CARE_PROVIDER_SITE_OTHER): Payer: Self-pay | Admitting: Specialist

## 2022-01-16 ENCOUNTER — Other Ambulatory Visit (HOSPITAL_COMMUNITY): Payer: Self-pay

## 2022-01-16 ENCOUNTER — Encounter: Payer: Self-pay | Admitting: Specialist

## 2022-01-16 ENCOUNTER — Ambulatory Visit: Payer: Self-pay

## 2022-01-16 VITALS — BP 136/92 | HR 73 | Ht 72.0 in | Wt 183.0 lb

## 2022-01-16 DIAGNOSIS — M48061 Spinal stenosis, lumbar region without neurogenic claudication: Secondary | ICD-10-CM

## 2022-01-16 DIAGNOSIS — M5416 Radiculopathy, lumbar region: Secondary | ICD-10-CM

## 2022-01-16 DIAGNOSIS — M5126 Other intervertebral disc displacement, lumbar region: Secondary | ICD-10-CM

## 2022-01-16 MED ORDER — DICLOFENAC SODIUM 50 MG PO TBEC
50.0000 mg | DELAYED_RELEASE_TABLET | Freq: Two times a day (BID) | ORAL | 1 refills | Status: DC
  Filled 2022-01-16: qty 60, 30d supply, fill #0

## 2022-01-16 MED ORDER — HYDROCODONE-ACETAMINOPHEN 7.5-325 MG PO TABS
1.0000 | ORAL_TABLET | Freq: Four times a day (QID) | ORAL | 0 refills | Status: DC | PRN
  Filled 2022-01-16: qty 30, 8d supply, fill #0

## 2022-01-16 NOTE — Congregational Nurse Program (Signed)
?  Dept: [phone] ? ? ?Congregational Nurse Program Note ? ?Date of Encounter: 01/16/2022 ? ?Past Medical History: ?Past Medical History:  ?Diagnosis Date  ? Back pain   ? Constipation   ? H/O hernia repair   ? ? ?Encounter Details: ? CNP Questionnaire - 01/16/22 1050   ? ?  ? Questionnaire  ? Do you give verbal consent to treat you today? Yes   ? Location Patient Served  [facility]   ? Visit Setting Phone/Text/Email   ? Patient Status Immigrant   ? Insurance Uninsured ([employer] Connects/Self-Pay)   ? Insurance Referral Charitable Care   ? Medication Have Medication Insecurities   ? Medical Provider Yes   ? Screening Referrals Annual Wellness Visit;N/A   ? Medical Referral N/A   ? Medical Appointment Made N/A   ? Food N/A   ? Transportation N/A   ? Housing/Utilities N/A   ? Interpersonal Safety N/A   ? Intervention [facility]   ? ED Visit Averted N/A   ? Life-Saving Intervention Made N/A   ? ?  ?  ? ?  ? ?Patent has medication insecurities. Patient was referred to [facility] outpatient pharmacy.   ? ? ?  01/16/2022  ?  9:20 AM 11/20/2021  ?  9:48 AM 11/13/2021  ? 10:28 AM  ?Vitals with BMI  ?Height 6\' 0"  6\' 0"    ?Weight 183 lbs 187 lbs 6 oz   ?BMI 24.81 25.41   ?Systolic 136 132  ?Diastolic 92 79 96  ?Pulse 73 63 71  ?  ? ? ?

## 2022-01-16 NOTE — Patient Instructions (Signed)
Avoid bending, stooping and avoid lifting weights greater than 10 lbs. ?Avoid prolong standing and walking. ?Order for a new walker with wheels. ?Surgery scheduling secretary Tivis Ringer, will call you in the next week to schedule for surgery.  ?Surgery recommended is a one level left lumbar far lateral microdiscectomy at [name] this would be done with microscope.  ?Take hydrocodone for for pain. ?Risk of surgery includes risk of infection 1 in 300 patients, bleeding 1/100% chance you would need a transfusion.   Risk to the nerves is one in 10,000. ?Expect improved walking and standing tolerance. Expect relief of leg pain but numbness ?may persist depending on the length and degree of pressure that has been present. ?  ?

## 2022-01-16 NOTE — Progress Notes (Signed)
Office Visit Note   Patient: Isaac Fletcher           Date of Birth: 1981/02/15           MRN: [id number] Visit Date: 01/16/2022              Requested by: Wardell Honour, [address],  [place] [address] PCP: Wardell Honour, MD   Assessment & Plan: Visit Diagnoses:  1. Radiculopathy, lumbar region   2. Lumbar disc herniation   3. Lumbar foraminal stenosis     Plan: Avoid bending, stooping and avoid lifting weights greater than 10 lbs. Avoid prolong standing and walking. Order for a new walker with wheels. Surgery scheduling secretary Kandice Hams, will call you in the next week to schedule for surgery.  Surgery recommended is a one level left lumbar far lateral microdiscectomy at [name] this would be done with microscope.  Take hydrocodone for for pain. Risk of surgery includes risk of infection 1 in 300 patients, bleeding 1/100% chance you would need a transfusion.   Risk to the nerves is one in 10,000. Expect improved walking and standing tolerance. Expect relief of leg pain but numbness may persist depending on the length and degree of pressure that has been present.  Follow-Up Instructions: Return in about 2 weeks (around 01/30/2022).   Orders:  Orders Placed This Encounter  Procedures   XR Lumbar Spine 2-3 Views   No orders of the defined types were placed in this encounter.     Procedures: No procedures performed   Clinical Data: No additional findings.   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 There was pain and numbness in to the left anterior leg. There is weakness and he stopped playing due to left leg pain. Pain is worse with standing and walking and feels better sitting. The left leg cramps up on him with walking. Weakness with  stairs.and able.  He sleeps at night on the right side with the left leg flexed at the hip and knee to decrease pain. Pain in the left thigh and left knee. No bowel and bladder.  Review of Systems  Constitutional: Negative.   HENT: Negative.    Eyes: Negative.   Respiratory: Negative.    Cardiovascular: Negative.   Gastrointestinal: Negative.   Endocrine: Negative.   Genitourinary: Negative.   Musculoskeletal: Negative.   Skin: Negative.   Allergic/Immunologic: Negative.   Neurological: Negative.   Hematological: Negative.   Psychiatric/Behavioral: Negative.      Objective: Vital Signs: BP (!) 136/92 (BP Location: Left Arm, Patient Position: Sitting)   Pulse 73   Ht 6' (1.829 m)   Wt 183 lb (83 kg)   BMI 24.82 kg/m   Physical Exam Constitutional:      Appearance: He is well-developed.  HENT:     Head: Normocephalic and atraumatic.  Eyes:     Pupils: Pupils are equal, round, and reactive to light.  Pulmonary:     Effort: Pulmonary effort is normal.     Breath sounds: Normal breath sounds.  Abdominal:     General: Bowel sounds are normal.     Palpations: Abdomen is soft.  Musculoskeletal:     Cervical back: Normal range of motion and neck  supple.     Lumbar back: Negative right straight leg raise test and negative left straight leg raise test.  Skin:    General: Skin is warm and dry.  Neurological:     Mental Status: He is alert and oriented to person, place, and time.  Psychiatric:        Behavior: Behavior normal.        Thought Content: Thought content normal.        Judgment: Judgment normal.   Back Exam   Tenderness  The patient is experiencing tenderness in the lumbar.  Range of Motion  Extension:  abnormal  Flexion:  normal  Lateral bend right:  normal  Lateral bend left:  abnormal  Rotation right:  normal  Rotation left:  abnormal   Tests  Straight leg raise right: negative Straight leg raise left: negative  Other  Toe walk: normal Heel  walk: normal Sensation: decreased Gait: abnormal  Erythema: no back redness Scars: absent    Specialty Comments:  MRI LUMBAR SPINE WITHOUT AND WITH CONTRAST     TECHNIQUE:  Multiplanar and multiecho pulse sequences of the lumbar spine were  obtained without and with intravenous contrast.     CONTRAST:  42mL MULTIHANCE GADOBENATE DIMEGLUMINE 529 MG/ML IV SOLN     COMPARISON:  MRI of the lumbar spine November 14, 2020.     FINDINGS:  Segmentation: Standard segmentation is seen. The inferior-most fully  formed intervertebral disc is labeled L5-S1. Same numbering as on  the prior MRI.     Alignment: Straightening of the normal lumbar lordosis. No  substantial sagittal subluxation.     Vertebrae: Congenitally short pedicles. Vertebral body heights are  maintained. No focal marrow edema to suggest acute fracture or  discitis/osteomyelitis.     Conus medullaris and cauda equina: Conus extends to the L2 level.  Conus and cauda equina appear normal. No abnormal enhancement of the  conus or cauda equina.     Paraspinal and other soft tissues: Postoperative changes in the  lower left lumbar spine. Otherwise, unremarkable.     Disc levels:     T12-L1: No significant disc protrusion, foraminal stenosis, or canal  stenosis.     L1-L2: No significant disc protrusion, foraminal stenosis, or canal  stenosis.     L2-L3: No significant disc protrusion, foraminal stenosis, or canal  stenosis.     L3-L4: No significant disc protrusion, foraminal stenosis, or canal  stenosis.     L4-L5: Disc height loss and desiccation. Broad disc bulge. Enhancing  scar tissue in the posterolateral left canal and left foramen,  compatible with sequela interval micro discectomy. This scar tissue  limits assessment; however, left foraminal stenosis is likely  improved without evidence of recurrent disc herniation. No  significant canal stenosis. Similar moderate right foraminal  stenosis.     L5-S1:  Mild disc bulging with moderate right and mild left foraminal  stenosis. No significant canal stenosis.     IMPRESSION:  1. Interval left L4-L5 micro discectomy. Scar tissue in the left  foramen and lateral canal limits assessment, but suspect improved  foraminal stenosis without evidence of recurrent herniation. Similar  moderate right foraminal stenosis at this level.  2. At L5-S1, similar moderate right and mild left foraminal  stenosis.        Electronically Signed    By: Feliberto Harts M.D.    On: 07/07/2021 12:07  Imaging: No results found.   PMFS History: Patient Active Problem List  Diagnosis Date Noted   Status post lumbar microdiscectomy 04/08/2021   Surgery, elective    Lumbar foraminal stenosis 02/19/2021   Trochanteric bursitis, left hip 02/19/2021   Past Medical History:  Diagnosis Date   Back pain    Constipation    H/O hernia repair     Family History  Problem Relation Age of Onset   Diabetes Father     Past Surgical History:  Procedure Laterality Date   HERNIA REPAIR     Herniated Disc     LUMBAR LAMINECTOMY N/A 03/21/2021   Procedure: LEFT LEFT FOUR-FIVE MICRODISCECTOMY;  Surgeon: Marybelle Killings, MD;  Location: [facility];  Service: Orthopedics;  Laterality: N/A;   Social History   Occupational History   Not on file  Tobacco Use   Smoking status: Never   Smokeless tobacco: Never  Vaping Use   Vaping Use: Never used  Substance and Sexual Activity   Alcohol use: Yes   Drug use: Never   Sexual activity: Not on file

## 2022-01-19 NOTE — Telephone Encounter (Signed)
error 

## 2022-01-30 ENCOUNTER — Telehealth: Payer: Self-pay

## 2022-01-30 NOTE — Telephone Encounter (Signed)
I was able to call patient first and then conference [employer] in on the call to schedule surgery.

## 2022-01-30 NOTE — Telephone Encounter (Signed)
I called patient on 01/27/22 using [employer].  We called patient two times and were unable to reach patient or leave message.  I called patient today and he answered however due to language barrier we could not discuss scheduling surgery.  I contacted him again through [facility] and once again were unable to speak to patient or leave message.    I will have to try again and/or investigate another method of reaching patient.

## 2022-02-05 ENCOUNTER — Ambulatory Visit (INDEPENDENT_AMBULATORY_CARE_PROVIDER_SITE_OTHER): Payer: Self-pay | Admitting: Surgery

## 2022-02-05 ENCOUNTER — Encounter: Payer: Self-pay | Admitting: Surgery

## 2022-02-05 VITALS — BP 130/89 | HR 50 | Temp 98.8°F

## 2022-02-05 DIAGNOSIS — M5416 Radiculopathy, lumbar region: Secondary | ICD-10-CM

## 2022-02-05 DIAGNOSIS — M5126 Other intervertebral disc displacement, lumbar region: Secondary | ICD-10-CM

## 2022-02-05 NOTE — Progress Notes (Signed)
Office Visit Note   Patient: Isaac Fletcher           Date of Birth: 03-17-1981           MRN: [id number] Visit Date: 02/05/2022              Requested by: Wardell Honour, [address],  [place] [id number] PCP: Wardell Honour, MD   Assessment & Plan: Visit Diagnoses:  1. Radiculopathy, lumbar region   2. 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  Surgical procedure discussed along potential hospital stay.  All questions answered with the help of interpreter.     Follow-Up Instructions: Return for Return office visit 1 week postop.   Orders:  No orders of the defined types were placed in this encounter.  No orders of the defined types were placed in this encounter.     Procedures: No procedures performed   Clinical Data: No additional findings.   Subjective: Chief Complaint  Patient presents with   Lower Back - Pain    HPI 41 year old male with history of left [name] far lateral HNP comes in for preop evaluation.  States that symptoms unchanged from previous visit.  He is wanting to proceed with LEFT FAR LATERAL APPROACH TO REMOVE LEFT [name] DISC HERNIATION scheduled.  Today history physical performed.  Operative present. Review of Systems  Constitutional:  Positive for activity change.  HENT: Negative.    Respiratory: Negative.    Cardiovascular: Negative.   Gastrointestinal:  Positive for constipation.  Musculoskeletal:  Positive for back pain and gait problem.  Neurological:  Positive for weakness and numbness.  Psychiatric/Behavioral: Negative.    No current cardiopulmonary GI/GU issues.  Denies fever chills.  Objective: Vital Signs: BP 130/89   Pulse (!) 50   Temp 98.8 F (37.1 C)   SpO2 98%   Physical Exam Constitutional:      Appearance: Normal appearance.  HENT:     Head: Normocephalic.     Nose: Nose normal.   Eyes:     Extraocular Movements: Extraocular movements intact.  Cardiovascular:     Rate and Rhythm: Regular rhythm.     Heart sounds: Murmur heard.  Pulmonary:     Effort: Pulmonary effort is normal. No respiratory distress.     Breath sounds: Normal breath sounds.  Neurological:     Mental Status: He is alert.    Ortho Exam  Specialty Comments:  MRI LUMBAR SPINE WITHOUT AND WITH CONTRAST     TECHNIQUE:  Multiplanar and multiecho pulse sequences of the lumbar spine were  obtained without and with intravenous contrast.     CONTRAST:  18mL MULTIHANCE GADOBENATE DIMEGLUMINE 529 MG/ML IV SOLN     COMPARISON:  MRI of the lumbar spine November 14, 2020.     FINDINGS:  Segmentation: Standard segmentation is seen. The inferior-most fully  formed intervertebral disc is labeled L5-S1. Same numbering as on  the prior MRI.     Alignment: Straightening of the normal lumbar lordosis. No  substantial sagittal subluxation.     Vertebrae: Congenitally short pedicles. Vertebral body heights are  maintained. No focal marrow edema to suggest acute fracture or  discitis/osteomyelitis.     Conus medullaris and cauda equina: Conus extends to the L2 level.  Conus and cauda equina appear normal. No abnormal enhancement of the  conus  or cauda equina.     Paraspinal and other soft tissues: Postoperative changes in the  lower left lumbar spine. Otherwise, unremarkable.     Disc levels:     T12-L1: No significant disc protrusion, foraminal stenosis, or canal  stenosis.     L1-L2: No significant disc protrusion, foraminal stenosis, or canal  stenosis.     L2-L3: No significant disc protrusion, foraminal stenosis, or canal  stenosis.     L3-L4: No significant disc protrusion, foraminal stenosis, or canal  stenosis.     L4-L5: Disc height loss and desiccation. Broad disc bulge. Enhancing  scar tissue in the posterolateral left canal and left foramen,  compatible with sequela interval  micro discectomy. This scar tissue  limits assessment; however, left foraminal stenosis is likely  improved without evidence of recurrent disc herniation. No  significant canal stenosis. Similar moderate right foraminal  stenosis.     L5-S1: Mild disc bulging with moderate right and mild left foraminal  stenosis. No significant canal stenosis.     IMPRESSION:  1. Interval left L4-L5 micro discectomy. Scar tissue in the left  foramen and lateral canal limits assessment, but suspect improved  foraminal stenosis without evidence of recurrent herniation. Similar  moderate right foraminal stenosis at this level.  2. At L5-S1, similar moderate right and mild left foraminal  stenosis.        Electronically Signed    By: Margaretha Sheffield M.D.    On: 07/07/2021 12:07  Imaging: No results found.   PMFS History: Patient Active Problem List   Diagnosis Date Noted   Status post lumbar microdiscectomy 04/08/2021   Surgery, elective    Lumbar foraminal stenosis 02/19/2021   Trochanteric bursitis, left hip 02/19/2021   Past Medical History:  Diagnosis Date   Back pain    Constipation    H/O hernia repair     Family History  Problem Relation Age of Onset   Diabetes Father     Past Surgical History:  Procedure Laterality Date   HERNIA REPAIR     Herniated Disc     LUMBAR LAMINECTOMY N/A 03/21/2021   Procedure: LEFT LEFT FOUR-FIVE MICRODISCECTOMY;  Surgeon: Marybelle Killings, MD;  Location: [facility];  Service: Orthopedics;  Laterality: N/A;   Social History   Occupational History   Not on file  Tobacco Use   Smoking status: Never   Smokeless tobacco: Never  Vaping Use   Vaping Use: Never used  Substance and Sexual Activity   Alcohol use: Yes   Drug use: Never   Sexual activity: Not on file

## 2022-02-10 NOTE — Pre-Procedure Instructions (Signed)
Surgical Instructions    Your procedure is scheduled on Monday, 02/16/2022.  Report to [facility] Main Entrance "A" at 5:30 A.M., then check in with the Admitting office.  Call this number if you have problems the morning of surgery:  [phone]   If you have any questions prior to your surgery date call [phone]: Open Monday-Friday 8am-4pm    Remember:  Do not eat after midnight the night before your surgery  You may drink clear liquids until 4:30 AM the morning of your surgery.   Clear liquids allowed are: Water, Non-Citrus Juices (without pulp), Carbonated Beverages, Clear Tea, Black Coffee ONLY (NO MILK, CREAM OR POWDERED CREAMER of any kind), and Gatorade  Patient Instructions  The night before surgery:  No food after midnight. ONLY clear liquids after midnight  The day of surgery (if you do NOT have diabetes):  Drink ONE (1) Pre-Surgery Clear Ensure by 4:30 AM the morning of surgery. Drink in one sitting. Do not sip.  This drink was given to you during your hospital  pre-op appointment visit.  Nothing else to drink after completing the  Pre-Surgery Clear Ensure.     Take these medicines the morning of surgery with A SIP OF WATER:    NORCO - as needed   As of today, STOP taking any Aspirin (unless otherwise instructed by your surgeon) Aleve, Naproxen, Ibuprofen, Motrin, Advil, Goody's, BC's, all herbal medications, fish oil, and all vitamins.           Do not wear jewelry or makeup Do not wear lotions, powders, perfumes/colognes, or deodorant. Do not shave 48 hours prior to surgery.  Men may shave face and neck. Do not bring valuables to the hospital. Do not wear nail polish, gel polish, artificial nails, or any other type of covering on natural nails (fingers and toes) If you have artificial nails or gel coating that need to be removed by a nail salon, please have this removed prior to surgery. Artificial nails or gel coating may interfere with anesthesia's  ability to adequately monitor your vital signs.  [place] is not responsible for any belongings or valuables. .   Do NOT Smoke (Tobacco/Vaping)  24 hours prior to your procedure  If you use a CPAP at night, you may bring your mask for your overnight stay.   Contacts, glasses, hearing aids, dentures or partials may not be worn into surgery, please bring cases for these belongings   For patients admitted to the hospital, discharge time will be determined by your treatment team.   Patients discharged the day of surgery will not be allowed to drive home, and someone needs to stay with them for 24 hours.   SURGICAL WAITING ROOM VISITATION Patients having surgery or a procedure in a hospital may have two support people. Children under the age of 69 must have an adult with them who is not the patient. They may stay in the waiting area during the procedure and may switch out with other visitors. If the patient needs to stay at the hospital during part of their recovery, the visitor guidelines for inpatient rooms apply.  Please refer to the [facility] website for the visitor guidelines for Inpatients (after your surgery is over and you are in a regular room).       Special instructions:    Oral Hygiene is also important to reduce your risk of infection.  Remember - BRUSH YOUR TEETH THE MORNING OF SURGERY WITH YOUR REGULAR TOOTHPASTE   [facility]- Preparing For Surgery  Before surgery, you can play an important role. Because skin is not sterile, your skin needs to be as free of germs as possible. You can reduce the number of germs on your skin by washing with CHG (chlorahexidine gluconate) Soap before surgery.  CHG is an antiseptic cleaner which kills germs and bonds with the skin to continue killing germs even after washing.     Please do not use if you have an allergy to CHG or antibacterial soaps. If your skin becomes reddened/irritated stop using the CHG.  Do not shave (including  legs and underarms) for at least 48 hours prior to first CHG shower. It is OK to shave your face.  Please follow these instructions carefully.     Shower the NIGHT BEFORE SURGERY and the MORNING OF SURGERY with CHG Soap.   If you chose to wash your hair, wash your hair first as usual with your normal shampoo. After you shampoo, rinse your hair and body thoroughly to remove the shampoo.  Then [employer] and genitals (private parts) with your normal soap and rinse thoroughly to remove soap.  After that Use CHG Soap as you would any other liquid soap. You can apply CHG directly to the skin and wash gently with a scrungie or a clean washcloth.   Apply the CHG Soap to your body ONLY FROM THE NECK DOWN.  Do not use on open wounds or open sores. Avoid contact with your eyes, ears, mouth and genitals (private parts). Wash Face and genitals (private parts)  with your normal soap.   Wash thoroughly, paying special attention to the area where your surgery will be performed.  Thoroughly rinse your body with warm water from the neck down.  DO NOT shower/wash with your normal soap after using and rinsing off the CHG Soap.  Pat yourself dry with a CLEAN TOWEL.  Wear CLEAN PAJAMAS to bed the night before surgery  Place CLEAN SHEETS on your bed the night before your surgery  DO NOT SLEEP WITH PETS.   Day of Surgery:  Take a shower with CHG soap. Wear Clean/Comfortable clothing the morning of surgery Do not apply any deodorants/lotions.   Remember to brush your teeth WITH YOUR REGULAR TOOTHPASTE.    If you received a COVID test during your pre-op visit, it is requested that you wear a mask when out in public, stay away from anyone that may not be feeling well, and notify your surgeon if you develop symptoms. If you have been in contact with anyone that has tested positive in the last 10 days, please notify your surgeon.    Please read over the following fact sheets that you were given.

## 2022-02-11 ENCOUNTER — Encounter (HOSPITAL_COMMUNITY): Payer: Self-pay

## 2022-02-11 ENCOUNTER — Encounter (HOSPITAL_COMMUNITY)
Admission: RE | Admit: 2022-02-11 | Discharge: 2022-02-11 | Disposition: A | Discharge status: Home / Self Care | Payer: BC Managed Care – PPO | Source: Ambulatory Visit | Attending: Specialist | Admitting: Specialist

## 2022-02-11 ENCOUNTER — Other Ambulatory Visit: Payer: Self-pay

## 2022-02-11 DIAGNOSIS — Z01812 Encounter for preprocedural laboratory examination: Secondary | ICD-10-CM | POA: Insufficient documentation

## 2022-02-11 DIAGNOSIS — Z01818 Encounter for other preprocedural examination: Secondary | ICD-10-CM

## 2022-02-11 LAB — CBC
HCT: 48.7 % (ref 39.0–52.0)
Hemoglobin: 14.9 g/dL (ref 13.0–17.0)
MCH: 23.1 pg — ABNORMAL LOW (ref 26.0–34.0)
MCHC: 30.6 g/dL (ref 30.0–36.0)
MCV: 75.6 fL — ABNORMAL LOW (ref 80.0–100.0)
Platelets: 259 10*3/uL (ref 150–400)
RBC: 6.44 MIL/uL — ABNORMAL HIGH (ref 4.22–5.81)
RDW: 12.3 % (ref 11.5–15.5)
WBC: 6.7 10*3/uL (ref 4.0–10.5)
nRBC: 0 % (ref 0.0–0.2)

## 2022-02-11 LAB — SURGICAL PCR SCREEN
MRSA, PCR: NEGATIVE
Staphylococcus aureus: NEGATIVE

## 2022-02-11 NOTE — Progress Notes (Signed)
PCP - Jimmye Norman MD Cardiologist - denies  PPM/ICD - denies Device Orders -  Rep Notified -   Chest x-ray - none EKG - none Stress Test - none ECHO - none Cardiac Cath - none  Sleep Study - denies CPAP -   Fasting Blood Sugar - na Checks Blood Sugar _na times a day  Blood Thinner Instructions:na Aspirin Instructions:na  ERAS Protcol - clear liquids until 0430 PRE-SURGERY Ensure or G2- Ensure  COVID TEST- na   Anesthesia review: no  Patient denies shortness of breath, fever, cough and chest pain at PAT appointment   All instructions explained to the patient, with a verbal understanding of the material. Patient agrees to go over the instructions while at home for a better understanding. Patient also instructed to wear a mask when out in public prior to surgery.. The opportunity to ask questions was provided.

## 2022-02-13 ENCOUNTER — Other Ambulatory Visit: Payer: Self-pay

## 2022-02-15 ENCOUNTER — Encounter (HOSPITAL_COMMUNITY): Payer: Self-pay | Admitting: Specialist

## 2022-02-15 NOTE — Anesthesia Preprocedure Evaluation (Signed)
Anesthesia Evaluation  Patient identified by MRN, date of birth, ID band Patient awake    Reviewed: Allergy & Precautions, NPO status , Patient's Chart, lab work & pertinent test results, reviewed documented beta blocker date and time   Airway Mallampati: I  TM Distance: >3 FB Neck ROM: Full    Dental no notable dental hx. (+) Teeth Intact, Dental Advisory Given   Pulmonary neg pulmonary ROS,    Pulmonary exam normal breath sounds clear to auscultation       Cardiovascular negative cardio ROS Normal cardiovascular exam Rhythm:Regular Rate:Normal     Neuro/Psych negative neurological ROS  negative psych ROS   GI/Hepatic negative GI ROS, Neg liver ROS,   Endo/Other  negative endocrine ROS  Renal/GU negative Renal ROS  negative genitourinary   Musculoskeletal Herniated disc L4-L5   Abdominal   Peds  Hematology negative hematology ROS (+)   Anesthesia Other Findings   Reproductive/Obstetrics                            Anesthesia Physical Anesthesia Plan  ASA: 1  Anesthesia Plan: General   Post-op Pain Management: Tylenol PO (pre-op)*, Ketamine IV*, Dilaudid IV and Precedex   Induction: Intravenous  PONV Risk Score and Plan: 3 and Treatment may vary due to age or medical condition, Midazolam, Dexamethasone and Ondansetron  Airway Management Planned: Oral ETT  Additional Equipment: None  Intra-op Plan:   Post-operative Plan: Extubation in OR  Informed Consent: I have reviewed the patients History and Physical, chart, labs and discussed the procedure including the risks, benefits and alternatives for the proposed anesthesia with the patient or authorized representative who has indicated his/her understanding and acceptance.     Dental advisory given  Plan Discussed with: CRNA and Anesthesiologist  Anesthesia Plan Comments:        Anesthesia Quick Evaluation

## 2022-02-16 ENCOUNTER — Other Ambulatory Visit: Payer: Self-pay

## 2022-02-16 ENCOUNTER — Ambulatory Visit (HOSPITAL_COMMUNITY): Payer: BC Managed Care – PPO | Admitting: Anesthesiology

## 2022-02-16 ENCOUNTER — Observation Stay (HOSPITAL_COMMUNITY)
Admission: RE | Admit: 2022-02-16 | Discharge: 2022-02-17 | Disposition: A | Discharge status: Home / Self Care | Payer: BC Managed Care – PPO | Attending: Specialist | Admitting: Specialist

## 2022-02-16 ENCOUNTER — Ambulatory Visit (HOSPITAL_COMMUNITY)
Admission: RE | Disposition: A | Discharge status: Home / Self Care | Payer: Self-pay | Source: Home / Self Care | Attending: Specialist

## 2022-02-16 ENCOUNTER — Encounter (HOSPITAL_COMMUNITY): Payer: Self-pay | Admitting: Specialist

## 2022-02-16 ENCOUNTER — Ambulatory Visit (HOSPITAL_COMMUNITY): Payer: BC Managed Care – PPO

## 2022-02-16 DIAGNOSIS — M5116 Intervertebral disc disorders with radiculopathy, lumbar region: Secondary | ICD-10-CM | POA: Diagnosis not present

## 2022-02-16 DIAGNOSIS — Z01818 Encounter for other preprocedural examination: Secondary | ICD-10-CM

## 2022-02-16 DIAGNOSIS — Z9889 Other specified postprocedural states: Secondary | ICD-10-CM | POA: Diagnosis not present

## 2022-02-16 DIAGNOSIS — M4807 Spinal stenosis, lumbosacral region: Secondary | ICD-10-CM | POA: Diagnosis not present

## 2022-02-16 HISTORY — PX: LUMBAR LAMINECTOMY/DECOMPRESSION MICRODISCECTOMY: SHX5026

## 2022-02-16 SURGERY — LUMBAR LAMINECTOMY/DECOMPRESSION MICRODISCECTOMY
Anesthesia: General | Site: Back

## 2022-02-16 MED ORDER — MIDAZOLAM HCL 2 MG/2ML IJ SOLN
INTRAMUSCULAR | Status: DC | PRN
  Administered 2022-02-16: 2 mg via INTRAVENOUS

## 2022-02-16 MED ORDER — ACETAMINOPHEN 325 MG PO TABS: 650.0000 mg | ORAL_TABLET | ORAL | Status: DC | PRN

## 2022-02-16 MED ORDER — BUPIVACAINE HCL (PF) 0.5 % IJ SOLN
INTRAMUSCULAR | Status: AC
  Filled 2022-02-16: qty 30

## 2022-02-16 MED ORDER — ALUM & MAG HYDROXIDE-SIMETH 200-200-20 MG/5ML PO SUSP
30.0000 mL | Freq: Four times a day (QID) | ORAL | Status: DC | PRN
Start: 2022-02-16 — End: 2022-02-17

## 2022-02-16 MED ORDER — FLEET ENEMA 7-19 GM/118ML RE ENEM: 1.0000 | ENEMA | Freq: Once | RECTAL | Status: DC | PRN

## 2022-02-16 MED ORDER — PHENYLEPHRINE 80 MCG/ML (10ML) SYRINGE FOR IV PUSH (FOR BLOOD PRESSURE SUPPORT)
PREFILLED_SYRINGE | INTRAVENOUS | Status: AC
  Filled 2022-02-16: qty 10

## 2022-02-16 MED ORDER — DEXAMETHASONE 4 MG PO TABS
4.0000 mg | ORAL_TABLET | Freq: Four times a day (QID) | ORAL | Status: AC
  Administered 2022-02-16: 4 mg via ORAL
  Filled 2022-02-16: qty 1

## 2022-02-16 MED ORDER — BUPIVACAINE LIPOSOME 1.3 % IJ SUSP
INTRAMUSCULAR | Status: DC | PRN
  Administered 2022-02-16: 30 mL via INTRAMUSCULAR

## 2022-02-16 MED ORDER — KETOROLAC TROMETHAMINE 15 MG/ML IJ SOLN
7.5000 mg | Freq: Four times a day (QID) | INTRAMUSCULAR | Status: AC
  Administered 2022-02-16 – 2022-02-17 (×4): 7.5 mg via INTRAVENOUS
  Filled 2022-02-16 (×4): qty 1

## 2022-02-16 MED ORDER — METHOCARBAMOL 1000 MG/10ML IJ SOLN: 500.0000 mg | Freq: Four times a day (QID) | INTRAVENOUS | Status: DC | PRN

## 2022-02-16 MED ORDER — PHENOL 1.4 % MT LIQD: 1.0000 | OROMUCOSAL | Status: DC | PRN

## 2022-02-16 MED ORDER — CHLORHEXIDINE GLUCONATE 0.12 % MT SOLN
15.0000 mL | Freq: Once | OROMUCOSAL | Status: AC
  Administered 2022-02-16: 15 mL via OROMUCOSAL
  Filled 2022-02-16: qty 15

## 2022-02-16 MED ORDER — METHOCARBAMOL 500 MG PO TABS
500.0000 mg | ORAL_TABLET | Freq: Four times a day (QID) | ORAL | Status: DC | PRN
  Administered 2022-02-16 – 2022-02-17 (×2): 500 mg via ORAL
  Filled 2022-02-16 (×2): qty 1

## 2022-02-16 MED ORDER — ACETAMINOPHEN 10 MG/ML IV SOLN
INTRAVENOUS | Status: DC | PRN
  Administered 2022-02-16: 1000 mg via INTRAVENOUS

## 2022-02-16 MED ORDER — THROMBIN 20000 UNITS EX SOLR
CUTANEOUS | Status: DC | PRN
  Administered 2022-02-16: 20 mL via TOPICAL

## 2022-02-16 MED ORDER — SODIUM CHLORIDE 0.9% FLUSH
3.0000 mL | Freq: Two times a day (BID) | INTRAVENOUS | Status: DC
Start: 2022-02-16 — End: 2022-02-17
  Administered 2022-02-16 (×2): 3 mL via INTRAVENOUS

## 2022-02-16 MED ORDER — ONDANSETRON HCL 4 MG/2ML IJ SOLN: 4.0000 mg | Freq: Once | INTRAMUSCULAR | Status: DC | PRN

## 2022-02-16 MED ORDER — MENTHOL 3 MG MT LOZG: 1.0000 | LOZENGE | OROMUCOSAL | Status: DC | PRN

## 2022-02-16 MED ORDER — ONDANSETRON HCL 4 MG/2ML IJ SOLN
INTRAMUSCULAR | Status: AC
  Filled 2022-02-16: qty 2

## 2022-02-16 MED ORDER — FENTANYL CITRATE (PF) 250 MCG/5ML IJ SOLN
INTRAMUSCULAR | Status: AC
  Filled 2022-02-16: qty 5

## 2022-02-16 MED ORDER — MORPHINE SULFATE (PF) 2 MG/ML IV SOLN: 1.0000 mg | INTRAVENOUS | Status: DC | PRN

## 2022-02-16 MED ORDER — GABAPENTIN 300 MG PO CAPS
300.0000 mg | ORAL_CAPSULE | Freq: Three times a day (TID) | ORAL | Status: DC
Start: 2022-02-16 — End: 2022-02-17
  Administered 2022-02-16 (×3): 300 mg via ORAL
  Filled 2022-02-16 (×3): qty 1

## 2022-02-16 MED ORDER — DEXAMETHASONE SODIUM PHOSPHATE 10 MG/ML IJ SOLN
INTRAMUSCULAR | Status: DC | PRN
  Administered 2022-02-16: 10 mg via INTRAVENOUS

## 2022-02-16 MED ORDER — THROMBIN 20000 UNITS EX KIT
PACK | CUTANEOUS | Status: AC
  Filled 2022-02-16: qty 1

## 2022-02-16 MED ORDER — HYDROCODONE-ACETAMINOPHEN 10-325 MG PO TABS
2.0000 | ORAL_TABLET | ORAL | Status: DC | PRN
  Administered 2022-02-16 – 2022-02-17 (×3): 2 via ORAL
  Filled 2022-02-16 (×3): qty 2

## 2022-02-16 MED ORDER — CEFAZOLIN SODIUM-DEXTROSE 2-4 GM/100ML-% IV SOLN
2.0000 g | Freq: Three times a day (TID) | INTRAVENOUS | Status: AC
  Administered 2022-02-16 – 2022-02-17 (×2): 2 g via INTRAVENOUS
  Filled 2022-02-16 (×2): qty 100

## 2022-02-16 MED ORDER — DOCUSATE SODIUM 100 MG PO CAPS
100.0000 mg | ORAL_CAPSULE | Freq: Two times a day (BID) | ORAL | Status: DC
Start: 2022-02-16 — End: 2022-02-17
  Administered 2022-02-16 (×2): 100 mg via ORAL
  Filled 2022-02-16 (×2): qty 1

## 2022-02-16 MED ORDER — DEXAMETHASONE SODIUM PHOSPHATE 4 MG/ML IJ SOLN
4.0000 mg | Freq: Four times a day (QID) | INTRAMUSCULAR | Status: AC
  Administered 2022-02-16: 4 mg via INTRAVENOUS
  Filled 2022-02-16: qty 1

## 2022-02-16 MED ORDER — ORAL CARE MOUTH RINSE: 15.0000 mL | Freq: Once | OROMUCOSAL | Status: AC

## 2022-02-16 MED ORDER — ZOLPIDEM TARTRATE 5 MG PO TABS: 5.0000 mg | ORAL_TABLET | Freq: Every evening | ORAL | Status: DC | PRN

## 2022-02-16 MED ORDER — CEFAZOLIN SODIUM-DEXTROSE 2-4 GM/100ML-% IV SOLN
2.0000 g | INTRAVENOUS | Status: AC
  Administered 2022-02-16: 2 g via INTRAVENOUS
  Filled 2022-02-16: qty 100

## 2022-02-16 MED ORDER — PANTOPRAZOLE SODIUM 40 MG PO TBEC
40.0000 mg | DELAYED_RELEASE_TABLET | Freq: Every day | ORAL | Status: DC
  Administered 2022-02-16: 40 mg via ORAL
  Filled 2022-02-16: qty 1

## 2022-02-16 MED ORDER — PROPOFOL 10 MG/ML IV BOLUS
INTRAVENOUS | Status: AC
  Filled 2022-02-16: qty 20

## 2022-02-16 MED ORDER — ACETAMINOPHEN 10 MG/ML IV SOLN
INTRAVENOUS | Status: AC
  Filled 2022-02-16: qty 100

## 2022-02-16 MED ORDER — ROCURONIUM BROMIDE 10 MG/ML (PF) SYRINGE
PREFILLED_SYRINGE | INTRAVENOUS | Status: AC
  Filled 2022-02-16: qty 10

## 2022-02-16 MED ORDER — DICLOFENAC SODIUM 25 MG PO TBEC: 50.0000 mg | DELAYED_RELEASE_TABLET | Freq: Two times a day (BID) | ORAL | Status: DC

## 2022-02-16 MED ORDER — BUPIVACAINE LIPOSOME 1.3 % IJ SUSP
INTRAMUSCULAR | Status: AC
  Filled 2022-02-16: qty 20

## 2022-02-16 MED ORDER — LACTATED RINGERS IV SOLN: INTRAVENOUS | Status: DC

## 2022-02-16 MED ORDER — ONDANSETRON HCL 4 MG/2ML IJ SOLN
4.0000 mg | Freq: Four times a day (QID) | INTRAMUSCULAR | Status: DC | PRN
Start: 2022-02-16 — End: 2022-02-17

## 2022-02-16 MED ORDER — PHENYLEPHRINE 80 MCG/ML (10ML) SYRINGE FOR IV PUSH (FOR BLOOD PRESSURE SUPPORT)
PREFILLED_SYRINGE | INTRAVENOUS | Status: DC | PRN
  Administered 2022-02-16 (×10): 80 ug via INTRAVENOUS

## 2022-02-16 MED ORDER — BUPIVACAINE LIPOSOME 1.3 % IJ SUSP: 10.0000 mL | Freq: Once | INTRAMUSCULAR | Status: DC

## 2022-02-16 MED ORDER — DEXAMETHASONE SODIUM PHOSPHATE 10 MG/ML IJ SOLN
INTRAMUSCULAR | Status: AC
  Filled 2022-02-16: qty 1

## 2022-02-16 MED ORDER — PHENYLEPHRINE HCL-NACL 20-0.9 MG/250ML-% IV SOLN
INTRAVENOUS | Status: DC | PRN
  Administered 2022-02-16: 25 ug/min via INTRAVENOUS

## 2022-02-16 MED ORDER — SODIUM CHLORIDE 0.9% FLUSH: 3.0000 mL | INTRAVENOUS | Status: DC | PRN

## 2022-02-16 MED ORDER — LIDOCAINE 2% (20 MG/ML) 5 ML SYRINGE
INTRAMUSCULAR | Status: AC
  Filled 2022-02-16: qty 5

## 2022-02-16 MED ORDER — OXYCODONE HCL 5 MG/5ML PO SOLN: 5.0000 mg | Freq: Once | ORAL | Status: DC | PRN

## 2022-02-16 MED ORDER — HYDROMORPHONE HCL 1 MG/ML IJ SOLN: 0.2500 mg | INTRAMUSCULAR | Status: DC | PRN

## 2022-02-16 MED ORDER — ROCURONIUM BROMIDE 10 MG/ML (PF) SYRINGE
PREFILLED_SYRINGE | INTRAVENOUS | Status: DC | PRN
  Administered 2022-02-16: 70 mg via INTRAVENOUS

## 2022-02-16 MED ORDER — SUGAMMADEX SODIUM 200 MG/2ML IV SOLN
INTRAVENOUS | Status: DC | PRN
  Administered 2022-02-16: 200 mg via INTRAVENOUS

## 2022-02-16 MED ORDER — HYDROCODONE-ACETAMINOPHEN 7.5-325 MG PO TABS
1.0000 | ORAL_TABLET | Freq: Four times a day (QID) | ORAL | Status: DC
  Administered 2022-02-16 (×2): 1 via ORAL
  Filled 2022-02-16 (×2): qty 1

## 2022-02-16 MED ORDER — PANTOPRAZOLE SODIUM 40 MG IV SOLR: 40.0000 mg | Freq: Every day | INTRAVENOUS | Status: DC

## 2022-02-16 MED ORDER — SODIUM CHLORIDE 0.9 % IV SOLN
INTRAVENOUS | Status: DC
Start: 2022-02-16 — End: 2022-02-17

## 2022-02-16 MED ORDER — HYDROCODONE-ACETAMINOPHEN 10-325 MG PO TABS: 1.0000 | ORAL_TABLET | ORAL | Status: DC | PRN

## 2022-02-16 MED ORDER — 0.9 % SODIUM CHLORIDE (POUR BTL) OPTIME
TOPICAL | Status: DC | PRN
  Administered 2022-02-16: 1000 mL

## 2022-02-16 MED ORDER — BISACODYL 5 MG PO TBEC: 5.0000 mg | DELAYED_RELEASE_TABLET | Freq: Every day | ORAL | Status: DC | PRN

## 2022-02-16 MED ORDER — ONDANSETRON HCL 4 MG PO TABS: 4.0000 mg | ORAL_TABLET | Freq: Four times a day (QID) | ORAL | Status: DC | PRN

## 2022-02-16 MED ORDER — POLYETHYLENE GLYCOL 3350 17 G PO PACK
17.0000 g | PACK | Freq: Every day | ORAL | Status: DC | PRN
Start: 2022-02-16 — End: 2022-02-17

## 2022-02-16 MED ORDER — SODIUM CHLORIDE 0.9 % IV SOLN: 250.0000 mL | INTRAVENOUS | Status: DC

## 2022-02-16 MED ORDER — ONDANSETRON HCL 4 MG/2ML IJ SOLN
INTRAMUSCULAR | Status: DC | PRN
  Administered 2022-02-16: 4 mg via INTRAVENOUS

## 2022-02-16 MED ORDER — PROPOFOL 10 MG/ML IV BOLUS
INTRAVENOUS | Status: DC | PRN
  Administered 2022-02-16: 200 mg via INTRAVENOUS

## 2022-02-16 MED ORDER — ACETAMINOPHEN 650 MG RE SUPP: 650.0000 mg | RECTAL | Status: DC | PRN

## 2022-02-16 MED ORDER — FENTANYL CITRATE (PF) 250 MCG/5ML IJ SOLN
INTRAMUSCULAR | Status: DC | PRN
Start: 2022-02-16 — End: 2022-02-16
  Administered 2022-02-16: 50 ug via INTRAVENOUS
  Administered 2022-02-16 (×2): 100 ug via INTRAVENOUS

## 2022-02-16 MED ORDER — OXYCODONE HCL 5 MG PO TABS: 5.0000 mg | ORAL_TABLET | Freq: Once | ORAL | Status: DC | PRN

## 2022-02-16 MED ORDER — MIDAZOLAM HCL 2 MG/2ML IJ SOLN
INTRAMUSCULAR | Status: AC
  Filled 2022-02-16: qty 2

## 2022-02-16 MED ORDER — LIDOCAINE 2% (20 MG/ML) 5 ML SYRINGE
INTRAMUSCULAR | Status: DC | PRN
  Administered 2022-02-16: 75 mg via INTRAVENOUS

## 2022-02-16 SURGICAL SUPPLY — 51 items
BAG COUNTER SPONGE SURGICOUNT (BAG) ×2 IMPLANT
BENZOIN TINCTURE PRP APPL 2/3 (GAUZE/BANDAGES/DRESSINGS) ×1 IMPLANT
BUR SABER RD CUTTING 3.0 (BURR) IMPLANT
CANISTER SUCT 3000ML PPV (MISCELLANEOUS) ×2 IMPLANT
CLSR STERI-STRIP ANTIMIC 1/2X4 (GAUZE/BANDAGES/DRESSINGS) ×1 IMPLANT
COVER SURGICAL LIGHT HANDLE (MISCELLANEOUS) ×2 IMPLANT
DERMABOND ADVANCED (GAUZE/BANDAGES/DRESSINGS) ×1
DERMABOND ADVANCED .7 DNX12 (GAUZE/BANDAGES/DRESSINGS) ×1 IMPLANT
DRAPE HALF SHEET 40X57 (DRAPES) IMPLANT
DRAPE INCISE IOBAN 66X45 STRL (DRAPES) IMPLANT
DRAPE MICROSCOPE LEICA (MISCELLANEOUS) ×2 IMPLANT
DRAPE SURG 17X23 STRL (DRAPES) ×8 IMPLANT
DRESSING MEPILEX FLEX 4X4 (GAUZE/BANDAGES/DRESSINGS) IMPLANT
DRSG MEPILEX BORDER 4X4 (GAUZE/BANDAGES/DRESSINGS) IMPLANT
DRSG MEPILEX BORDER 4X8 (GAUZE/BANDAGES/DRESSINGS) IMPLANT
DRSG MEPILEX FLEX 4X4 (GAUZE/BANDAGES/DRESSINGS) ×2
DURAPREP 26ML APPLICATOR (WOUND CARE) ×2 IMPLANT
ELECT REM PT RETURN 9FT ADLT (ELECTROSURGICAL) ×2
ELECTRODE REM PT RTRN 9FT ADLT (ELECTROSURGICAL) ×1 IMPLANT
EVACUATOR 1/8 PVC DRAIN (DRAIN) IMPLANT
GLOVE BIOGEL PI IND STRL 8 (GLOVE) ×1 IMPLANT
GLOVE BIOGEL PI INDICATOR 8 (GLOVE) ×1
GLOVE ECLIPSE 9.0 STRL (GLOVE) ×2 IMPLANT
GLOVE ORTHO TXT STRL SZ7.5 (GLOVE) ×2 IMPLANT
GLOVE SURG 8.5 LATEX PF (GLOVE) ×2 IMPLANT
GOWN STRL REUS W/ TWL LRG LVL3 (GOWN DISPOSABLE) ×1 IMPLANT
GOWN STRL REUS W/TWL 2XL LVL3 (GOWN DISPOSABLE) ×4 IMPLANT
GOWN STRL REUS W/TWL LRG LVL3 (GOWN DISPOSABLE) ×1
KIT BASIN OR (CUSTOM PROCEDURE TRAY) ×2 IMPLANT
KIT TURNOVER KIT B (KITS) ×2 IMPLANT
NDL SPNL 18GX3.5 QUINCKE PK (NEEDLE) ×2 IMPLANT
NEEDLE SPNL 18GX3.5 QUINCKE PK (NEEDLE) ×4 IMPLANT
NS IRRIG 1000ML POUR BTL (IV SOLUTION) ×2 IMPLANT
PACK LAMINECTOMY ORTHO (CUSTOM PROCEDURE TRAY) ×2 IMPLANT
PAD ARMBOARD 7.5X6 YLW CONV (MISCELLANEOUS) ×4 IMPLANT
PATTIES SURGICAL .5 X.5 (GAUZE/BANDAGES/DRESSINGS) IMPLANT
PATTIES SURGICAL .75X.75 (GAUZE/BANDAGES/DRESSINGS) IMPLANT
PATTIES SURGICAL 1X1 (DISPOSABLE) IMPLANT
SPONGE SURGIFOAM ABS GEL 100 (HEMOSTASIS) IMPLANT
SPONGE T-LAP 4X18 ~~LOC~~+RFID (SPONGE) IMPLANT
SUT VIC AB 0 CT1 27 (SUTURE)
SUT VIC AB 0 CT1 27XBRD ANBCTR (SUTURE) IMPLANT
SUT VIC AB 1 CT1 27 (SUTURE)
SUT VIC AB 1 CT1 27XBRD ANBCTR (SUTURE) IMPLANT
SUT VIC AB 2-0 CT1 27 (SUTURE)
SUT VIC AB 2-0 CT1 TAPERPNT 27 (SUTURE) IMPLANT
SUT VIC AB 3-0 X1 27 (SUTURE) ×3 IMPLANT
SUT VICRYL 0 UR6 27IN ABS (SUTURE) ×2 IMPLANT
TOWEL GREEN STERILE (TOWEL DISPOSABLE) ×2 IMPLANT
TOWEL GREEN STERILE FF (TOWEL DISPOSABLE) ×2 IMPLANT
TRAY FOLEY MTR SLVR 16FR STAT (SET/KITS/TRAYS/PACK) IMPLANT

## 2022-02-16 NOTE — Anesthesia Postprocedure Evaluation (Signed)
Anesthesia Post Note  Patient: Isaac Fletcher  Procedure(s) Performed: LEFT FAR LATERAL APPROACH TO REMOVE LEFT [name] DISC HERNIATION (Back)     Patient location during evaluation: PACU Anesthesia Type: General Level of consciousness: awake and alert and oriented Pain management: pain level controlled Vital Signs Assessment: post-procedure vital signs reviewed and stable Respiratory status: spontaneous breathing, nonlabored ventilation and respiratory function stable Cardiovascular status: blood pressure returned to baseline and stable Postop Assessment: no apparent nausea or vomiting Anesthetic complications: no   No notable events documented.  Last Vitals:  Vitals:   02/16/22 1040 02/16/22 1055  BP: 99/74 101/75  Pulse: (!) 56 (!) 58  Resp: 15 19  Temp:    SpO2: 98% 98%    Last Pain:  Vitals:   02/16/22 1055  TempSrc:   PainSc: 0-No pain                 Hamp Moreland A.

## 2022-02-16 NOTE — Anesthesia Procedure Notes (Signed)
Procedure Name: Intubation Date/Time: 02/16/2022 8:00 AM  Performed by: Michele Rockers, CRNAPre-anesthesia Checklist: Patient identified, Patient being monitored, Timeout performed, Emergency Drugs available and Suction available Patient Re-evaluated:Patient Re-evaluated prior to induction Oxygen Delivery Method: Circle System Utilized Preoxygenation: Pre-oxygenation with 100% oxygen Induction Type: IV induction Ventilation: Mask ventilation without difficulty Laryngoscope Size: Miller and 3 Grade View: Grade I Tube type: Oral Tube size: 7.5 mm Number of attempts: 1 Airway Equipment and Method: Stylet Placement Confirmation: ETT inserted through vocal cords under direct vision, positive ETCO2 and breath sounds checked- equal and bilateral Secured at: 23 cm Tube secured with: Tape Dental Injury: Teeth and Oropharynx as per pre-operative assessment

## 2022-02-16 NOTE — Transfer of Care (Signed)
Immediate Anesthesia Transfer of Care Note  Patient: Isaac Fletcher  Procedure(s) Performed: LEFT FAR LATERAL APPROACH TO REMOVE LEFT [name] DISC HERNIATION (Back)  Patient Location: PACU  Anesthesia Type:General  Level of Consciousness: drowsy, patient cooperative and responds to stimulation  Airway & Oxygen Therapy: Patient Spontanous Breathing and Patient connected to nasal cannula oxygen  Post-op Assessment: Report given to RN and Post -op Vital signs reviewed and stable  Post vital signs: Reviewed and stable  Last Vitals:  Vitals Value Taken Time  BP    Temp    Pulse 61 02/16/22 1022  Resp    SpO2 99 % 02/16/22 1022  Vitals shown include unvalidated device data.  Last Pain:  Vitals:   02/16/22 0610  TempSrc:   PainSc: 0-No pain      Patients Stated Pain Goal: 0 (02/16/22 0610)  Complications: No notable events documented.

## 2022-02-16 NOTE — Op Note (Signed)
02/16/2022  10:24 AM  PATIENT:  Isaac Fletcher  41 y.o. 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    PROCEDURE: The patient was met in the holding area, and the appropriate left [name] identified and marked with an "X" and my initials.  The patient was then transported to [place] #5. The patient was then placed under  general anesthesia without difficulty and was placed on the operative table in a prone position. [name] frame and sliding OR table. The patient received appropriate preoperative antibiotic prophylaxis Ancef 2 g.   The thoracolumbar spine then prepped using sterile conditions with [name] and draped using sterile technique.  Time-out procedure was called and correct. [name] and head lamp was used. 2 spinal needles placed to the left of the midline by 3.5 cm and intraoperative lateral C-arm demonstrated the lower needle directed towards the [name] disc on the left side. Skin infiltrated in the left side paramedian skin area with [name]. Skin incision approximately 3-1/2 cm in length sagittal orientation paramedian approximately 3-1/2 cm for the left of the midline. Incision through skin and subcutaneous layers to the lumbodorsal fascia bleeders controlled using electrocautery. Lumbodorsal fascia then incised in line with the skin incision and then blunt dissection using a finger down to the level of the transverse processes of the [name] lateral  approach. The [name] then placed in the incision and lateral C-arm demonstrated the retractor slightly superior to the [name] disc space so exposure obtained of the posterior lateral aspect of the [name] level identifying the transverse process of L5 and L4 then placing additional medial and lateral retractors. Small amount of muscle was debrided over the posterior aspect of the intertransverse membrane. And then a small amount of the superior aspect of the transverse process of L5 was resected using 2 and 3 mm [name] releasing the intertransverse membrane and ligament. The operating room microscope carefully draped brought into the field sterilely. Under the operating room microscope then the superior medial and to the transverse process of L5 was carefully exposed and the pedicle of L5 identified. Small leash of vessels cauterized using bipolar electrocautery just medial and superior to the transverse process of L5 at its intersection with the pedicle and the lateral aspect of the superior articular process of L5. [name] was then used to carefully palpate along the superior lateral aspect of the L5 pedicle identifing the [name] disc space. The dericho retractor  then used the carefully retract the inferior medial aspect of the [name] superiorly and laterally. With this then identified with careful palpation using a [name] [name] then placed in the disc space and lateral radiograph confirmed the needle at the [name] dis. 15 blade scalpel then used to perform a vertical incision into the posterior lateral aspect of the [name] disc and the opening into the disc developed using micropituitary as well as pituitaries with teeth directing the pituitaries medially,  resecting disc material. Several small fragments of disc material were resected from the area just beneath the annulus. Intraoperative cross table lateral radiograph identified the [name] completing localization during the procedure. Careful inspection of the neuroforamen of a ball-tipped probe as well as a hockey-stick nerve probe demonstrated neuroforamen to be free and the [name] exiting without further compression following the resection of the small amount of annulus and entry into the disc material. Additionally the nerve root was freed from pressure posteriorly this area by resecting portion of the intertransverse ligament as it pressed nerve forward against this protruded disc. Irrigation was then carried out using copious amounts of irrigant solution. Minimal bleeding is present felt to represent bleeding from the disc itself. There was no active bleeding that would be cauterized her otherwise. In careful then carefully the MIS retractors were then removed exploring the incision with its removal no active bleeders were noted. Incision then closed using a [name] to the lumbodorsal fascia and subcutaneous layers. Superficial layers of proximal with interrupted 2-0 Vicryl sutures and the skin closed with a stainless steel staples then [name]. All instrument and sponge counts were correct. Patient was then returned to supine position reactivated extubated and returned to recovery room in satisfactory condition.  [name], [name] perform the duties of assistant surgeon during this case. He was present from the beginning of the case to the end of the case assisting in transfer the patient from his stretcher to the OR table and back to the stretcher at the end of the case. Assisted in careful retraction and suction of the laminectomy site delicate neural structures operating under the operating room microscope. He performed closure of the incision from the fascia to the skin applying the dressing.         [name]  02/16/2022, 10:24 AM

## 2022-02-16 NOTE — H&P (Signed)
 Office Visit Note   Patient: Isaac Fletcher           Date of Birth: 03/23/1981           MRN: [id number] Visit Date: 02/05/2022              Requested by: Miller, Stephen M, MD [address] [place],  [place] [address] PCP: Miller, Stephen M, MD   Assessment & Plan: Visit Diagnoses:  1. Radiculopathy, lumbar region   2. 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  Surgical procedure discussed along potential hospital stay.  All questions answered with the help of interpreter.     Follow-Up Instructions: Return for Return office visit 1 week postop.   Orders:  No orders of the defined types were placed in this encounter.  No orders of the defined types were placed in this encounter.     Procedures: No procedures performed   Clinical Data: No additional findings.   Subjective: Chief Complaint  Patient presents with   Lower Back - Pain    HPI 41-year-old male with history of left [name] far lateral HNP comes in for preop evaluation.  States that symptoms unchanged from previous visit.  He is wanting to proceed with LEFT FAR LATERAL APPROACH TO REMOVE LEFT [name] DISC HERNIATION scheduled.  Today history physical performed.  Operative present. Review of Systems  Constitutional:  Positive for activity change.  HENT: Negative.    Respiratory: Negative.    Cardiovascular: Negative.   Gastrointestinal:  Positive for constipation.  Musculoskeletal:  Positive for back pain and gait problem.  Neurological:  Positive for weakness and numbness.  Psychiatric/Behavioral: Negative.    No current cardiopulmonary GI/GU issues.  Denies fever chills.  Objective: Vital Signs: BP 130/89   Pulse (!) 50   Temp 98.8 F (37.1 C)   SpO2 98%   Physical Exam Constitutional:      Appearance: Normal appearance.  HENT:     Head: Normocephalic.     Nose: Nose normal.   Eyes:     Extraocular Movements: Extraocular movements intact.  Cardiovascular:     Rate and Rhythm: Regular rhythm.     Heart sounds: Murmur heard.  Pulmonary:     Effort: Pulmonary effort is normal. No respiratory distress.     Breath sounds: Normal breath sounds.  Neurological:     Mental Status: He is alert.    Ortho Exam  Specialty Comments:  MRI LUMBAR SPINE WITHOUT AND WITH CONTRAST     TECHNIQUE:  Multiplanar and multiecho pulse sequences of the lumbar spine were  obtained without and with intravenous contrast.     CONTRAST:  18mL MULTIHANCE GADOBENATE DIMEGLUMINE 529 MG/ML IV SOLN     COMPARISON:  MRI of the lumbar spine November 14, 2020.     FINDINGS:  Segmentation: Standard segmentation is seen. The inferior-most fully  formed intervertebral disc is labeled L5-S1. Same numbering as on  the prior MRI.     Alignment: Straightening of the normal lumbar lordosis. No  substantial sagittal subluxation.     Vertebrae: Congenitally short pedicles. Vertebral body heights are  maintained. No focal marrow edema to suggest acute fracture or  discitis/osteomyelitis.     Conus medullaris and cauda equina: Conus extends to the L2 level.  Conus and cauda equina appear normal. No abnormal enhancement of the  conus   or cauda equina.     Paraspinal and other soft tissues: Postoperative changes in the  lower left lumbar spine. Otherwise, unremarkable.     Disc levels:     T12-L1: No significant disc protrusion, foraminal stenosis, or canal  stenosis.     L1-L2: No significant disc protrusion, foraminal stenosis, or canal  stenosis.     L2-L3: No significant disc protrusion, foraminal stenosis, or canal  stenosis.     L3-L4: No significant disc protrusion, foraminal stenosis, or canal  stenosis.     L4-L5: Disc height loss and desiccation. Broad disc bulge. Enhancing  scar tissue in the posterolateral left canal and left foramen,  compatible with sequela interval  micro discectomy. This scar tissue  limits assessment; however, left foraminal stenosis is likely  improved without evidence of recurrent disc herniation. No  significant canal stenosis. Similar moderate right foraminal  stenosis.     L5-S1: Mild disc bulging with moderate right and mild left foraminal  stenosis. No significant canal stenosis.     IMPRESSION:  1. Interval left L4-L5 micro discectomy. Scar tissue in the left  foramen and lateral canal limits assessment, but suspect improved  foraminal stenosis without evidence of recurrent herniation. Similar  moderate right foraminal stenosis at this level.  2. At L5-S1, similar moderate right and mild left foraminal  stenosis.        Electronically Signed    By: Frederick S Jones M.D.    On: 07/07/2021 12:07  Imaging: No results found.   PMFS History: Patient Active Problem List   Diagnosis Date Noted   Status post lumbar microdiscectomy 04/08/2021   Surgery, elective    Lumbar foraminal stenosis 02/19/2021   Trochanteric bursitis, left hip 02/19/2021   Past Medical History:  Diagnosis Date   Back pain    Constipation    H/O hernia repair     Family History  Problem Relation Age of Onset   Diabetes Father     Past Surgical History:  Procedure Laterality Date   HERNIA REPAIR     Herniated Disc     LUMBAR LAMINECTOMY N/A 03/21/2021   Procedure: LEFT LEFT FOUR-FIVE MICRODISCECTOMY;  Surgeon: Yates, Mark C, MD;  Location: [facility] OR;  Service: Orthopedics;  Laterality: N/A;   Social History   Occupational History   Not on file  Tobacco Use   Smoking status: Never   Smokeless tobacco: Never  Vaping Use   Vaping Use: Never used  Substance and Sexual Activity   Alcohol use: Yes   Drug use: Never   Sexual activity: Not on file          Not on file  Tobacco Use   Smoking status: Never   Smokeless tobacco: Never  Vaping Use   Vaping Use: Never used  Substance and Sexual Activity   Alcohol use: Yes   Drug use: Never   Sexual  activity: Not on file

## 2022-02-16 NOTE — Discharge Instructions (Addendum)
    No lifting greater than 10 lbs. Avoid bending, stooping and twisting. Walk in house for first week them may start to get out slowly increasing distance up to one mile by 3 weeks post op. Keep incision dry for 3 days, may use tegaderm or similar water impervious dressing.  

## 2022-02-16 NOTE — Interval H&P Note (Signed)
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  The various methods of treatment have been discussed with the patient and family. After consideration of risks, benefits and other options for treatment, the patient has consented to  Procedure(s): LEFT FAR LATERAL APPROACH TO REMOVE LEFT [name] DISC HERNIATION (N/A) as a surgical intervention.  The patient's history has been reviewed, patient examined, no change in status, stable for surgery.  I have reviewed the patient's chart and labs.  Questions were answered to the patient's satisfaction.     Vira Browns

## 2022-02-16 NOTE — Brief Op Note (Signed)
02/16/2022  10:22 AM  PATIENT:  Isaac Fletcher  41 y.o. 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  PROCEDURE:  Procedure(s): LEFT FAR LATERAL APPROACH TO REMOVE LEFT [name] DISC HERNIATION (N/A)  SURGEON:  Surgeon(s) and Role:    * Kerrin Champagne, MD - Primary  PHYSICIAN ASSISTANT: Zonia Kief, [name]  ANESTHESIA:   local and general  EBL:  50 mL   BLOOD ADMINISTERED:none  DRAINS: none   LOCAL MEDICATIONS USED:  MARCAINE 0.5% 1:1 Exparel`1.3% Amount: 30 ml   SPECIMEN:  No Specimen  DISPOSITION OF SPECIMEN:  N/A  COUNTS:  YES  TOURNIQUET:  * No tourniquets in log *  DICTATION: .Dragon Dictation  PLAN OF CARE: Admit for overnight observation  PATIENT DISPOSITION:  PACU - hemodynamically stable.   Delay start of Pharmacological VTE agent (>24hrs) due to surgical blood loss or risk of bleeding: yes

## 2022-02-17 ENCOUNTER — Encounter (HOSPITAL_COMMUNITY): Payer: Self-pay | Admitting: Specialist

## 2022-02-17 DIAGNOSIS — M5116 Intervertebral disc disorders with radiculopathy, lumbar region: Secondary | ICD-10-CM | POA: Diagnosis not present

## 2022-02-17 MED ORDER — GABAPENTIN 300 MG PO CAPS: 300.0000 mg | ORAL_CAPSULE | Freq: Three times a day (TID) | ORAL | 1 refills | Status: DC

## 2022-02-17 MED ORDER — DOCUSATE SODIUM 100 MG PO CAPS
100.0000 mg | ORAL_CAPSULE | Freq: Two times a day (BID) | ORAL | 0 refills | Status: AC
Start: 2022-02-17 — End: ?

## 2022-02-17 MED ORDER — METHOCARBAMOL 500 MG PO TABS: 500.0000 mg | ORAL_TABLET | Freq: Four times a day (QID) | ORAL | 1 refills | Status: DC | PRN

## 2022-02-17 MED ORDER — ATROPINE SULFATE 1 MG/10ML IJ SOSY
PREFILLED_SYRINGE | INTRAMUSCULAR | Status: AC
  Filled 2022-02-17: qty 10

## 2022-02-17 MED ORDER — HYDROCODONE-ACETAMINOPHEN 10-325 MG PO TABS: 1.0000 | ORAL_TABLET | ORAL | 0 refills | Status: DC | PRN

## 2022-02-17 MED FILL — Thrombin For Soln Kit 20000 Unit: CUTANEOUS | Qty: 1 | Status: AC

## 2022-02-17 NOTE — Progress Notes (Signed)
PT Cancellation Note  Patient Details Name: Isaac Fletcher MRN: [id number] DOB: 27-Sep-1980   Cancelled Treatment:    Reason Eval/Treat Not Completed: PT screened, no needs identified, will sign off (pt seen by OT, independent at baseline without PT need at this time)   [name] [name] 02/17/2022, 8:25 AM Merryl Hacker, PT Acute Rehabilitation Services Office: [phone]

## 2022-02-17 NOTE — Evaluation (Signed)
Occupational Therapy Evaluation Patient Details Name: Isaac Fletcher MRN: [id number] DOB: 04/16/1981 Today's Date: 02/17/2022   History of Present Illness Isaac Fletcher  41 y.o. male who is s/p LEFT FAR LATERAL APPROACH TO REMOVE LEFT [name] DISC HERNIATION. 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 He lives in an apartment with his brother. After review of back precautions and compensatory techniques pt demonstrated mod I ability to complete ADLs and functional mobility. He does not require further OT acutely. Recommend d/c to home without need for DME or OT follow up.      Recommendations for follow up therapy are one component of a multi-disciplinary discharge planning process, led by the attending physician.  Recommendations may be updated based on patient status, additional functional criteria and insurance authorization.   Follow Up Recommendations  No OT follow up    Assistance Recommended at Discharge PRN  Patient can return home with the following Assist for transportation    Functional Status Assessment  Patient has had a recent decline in their functional status and demonstrates the ability to make significant improvements in function in a reasonable and predictable amount of time.  Equipment Recommendations  None recommended by OT       Precautions / Restrictions Precautions Precautions: Fall;Back Precaution Booklet Issued: Yes (comment) Restrictions Weight Bearing Restrictions: No      Mobility Bed Mobility               General bed mobility comments: pt OOB in the bathroom upon arrival, verbally reviewed log roll    Transfers Overall transfer level: Modified independent                        Balance Overall balance assessment: Modified Independent                                         ADL either  performed or assessed with clinical judgement   ADL Overall ADL's : Modified independent;Needs assistance/impaired                                     Functional mobility during ADLs: Modified independent General ADL Comments: after review of back precautions and compensatory techniques, pt was mod I for ADLs and functional mobility     Vision Baseline Vision/History: 0 No visual deficits Vision Assessment?: No apparent visual deficits     Perception     Praxis      Pertinent Vitals/Pain       Hand Dominance Right   Extremity/Trunk Assessment Upper Extremity Assessment Upper Extremity Assessment: Overall WFL for tasks assessed   Lower Extremity Assessment Lower Extremity Assessment: Overall WFL for tasks assessed   Cervical / Trunk Assessment Cervical / Trunk Assessment: Back Surgery   Communication Communication Communication: Prefers language other than English   Cognition Arousal/Alertness: Awake/alert Behavior During Therapy: WFL for tasks assessed/performed                                         General Comments  VSS on RA  Home Living Family/patient expects to be discharged to:: Private residence Living Arrangements: Other relatives Available Help at Discharge: Family Type of Home: Apartment Home Access: Stairs to enter Technical brewer of Steps: 3 Entrance Stairs-Rails: None Home Layout: One level     Bathroom Shower/Tub: Teacher, early years/pre: Standard     Home Equipment: None   Additional Comments: lives with brother      Prior Functioning/Environment Prior Level of Function : Independent/Modified Independent;Working/employed;Driving                        OT Problem List: Decreased knowledge of precautions;Pain      OT Treatment/Interventions:      OT Goals(Current goals can be found in the care plan section) Acute Rehab OT Goals Patient Stated Goal: home OT Goal  Formulation: With patient Time For Goal Achievement: 02/17/22 Potential to Achieve Goals: Good   AM-PAC OT "6 Clicks" Daily Activity     Outcome Measure Help from another person eating meals?: None Help from another person taking care of personal grooming?: None Help from another person toileting, which includes using toliet, bedpan, or urinal?: None Help from another person bathing (including washing, rinsing, drying)?: None Help from another person to put on and taking off regular upper body clothing?: None Help from another person to put on and taking off regular lower body clothing?: None 6 Click Score: 24   End of Session Nurse Communication: Mobility status  Activity Tolerance: Patient tolerated treatment well Patient left: in bed;with call bell/phone within reach  OT Visit Diagnosis: Pain                Time: 0806-0820 OT Time Calculation (min): 14 min Charges:  OT General Charges $OT Visit: 1 Visit OT Evaluation $OT Eval Low Complexity: 1 Low   Takahiro Godinho A Shanetta Nicolls 02/17/2022, 9:05 AM

## 2022-02-17 NOTE — Progress Notes (Signed)
     Subjective: 1 Day Post-Op Procedure(s) (LRB): LEFT FAR LATERAL APPROACH TO REMOVE LEFT [name] DISC HERNIATION (N/A) Awake, alert and oriented x 4. Dressing changed earlier today, dry. Voiding without difficulty.  Tolerating po nourishment and narcotics. Left leg pain is gone.   Patient reports pain as mild.    Objective:   VITALS:  Temp:  [97 F (36.1 C)-98.7 F (37.1 C)] 98.1 F (36.7 C) (06/13 0359) Pulse Rate:  [43-68] 50 (06/13 0359) Resp:  [15-20] 20 (06/13 0359) BP: (98-118)/(69-82) 118/82 (06/13 0359) SpO2:  [98 %-100 %] 99 % (06/13 0359)  Neurologically intact ABD soft Neurovascular intact Sensation intact distally Intact pulses distally Dorsiflexion/Plantar flexion intact Incision: dressing C/D/I and no drainage   LABS No results for input(s): "HGB", "WBC", "PLT" in the last 72 hours. No results for input(s): "NA", "K", "CL", "CO2", "BUN", "CREATININE", "GLUCOSE" in the last 72 hours. No results for input(s): "LABPT", "INR" in the last 72 hours.   Assessment/Plan: 1 Day Post-Op Procedure(s) (LRB): LEFT FAR LATERAL APPROACH TO REMOVE LEFT [name] DISC HERNIATION (N/A)  Advance diet Up with therapy Plan for discharge tomorrow  [name] 02/17/2022, 7:55 AM Patient ID: Isaac Fletcher, male   DOB: 1981-08-20, 41 y.o.   MRN: [id number]

## 2022-02-17 NOTE — Plan of Care (Signed)
Pt given D/C instructions with verbal understanding. Rx's were sent to the pharmacy by MD. Pt's incision is clean and dry with no sign of infection. Pt's IV was removed prior to D/C. Pt D/C'd home via wheelchair per MD order. Pt is stable @ D/C and has no other needs at this time. Kamsiyochukwu Buist, RN  

## 2022-02-23 ENCOUNTER — Other Ambulatory Visit: Payer: Self-pay | Admitting: Specialist

## 2022-02-23 ENCOUNTER — Other Ambulatory Visit (HOSPITAL_COMMUNITY): Payer: Self-pay

## 2022-02-23 MED ORDER — HYDROCODONE-ACETAMINOPHEN 10-325 MG PO TABS
1.0000 | ORAL_TABLET | ORAL | 0 refills | Status: DC | PRN
  Filled 2022-02-23: qty 30, 5d supply, fill #0

## 2022-02-23 NOTE — Telephone Encounter (Signed)
Pt's brother [facility] called requesting refill of hydrocodone. Please send to pharmacy on file. Pt phone number is [phone].

## 2022-02-24 ENCOUNTER — Other Ambulatory Visit (HOSPITAL_COMMUNITY): Payer: Self-pay

## 2022-03-04 ENCOUNTER — Other Ambulatory Visit (HOSPITAL_COMMUNITY): Payer: Self-pay

## 2022-03-05 ENCOUNTER — Other Ambulatory Visit (HOSPITAL_COMMUNITY): Payer: Self-pay

## 2022-03-05 ENCOUNTER — Ambulatory Visit (INDEPENDENT_AMBULATORY_CARE_PROVIDER_SITE_OTHER): Payer: BC Managed Care – PPO | Admitting: Surgery

## 2022-03-05 DIAGNOSIS — Z9889 Other specified postprocedural states: Secondary | ICD-10-CM

## 2022-03-05 MED ORDER — METHOCARBAMOL 500 MG PO TABS
500.0000 mg | ORAL_TABLET | Freq: Four times a day (QID) | ORAL | 0 refills | Status: DC | PRN
  Filled 2022-03-05: qty 60, 15d supply, fill #0

## 2022-03-05 MED ORDER — DICLOFENAC SODIUM 75 MG PO TBEC
75.0000 mg | DELAYED_RELEASE_TABLET | Freq: Two times a day (BID) | ORAL | 1 refills | Status: DC | PRN
  Filled 2022-03-05: qty 60, 30d supply, fill #0
  Filled 2022-04-07: qty 60, 30d supply, fill #1

## 2022-03-05 NOTE — Progress Notes (Signed)
41 year old black male who is 2 weeks status post left far lateral [name] microdiscectomy returns.  States that he is doing well.  He feels like his leg strength is improving.  No longer has pain radiating down to his foot.  He is pleased at this point.   Exam Pleasant male alert and oriented in no acute distress.  Comes in with an interpreter.  Wound looks good.  No drainage or signs infection.  New Steri-Strips applied.  He has normal and symmetric bilateral anterior tib and gastroc strength.  This is resolved from preop left lower extremity weakness.   Plan Advised patient to gradually increase his walking distances over the next few weeks.  Nothing aggressive.  Avoid bending and twisting.  I sent in prescriptions for Voltaren 75 mg twice daily as needed to be taken with food and Robaxin to his pharmacy.  He will discontinue the Voltaren 50 mg that he has at home.  Follow with Dr. Otelia Sergeant in 4 weeks for recheck.  Return sooner if needed.

## 2022-03-18 NOTE — Discharge Summary (Signed)
Patient ID: Isaac Fletcher MRN: [id number] DOB/AGE: 1981-02-12 41 y.o.  Admit date: 02/16/2022 Discharge date: 02/17/2022  Admission Diagnoses:  Principal Problem:   Status post lumbar laminectomy Active Problems:   Lumbar disc herniation with radiculopathy   Discharge Diagnoses:  Principal Problem:   Status post lumbar laminectomy Active Problems:   Lumbar disc herniation with radiculopathy  status post Procedure(s): LEFT FAR LATERAL APPROACH TO REMOVE LEFT [name] DISC HERNIATION  Past Medical History:  Diagnosis Date   Back pain    Constipation    H/O hernia repair     Surgeries: Procedure(s): LEFT FAR LATERAL APPROACH TO REMOVE LEFT [name] DISC HERNIATION on 02/16/2022   Consultants:   Discharged Condition: Improved  Hospital Course: Isaac Fletcher is an 41 y.o. male who was admitted 02/16/2022 for operative treatment of Status post lumbar laminectomy. Patient failed conservative treatments (please see the history and physical for the specifics) and had severe unremitting pain that affects sleep, daily activities and work/hobbies. After pre-op clearance, the patient was taken to the operating room on 02/16/2022 and underwent  Procedure(s): LEFT FAR LATERAL APPROACH TO REMOVE LEFT [name] DISC HERNIATION.    Patient was given perioperative antibiotics:  Anti-infectives (From admission, onward)    Start     Dose/Rate Route Frequency Ordered Stop   02/16/22 1600  ceFAZolin (ANCEF) IVPB 2g/100 mL premix        2 g 200 mL/hr over 30 Minutes Intravenous Every 8 hours 02/16/22 1111 02/17/22 0135   02/16/22 0600  ceFAZolin (ANCEF) IVPB 2g/100 mL premix        2 g 200 mL/hr over 30 Minutes Intravenous On call to O.R. 02/16/22 0630 02/16/22 0824        Patient was given sequential compression devices and early ambulation to prevent DVT.   Patient benefited maximally from hospital stay and there were no complications. At the time of discharge, the patient was urinating/moving  their bowels without difficulty, tolerating a regular diet, pain is controlled with oral pain medications and they have been cleared by PT/OT.   Recent vital signs: No data found.   Recent laboratory studies: No results for input(s): "WBC", "HGB", "HCT", "PLT", "NA", "K", "CL", "CO2", "BUN", "CREATININE", "GLUCOSE", "INR", "CALCIUM" in the last 72 hours.  Invalid input(s): "PT", "2"   Discharge Medications:   Allergies as of 02/17/2022   No Known Allergies      Medication List     STOP taking these medications    HYDROcodone-acetaminophen 7.5-325 MG tablet Commonly known as: NORCO       TAKE these medications    CENTRUM ADULT PO Take 1 tablet by mouth daily.   docusate sodium 100 MG capsule Commonly known as: COLACE Take 1 capsule (100 mg total) by mouth 2 (two) times daily.   gabapentin 300 MG capsule Commonly known as: NEURONTIN Take 1 capsule (300 mg total) by mouth 3 (three) times daily.   methocarbamol 500 MG tablet Commonly known as: ROBAXIN Take 1 tablet (500 mg total) by mouth every 6 (six) hours as needed for muscle spasms.        Diagnostic Studies: No results found.  Discharge Instructions     Call MD / Call 911   Complete by: As directed    If you experience chest pain or shortness of breath, CALL 911 and be transported to the hospital emergency room.  If you develope a fever above 101 F, pus (white drainage) or increased drainage or redness at the  wound, or calf pain, call your surgeon's office.   Constipation Prevention   Complete by: As directed    Drink plenty of fluids.  Prune juice may be helpful.  You may use a stool softener, such as Colace (over the counter) 100 mg twice a day.  Use MiraLax (over the counter) for constipation as needed.   Diet - low sodium heart healthy   Complete by: As directed    Discharge instructions   Complete by: As directed    No lifting greater than 10 lbs. Avoid bending, stooping and twisting. Walk in house  for first week them may start to get out slowly increasing distance up to one mile by 3 weeks post op. Keep incision dry for 3 days, may use tegaderm or similar water impervious dressing.   Driving restrictions   Complete by: As directed    No driving for 2 weeks   Increase activity slowly as tolerated   Complete by: As directed    Lifting restrictions   Complete by: As directed    No lifting for 6 weeks   Post-operative opioid taper instructions:   Complete by: As directed    POST-OPERATIVE OPIOID TAPER INSTRUCTIONS: It is important to wean off of your opioid medication as soon as possible. If you do not need pain medication after your surgery it is ok to stop day one. Opioids include: Codeine, Hydrocodone(Norco, Vicodin), Oxycodone(Percocet, oxycontin) and hydromorphone amongst others.  Long term and even short term use of opiods can cause: Increased pain response Dependence Constipation Depression Respiratory depression And more.  Withdrawal symptoms can include Flu like symptoms Nausea, vomiting And more Techniques to manage these symptoms Hydrate well Eat regular healthy meals Stay active Use relaxation techniques(deep breathing, meditating, yoga) Do Not substitute Alcohol to help with tapering If you have been on opioids for less than two weeks and do not have pain than it is ok to stop all together.  Plan to wean off of opioids This plan should start within one week post op of your joint replacement. Maintain the same interval or time between taking each dose and first decrease the dose.  Cut the total daily intake of opioids by one tablet each day Next start to increase the time between doses. The last dose that should be eliminated is the evening dose.           Follow-up Information     Kerrin Champagne, MD Follow up in 2 week(s).   Specialty: Orthopedic Surgery Why: For wound re-check Contact information: [address] [place]  [address] [phone]                 Discharge Plan:  discharge to home  Disposition:     Signed: Zonia Kief  03/18/2022, 3:02 PM

## 2022-03-26 ENCOUNTER — Other Ambulatory Visit: Payer: Self-pay | Admitting: Specialist

## 2022-03-27 ENCOUNTER — Other Ambulatory Visit: Payer: Self-pay | Admitting: Specialist

## 2022-03-27 NOTE — Telephone Encounter (Signed)
Pt called requesting pain medication and muscle relaxer. Please send to pharmacy on file. Pt phone number is [phone].

## 2022-03-30 ENCOUNTER — Other Ambulatory Visit: Payer: Self-pay | Admitting: Specialist

## 2022-03-30 ENCOUNTER — Other Ambulatory Visit (HOSPITAL_COMMUNITY): Payer: Self-pay

## 2022-03-30 MED ORDER — HYDROCODONE-ACETAMINOPHEN 5-325 MG PO TABS
1.0000 | ORAL_TABLET | Freq: Four times a day (QID) | ORAL | 0 refills | Status: DC | PRN
  Filled 2022-03-30: qty 30, 8d supply, fill #0

## 2022-03-30 MED ORDER — METHOCARBAMOL 500 MG PO TABS
500.0000 mg | ORAL_TABLET | Freq: Four times a day (QID) | ORAL | 1 refills | Status: DC | PRN
Start: 2022-03-30 — End: 2022-04-08
  Filled 2022-03-30: qty 30, 8d supply, fill #0

## 2022-03-31 ENCOUNTER — Other Ambulatory Visit (HOSPITAL_COMMUNITY): Payer: Self-pay

## 2022-04-07 ENCOUNTER — Other Ambulatory Visit (HOSPITAL_COMMUNITY): Payer: Self-pay

## 2022-04-08 ENCOUNTER — Ambulatory Visit (INDEPENDENT_AMBULATORY_CARE_PROVIDER_SITE_OTHER): Payer: BC Managed Care – PPO | Admitting: Specialist

## 2022-04-08 ENCOUNTER — Encounter: Payer: Self-pay | Admitting: Specialist

## 2022-04-08 VITALS — BP 124/89 | HR 62 | Ht 72.0 in | Wt 187.0 lb

## 2022-04-08 DIAGNOSIS — M5416 Radiculopathy, lumbar region: Secondary | ICD-10-CM | POA: Diagnosis not present

## 2022-04-08 DIAGNOSIS — M792 Neuralgia and neuritis, unspecified: Secondary | ICD-10-CM

## 2022-04-08 DIAGNOSIS — Z9889 Other specified postprocedural states: Secondary | ICD-10-CM

## 2022-04-08 MED ORDER — CYCLOBENZAPRINE HCL 10 MG PO TABS: 10.0000 mg | ORAL_TABLET | Freq: Three times a day (TID) | ORAL | 0 refills | Status: DC | PRN

## 2022-04-08 MED ORDER — GABAPENTIN 300 MG PO CAPS: ORAL_CAPSULE | ORAL | 1 refills | Status: DC

## 2022-04-08 NOTE — Patient Instructions (Signed)
Plan: Avoid frequent bending and stooping  No lifting greater than 10 lbs. May use ice or moist heat for pain. Weight loss is of benefit. Best medication for lumbar disc disease is arthritis medications like voltaren. Exercise is important to improve your indurance and does allow people to function better inspite of back pain. Gabapentin for neurogenic pain. Physical Therapy for left leg strengthening and core motor strengthening.

## 2022-04-08 NOTE — Progress Notes (Signed)
   Post-Op Visit Note   Patient: Isaac Fletcher           Date of Birth: 02-05-1981           MRN: [id number] Visit Date: 04/08/2022 PCP: Frederica Kuster, MD   Assessment & Plan:  Chief Complaint:  Chief Complaint  Patient presents with   Lower Back - Routine Post Op   Visit Diagnoses: No diagnosis found. Incision is healed Motor is normal RSLR is negative Reflexes left knee 0 and right 2+  Plan: Avoid frequent bending and stooping  No lifting greater than 10 lbs. May use ice or moist heat for pain. Weight loss is of benefit. Best medication for lumbar disc disease is arthritis medications like voltaren. Exercise is important to improve your indurance and does allow people to function better inspite of back pain. Gabapentin for neurogenic pain. Physical Therapy for left leg strengthening and core motor strengthening.     Follow-Up Instructions: No follow-ups on file.   Orders:  No orders of the defined types were placed in this encounter.  No orders of the defined types were placed in this encounter.   Imaging: No results found.  PMFS History: Patient Active Problem List   Diagnosis Date Noted   Status post lumbar laminectomy 02/16/2022   Status post lumbar microdiscectomy 04/08/2021   Surgery, elective    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   Lumbar foraminal stenosis 02/19/2021   Trochanteric bursitis, left hip 02/19/2021   Past Medical History:  Diagnosis Date   Back pain    Constipation    H/O hernia repair     Family History  Problem Relation Age of Onset   Diabetes Father     Past Surgical History:  Procedure Laterality Date   HERNIA REPAIR     Herniated Disc     LUMBAR LAMINECTOMY N/A 03/21/2021   Procedure: LEFT LEFT FOUR-FIVE MICRODISCECTOMY;  Surgeon: [name], MD;  Location: [facility] OR;  Service: Orthopedics;  Laterality: N/A;   LUMBAR LAMINECTOMY/DECOMPRESSION MICRODISCECTOMY N/A 02/16/2022   Procedure: LEFT FAR  LATERAL APPROACH TO REMOVE LEFT [name] DISC HERNIATION;  Surgeon: Kerrin Champagne, MD;  Location: [facility] OR;  Service: Orthopedics;  Laterality: N/A;   Social History   Occupational History   Not on file  Tobacco Use   Smoking status: Never   Smokeless tobacco: Never  Vaping Use   Vaping Use: Never used  Substance and Sexual Activity   Alcohol use: Yes   Drug use: Never   Sexual activity: Not on file

## 2022-04-22 ENCOUNTER — Other Ambulatory Visit: Payer: Self-pay

## 2022-04-22 ENCOUNTER — Encounter: Payer: Self-pay | Admitting: Rehabilitative and Restorative Service Providers"

## 2022-04-22 ENCOUNTER — Ambulatory Visit (INDEPENDENT_AMBULATORY_CARE_PROVIDER_SITE_OTHER): Payer: BC Managed Care – PPO | Admitting: Rehabilitative and Restorative Service Providers"

## 2022-04-22 DIAGNOSIS — R2689 Other abnormalities of gait and mobility: Secondary | ICD-10-CM

## 2022-04-22 DIAGNOSIS — R293 Abnormal posture: Secondary | ICD-10-CM | POA: Diagnosis not present

## 2022-04-22 DIAGNOSIS — M5459 Other low back pain: Secondary | ICD-10-CM | POA: Diagnosis not present

## 2022-04-22 DIAGNOSIS — M6281 Muscle weakness (generalized): Secondary | ICD-10-CM | POA: Diagnosis not present

## 2022-04-22 NOTE — Therapy (Signed)
OUTPATIENT PHYSICAL THERAPY EVALUATION   Patient Name: Isaac Fletcher MRN: [id number] DOB:[birth year], 41 y.o., male Today's Date: 04/22/2022  End of Session  PT End of Session - 04/22/22 0935     Visit Number 1    Number of Visits 18    Date for PT Re-Evaluation 06/17/22    Authorization Type BCBS    Authorization Time Period $20 COPAY, 30 PT VISITS    PT Start Time [phone]    PT Stop Time 1016    PT Time Calculation (min) 33 min    Activity Tolerance Patient tolerated treatment well    Behavior During Therapy WFL for tasks assessed/performed             Past Medical History:  Diagnosis Date   Back pain    Constipation    H/O hernia repair    Past Surgical History:  Procedure Laterality Date   HERNIA REPAIR     Herniated Disc     LUMBAR LAMINECTOMY N/A 03/21/2021   Procedure: LEFT LEFT FOUR-FIVE MICRODISCECTOMY;  Surgeon: [name], MD;  Location: [facility] OR;  Service: Orthopedics;  Laterality: N/A;   LUMBAR LAMINECTOMY/DECOMPRESSION MICRODISCECTOMY N/A 02/16/2022   Procedure: LEFT FAR LATERAL APPROACH TO REMOVE LEFT [name] DISC HERNIATION;  Surgeon: Kerrin Champagne, MD;  Location: [facility] OR;  Service: Orthopedics;  Laterality: N/A;   Patient Active Problem List   Diagnosis Date Noted   Status post lumbar laminectomy 02/16/2022   Status post lumbar microdiscectomy 04/08/2021   Surgery, elective    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   Lumbar foraminal stenosis 02/19/2021   Trochanteric bursitis, left hip 02/19/2021    PCP: Frederica Kuster, MD  REFERRING PROVIDER: Kerrin Champagne, MD  REFERRING DIAG: [phone] (ICD-10-CM) - S/P lumbar microdiscectomy M54.16 (ICD-10-CM) - Lumbar radiculopathy M79.2 (ICD-10-CM) - Neurogenic pain  Rationale for Evaluation and Treatment Rehabilitation  THERAPY DIAG:  Other low back pain  Muscle weakness (generalized)  Other abnormalities of gait and mobility  Abnormal posture  ONSET DATE: 02/16/22 lumbar  microdiscectomy  SUBJECTIVE:                                                                                                                                                                                           SUBJECTIVE STATEMENT: He is feeling well since the surgery and MD appointment, starting to feel muscle activity returning in the leg. In the morning, his leg feels heavy and he has some reduced sensation in the lateral lower leg that the physician said will return slowly.  PERTINENT HISTORY:  Lumbar microdiscectomy 02/16/2022, 03/21/2021. Hx of LLE weakness and  neurogenic pain Previous PT 09/2021 - 11/2021  PAIN:  Are you having pain? Yes: NPRS scale: 4/10, ranges 0-5/10 Pain location: left low back Pain description: pressure Aggravating factors: standing for extended periods of time Relieving factors: sitting and laying down   PRECAUTIONS: Previous MD note - avoid frequent bending and stooping, no lifting greater than 10 lbs  WEIGHT BEARING RESTRICTIONS No  FALLS:  Has patient fallen in last 6 months? Yes. 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  PLOF: Independent , currently needs help dressing with bending restriction  PATIENT GOALS  correct leg function, lifting something heavy   OBJECTIVE:    PATIENT SURVEYS:  FOTO 04/22/2022 eval 49, predicted 57  SCREENING FOR RED FLAGS: 04/22/2022 Bowel or bladder incontinence: No Cauda equina syndrome: No  COGNITION: 04/22/2022 Overall cognitive status: Within functional limits for tasks assessed     SENSATION: Not tested  MUSCLE LENGTH: 04/22/2022 - none performed today due to time Hamstrings:  Thomas test:   POSTURE:  8/16/2023Standing - anterior pelvic  tilt and weight shift right  LUMBAR ROM:   Active  AROM  04/22/2022  Flexion   Extension 75%  Right lateral flexion To lateral knee, some pain in Lt low back  Left lateral flexion To lateral knee, some pain in Lt low back  Right rotation   Left rotation    (Blank rows = not tested)  LOWER EXTREMITY ROM:     ROM (A: AROM, P: PROM)  Right 04/22/2022 Left 04/22/2022  Hip flexion    Hip extension    Hip abduction    Hip adduction    Hip internal rotation    Hip external rotation    Knee flexion    Knee extension    Ankle dorsiflexion    Ankle plantarflexion    Ankle inversion    Ankle eversion     (Blank rows = not tested)  LOWER EXTREMITY MMT:    MMT Right Eval 04/22/22 seated Left Eval 04/22/22 seated  Hip flexion 4/5 3+/5  Hip extension    Hip abduction    Hip adduction    Hip internal rotation    Hip external rotation    Knee flexion 4/5 4/5  Knee extension 4/5 3+/5  Ankle dorsiflexion 4/5 5/5  Ankle plantarflexion    Ankle inversion    Ankle eversion     (Blank rows = not tested)  FUNCTIONAL TESTS:  04/22/2022 Sit to/from stand completed without UE support and displayed weight shift Rt throughout movement  GAIT: 04/22/2022 Distance walked: 100' Assistive device utilized: None Level of assistance: Complete Independence Comments: antalgic pattern with reduced stance time on Lt   TODAY'S TREATMENT  04/22/22 Reviewed HEP below, patient performed trial reps x 3 for comprehension with verbal, visual, and tactile cueing for technique. Handout provided in [place].  PATIENT EDUCATION:  04/22/2022 Education details: [email or web address] Person educated: Patient Education method: Explanation, Demonstration, Tactile cues, Verbal cues, and Handouts Education comprehension: verbalized understanding, returned demonstration, verbal cues required, tactile cues required, and needs further education   HOME EXERCISE PROGRAM: Access Code: [email or web address] URL:  [email or web address] Date: 04/22/2022 Prepared by: Chyrel Masson  Exercises - Seated Multifidi Isometric  - 1-2 x daily - 7 x weekly - 1 sets - 10  reps - 5-10 hold - Seated Straight Leg Heel Taps  - 1-2 x daily - 7 x weekly - 1-2 sets - 10-15 reps - 2-3 hold - Seated Abdominal Press into [employer]  - 1-2 x daily - 7 x weekly - 1 sets - 10 reps - 5-10 hold - Sit to Stand  - 1-2 x daily - 7 x weekly - 2-3 sets - 10-15 reps - Seated March with Opposite Arm Flexion on Swiss Ball  - 1-2 x daily - 7 x weekly - 1-2 sets - 10-15 reps  ASSESSMENT:  CLINICAL IMPRESSION: Patient is a 41 y.o. male who was seen today for physical therapy evaluation and treatment for s/p lumbar surgery 02/16/2022. He presents with pain in the Lt lumbar region, bil LE weakness with noted Lt sided quad and hip flexor weakness, and reduced lumbar extension mobility that impacts his functional activities and gait. He will benefit from skilled PT to address observed impairments in order to progress towards meeting his goals.   OBJECTIVE IMPAIRMENTS Abnormal gait, decreased activity tolerance, decreased mobility, decreased ROM, decreased strength, impaired sensation, and pain.   ACTIVITY LIMITATIONS carrying, lifting, bending, sitting, standing, squatting, stairs, and dressing  PARTICIPATION LIMITATIONS: community activity and occupation  PERSONAL FACTORS Time since onset of injury/illness/exacerbation and 1 comorbidity: see PMH  are also affecting patient's functional outcome.   REHAB POTENTIAL: Good  CLINICAL DECISION MAKING: Stable/uncomplicated  EVALUATION COMPLEXITY: Low   GOALS: Goals reviewed with patient? Yes  SHORT TERM GOALS: Target date: 05/13/2022  Patient will be independent with HEP to continue progressing outside of clinic. Baseline: see objective data Goal status: INITIAL   LONG TERM GOALS: Target date: 06/17/2022  Patient will score FOTO >/= 57% to reflect improved  self-perceived functional ability Baseline: see objective data Goal status: INITIAL   2.  Patient will be independent with a HEP to maintain and progress functional gains from skilled PT. Baseline: see objective data Goal status: INITIAL   3.  Patient will report </= 2/10 pain with sitting, standing, and walking activities. Baseline: see objective data Goal status: INITIAL   4. Patient presents with lumbar extension WNL without increases in pain intensity or peripheralization to allow for upright posture in sitting and standing, promoting normal functional movement ability. Baseline: see objective data Goal status: INITIAL  5.  Patient has 5/5 MMT bilateral hip and knee strength to represent appropriate strength needed for functional activities including community activity, occupation, and household requirements. Baseline: see objective data Goal status: INITIAL  6.  Patient verbalizes and demonstrates ability to perform duties for occupation and ADLs, including prolonged standing, lifting, and dressing. Baseline: see objective data Goal status: INITIAL   PLAN: PT FREQUENCY: 1-2x/week  PT DURATION: 8 weeks  PLANNED INTERVENTIONS: Therapeutic exercises, Therapeutic activity, Neuromuscular re-education, Balance training, Gait training, Patient/Family education, Self Care, Joint mobilization, Stair training, Aquatic Therapy, Dry Needling, Electrical stimulation, Cryotherapy, Moist heat, Ultrasound, Ionotophoresis 4mg /ml Dexamethasone, Manual therapy, Re-evaluation, and physical performance tests .  PLAN FOR NEXT SESSION: review HEP, assess hamstring and hip flexor tightness, 5xSTS, glute strength as indicated. Progressive trunk, hip, and quad strengthening  , Student-PT 04/22/2022, 10:57 AM  This entire session of physical therapy was performed under the direct supervision of PT signing evaluation /treatment. PT reviewed note and agrees.  04/24/2022, PT, DPT, OCS,  ATC 04/22/2022, 10:56 AM

## 2022-04-27 NOTE — Therapy (Unsigned)
OUTPATIENT PHYSICAL THERAPY EVALUATION   Patient Name: Isaac Fletcher MRN: [id number] DOB:08-05-1981, 41 y.o., male Today's Date: 04/28/2022  End of Session  PT End of Session - 04/28/22 0939     Visit Number 2    Number of Visits 18    Date for PT Re-Evaluation 06/17/22    Authorization Type BCBS    Authorization Time Period $20 COPAY, 30 PT VISITS    PT Start Time 0845    PT Stop Time 0933    PT Time Calculation (min) 48 min    Activity Tolerance Patient limited by pain    Behavior During Therapy WFL for tasks assessed/performed              Past Medical History:  Diagnosis Date   Back pain    Constipation    H/O hernia repair    Past Surgical History:  Procedure Laterality Date   HERNIA REPAIR     Herniated Disc     LUMBAR LAMINECTOMY N/A 03/21/2021   Procedure: LEFT LEFT FOUR-FIVE MICRODISCECTOMY;  Surgeon: [name], MD;  Location: [facility];  Service: Orthopedics;  Laterality: N/A;   LUMBAR LAMINECTOMY/DECOMPRESSION MICRODISCECTOMY N/A 02/16/2022   Procedure: LEFT FAR LATERAL APPROACH TO REMOVE LEFT [name] DISC HERNIATION;  Surgeon: Kerrin Champagne, MD;  Location: [facility];  Service: Orthopedics;  Laterality: N/A;   Patient Active Problem List   Diagnosis Date Noted   Status post lumbar laminectomy 02/16/2022   Status post lumbar microdiscectomy 04/08/2021   Surgery, elective    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   Lumbar foraminal stenosis 02/19/2021   Trochanteric bursitis, left hip 02/19/2021    PCP: Frederica Kuster, MD  REFERRING PROVIDER: Kerrin Champagne, MD  REFERRING DIAG: [phone] (ICD-10-CM) - S/P lumbar microdiscectomy M54.16 (ICD-10-CM) - Lumbar radiculopathy M79.2 (ICD-10-CM) - Neurogenic pain  Rationale for Evaluation and Treatment Rehabilitation  THERAPY DIAG:  Muscle weakness (generalized)  Other low back pain  Other abnormalities of gait and mobility  Abnormal posture  Chronic low back pain without sciatica,  unspecified back pain laterality  ONSET DATE: 02/16/22 lumbar microdiscectomy  SUBJECTIVE:                                                                                                                                                                                           SUBJECTIVE STATEMENT: Pt states that he is feeling good. He reports a "compression" pain in his L LE with prolonged standing while working.   PERTINENT HISTORY:  Lumbar microdiscectomy 02/16/2022, 03/21/2021. Hx of LLE weakness and neurogenic pain Previous PT 09/2021 - 11/2021  PAIN:  Are you having pain? Yes: NPRS scale: 4/10, ranges 0-5/10 Pain location: left low back Pain description: pressure Aggravating factors: standing for extended periods of time Relieving factors: sitting and laying down   PRECAUTIONS: Previous MD note - avoid frequent bending and stooping, no lifting greater than 10 lbs  WEIGHT BEARING RESTRICTIONS No  FALLS:  Has patient fallen in last 6 months? Yes. 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  PLOF: Independent , currently needs help dressing with bending restriction  PATIENT GOALS  correct leg function, lifting something heavy   OBJECTIVE:    PATIENT SURVEYS:  FOTO 04/22/2022 eval 49, predicted 57  SCREENING FOR RED FLAGS: 04/22/2022 Bowel or bladder incontinence: No Cauda equina syndrome: No  COGNITION: 04/22/2022 Overall cognitive status: Within functional limits for tasks assessed     SENSATION: Not tested  MUSCLE LENGTH: 04/22/2022 - none performed today due to time Hamstrings:  Thomas test:   POSTURE:  8/16/2023Standing - anterior pelvic tilt and weight shift right  LUMBAR ROM:   Active  AROM  04/22/2022   Flexion   Extension 75%  Right lateral flexion To lateral knee, some pain in Lt low back  Left lateral flexion To lateral knee, some pain in Lt low back  Right rotation   Left rotation    (Blank rows = not tested)  LOWER EXTREMITY ROM:     ROM (A: AROM, P: PROM)  Right 04/22/2022 Left 04/22/2022  Hip flexion    Hip extension    Hip abduction    Hip adduction    Hip internal rotation    Hip external rotation    Knee flexion    Knee extension    Ankle dorsiflexion    Ankle plantarflexion    Ankle inversion    Ankle eversion     (Blank rows = not tested)  LOWER EXTREMITY MMT:    MMT Right Eval 04/22/22 seated Left Eval 04/22/22 seated  Hip flexion 4/5 3+/5  Hip extension    Hip abduction    Hip adduction    Hip internal rotation    Hip external rotation    Knee flexion 4/5 4/5  Knee extension 4/5 3+/5  Ankle dorsiflexion 4/5 5/5  Ankle plantarflexion    Ankle inversion    Ankle eversion     (Blank rows = not tested)  FUNCTIONAL TESTS:  04/22/2022 Sit to/from stand completed without UE support and displayed weight shift Rt throughout movement  GAIT: 04/22/2022 Distance walked: 100' Assistive device utilized: None Level of assistance: Complete Independence Comments: antalgic pattern with reduced stance time on Lt   TODAY'S TREATMENT  04/28/2022:  Nustep 5 min lvl 6 while taking subjective.  Lower trunk rotation x20 Seated abdominal press down for TA activation 2x 10 5 sec hold Modified thomas stretch on L LE 2x 30sec hold.  Supine SLR 2x10 BIL Physioball roll outs 3 ways 10x each direction.   STM to L lower back due to reported pain. No tension noted.  Moist heat to lower back for 8 min at end of session.   04/22/22 Reviewed HEP below, patient performed trial reps x 3 for comprehension with verbal, visual, and tactile cueing for technique. Handout provided in [place].  PATIENT EDUCATION:  04/22/2022 Education  details: [id number] Person educated:  Patient Education method: Explanation, Demonstration, Tactile cues, Verbal cues, and Handouts Education comprehension: verbalized understanding, returned demonstration, verbal cues required, tactile cues required, and needs further education   HOME EXERCISE PROGRAM: Access Code: [email or web address] URL: [email or web address] Date: 04/22/2022 Prepared by: Scot Jun  Exercises - Seated Multifidi Isometric  - 1-2 x daily - 7 x weekly - 1 sets - 10 reps - 5-10 hold - Seated Straight Leg Heel Taps  - 1-2 x daily - 7 x weekly - 1-2 sets - 10-15 reps - 2-3 hold - Seated Abdominal Press into [employer]  - 1-2 x daily - 7 x weekly - 1 sets - 10 reps - 5-10 hold - Sit to Stand  - 1-2 x daily - 7 x weekly - 2-3 sets - 10-15 reps - Seated March with Opposite Arm Flexion on Swiss Ball  - 1-2 x daily - 7 x weekly - 1-2 sets - 10-15 reps  ASSESSMENT:  CLINICAL IMPRESSION: Patient presents to first follow up appt with persistent symptoms in his L LE. Session initiated on Nustep for increased mobility and peripheral blood flow while taking subjective and organizing interpreter services. Remainder of session with focus on pain modulation, lumbar mobility and LE strengthening. Despite interpreter services, pt has the inability to describe pain anything other than "pain". Tried to explain to pt the difference between pain, fatigue and tension with minimal carryover. Pt tolerated all prescribed exercises fairly with continued "pain". STM performed to lower back at surgical site and along multifidi with no tension noted. Will continue to progress pt with proximal hip strengthening next session. Pt will continue to benefit from skilled PT to address continued deficits.    OBJECTIVE IMPAIRMENTS Abnormal gait, decreased activity tolerance, decreased mobility, decreased ROM, decreased strength, impaired sensation, and pain.   ACTIVITY LIMITATIONS carrying, lifting, bending, sitting, standing, squatting,  stairs, and dressing  PARTICIPATION LIMITATIONS: community activity and occupation  PERSONAL FACTORS Time since onset of injury/illness/exacerbation and 1 comorbidity: see PMH  are also affecting patient's functional outcome.   REHAB POTENTIAL: Good  CLINICAL DECISION MAKING: Stable/uncomplicated  EVALUATION COMPLEXITY: Low   GOALS: Goals reviewed with patient? Yes  SHORT TERM GOALS: Target date: 05/13/2022  Patient will be independent with HEP to continue progressing outside of clinic. Baseline: see objective data Goal status: INITIAL   LONG TERM GOALS: Target date: 06/17/2022  Patient will score FOTO >/= 57% to reflect improved self-perceived functional ability Baseline: see objective data Goal status: INITIAL   2.  Patient will be independent with a HEP to maintain and progress functional gains from skilled PT. Baseline: see objective data Goal status: INITIAL   3.  Patient will report </= 2/10 pain with sitting, standing, and walking activities. Baseline: see objective data Goal status: INITIAL   4. Patient presents with lumbar extension WNL without increases in pain intensity or peripheralization to allow for upright posture in sitting and standing, promoting normal functional movement ability. Baseline: see objective data Goal status: INITIAL  5.  Patient has 5/5 MMT bilateral hip and knee strength to represent appropriate strength needed for functional activities including community activity, occupation, and household requirements. Baseline: see objective data Goal status: INITIAL  6.  Patient verbalizes and demonstrates ability to perform duties for occupation and ADLs, including prolonged standing, lifting, and dressing. Baseline: see objective data Goal status: INITIAL   PLAN: PT FREQUENCY: 1-2x/week  PT DURATION: 8 weeks  PLANNED INTERVENTIONS: Therapeutic exercises, Therapeutic activity, Neuromuscular re-education, Balance training,  Gait training,  Patient/Family education, Self Care, Joint mobilization, Stair training, Aquatic Therapy, Dry Needling, Electrical stimulation, Cryotherapy, Moist heat, Ultrasound, Ionotophoresis 4mg /ml Dexamethasone, Manual therapy, Re-evaluation, and physical performance tests .  PLAN FOR NEXT SESSION: review HEP, assess hamstring and hip flexor tightness, 5xSTS, glute strength as indicated. Progressive trunk, hip, and quad strengthening.   PT, DPT 04/28/22  9:41 AM

## 2022-04-28 ENCOUNTER — Encounter: Payer: Self-pay | Admitting: Physical Therapy

## 2022-04-28 ENCOUNTER — Ambulatory Visit (INDEPENDENT_AMBULATORY_CARE_PROVIDER_SITE_OTHER): Payer: BC Managed Care – PPO | Admitting: Physical Therapy

## 2022-04-28 DIAGNOSIS — M545 Low back pain, unspecified: Secondary | ICD-10-CM

## 2022-04-28 DIAGNOSIS — R2689 Other abnormalities of gait and mobility: Secondary | ICD-10-CM

## 2022-04-28 DIAGNOSIS — R293 Abnormal posture: Secondary | ICD-10-CM | POA: Diagnosis not present

## 2022-04-28 DIAGNOSIS — M6281 Muscle weakness (generalized): Secondary | ICD-10-CM | POA: Diagnosis not present

## 2022-04-28 DIAGNOSIS — G8929 Other chronic pain: Secondary | ICD-10-CM

## 2022-04-28 DIAGNOSIS — M5459 Other low back pain: Secondary | ICD-10-CM

## 2022-05-04 NOTE — Therapy (Unsigned)
OUTPATIENT PHYSICAL THERAPY EVALUATION   Patient Name: Isaac Fletcher MRN: [id number] DOB:[birth year], 41 y.o., male Today's Date: 05/04/2022  End of Session     Past Medical History:  Diagnosis Date   Back pain    Constipation    H/O hernia repair    Past Surgical History:  Procedure Laterality Date   HERNIA REPAIR     Herniated Disc     LUMBAR LAMINECTOMY N/A 03/21/2021   Procedure: LEFT LEFT FOUR-FIVE MICRODISCECTOMY;  Surgeon: [name], MD;  Location: [facility] OR;  Service: Orthopedics;  Laterality: N/A;   LUMBAR LAMINECTOMY/DECOMPRESSION MICRODISCECTOMY N/A 02/16/2022   Procedure: LEFT FAR LATERAL APPROACH TO REMOVE LEFT [name] DISC HERNIATION;  Surgeon: Kerrin Champagne, MD;  Location: [facility] OR;  Service: Orthopedics;  Laterality: N/A;   Patient Active Problem List   Diagnosis Date Noted   Status post lumbar laminectomy 02/16/2022   Status post lumbar microdiscectomy 04/08/2021   Surgery, elective    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   Lumbar foraminal stenosis 02/19/2021   Trochanteric bursitis, left hip 02/19/2021    PCP: Frederica Kuster, MD  REFERRING PROVIDER: Kerrin Champagne, MD  REFERRING DIAG: [phone] (ICD-10-CM) - S/P lumbar microdiscectomy M54.16 (ICD-10-CM) - Lumbar radiculopathy M79.2 (ICD-10-CM) - Neurogenic pain  Rationale for Evaluation and Treatment Rehabilitation  THERAPY DIAG:  No diagnosis found.  ONSET DATE: 02/16/22 lumbar microdiscectomy  SUBJECTIVE:                                                                                                                                                                                           SUBJECTIVE STATEMENT: Pt states that he is feeling good. He reports a "compression" pain in his L LE with prolonged standing while working.   PERTINENT HISTORY:  Lumbar microdiscectomy 02/16/2022, 03/21/2021. Hx of LLE weakness and neurogenic pain Previous PT 09/2021 - 11/2021  PAIN:  Are you  having pain? Yes: NPRS scale: 4/10, ranges 0-5/10 Pain location: left low back Pain description: pressure Aggravating factors: standing for extended periods of time Relieving factors: sitting and laying down   PRECAUTIONS: Previous MD note - avoid frequent bending and stooping, no lifting greater than 10 lbs  WEIGHT BEARING RESTRICTIONS No  FALLS:  Has patient fallen in last 6 months? Yes. 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  PLOF: Independent , currently needs help dressing  with bending restriction  PATIENT GOALS  correct leg function, lifting something heavy   OBJECTIVE:    PATIENT SURVEYS:  FOTO 04/22/2022 eval 49, predicted 57  SCREENING FOR RED FLAGS: 04/22/2022 Bowel or bladder incontinence: No Cauda equina syndrome: No  COGNITION: 04/22/2022 Overall cognitive status: Within functional limits for tasks assessed     SENSATION: Not tested  MUSCLE LENGTH: 04/22/2022 - none performed today due to time Hamstrings:  Thomas test:   POSTURE:  8/16/2023Standing - anterior pelvic tilt and weight shift right  LUMBAR ROM:   Active  AROM  04/22/2022  Flexion   Extension 75%  Right lateral flexion To lateral knee, some pain in Lt low back  Left lateral flexion To lateral knee, some pain in Lt low back  Right rotation   Left rotation    (Blank rows = not tested)  LOWER EXTREMITY ROM:     ROM (A: AROM, P: PROM)  Right 04/22/2022 Left 04/22/2022  Hip flexion    Hip extension    Hip abduction    Hip adduction    Hip internal rotation    Hip external rotation    Knee flexion    Knee extension    Ankle dorsiflexion    Ankle plantarflexion    Ankle inversion    Ankle eversion     (Blank rows = not  tested)  LOWER EXTREMITY MMT:    MMT Right Eval 04/22/22 seated Left Eval 04/22/22 seated  Hip flexion 4/5 3+/5  Hip extension    Hip abduction    Hip adduction    Hip internal rotation    Hip external rotation    Knee flexion 4/5 4/5  Knee extension 4/5 3+/5  Ankle dorsiflexion 4/5 5/5  Ankle plantarflexion    Ankle inversion    Ankle eversion     (Blank rows = not tested)  FUNCTIONAL TESTS:  04/22/2022 Sit to/from stand completed without UE support and displayed weight shift Rt throughout movement  GAIT: 04/22/2022 Distance walked: 100' Assistive device utilized: None Level of assistance: Complete Independence Comments: antalgic pattern with reduced stance time on Lt   TODAY'S TREATMENT  04/28/2022:  Nustep 5 min lvl 6 while taking subjective.  Lower trunk rotation x20 Seated abdominal press down for TA activation 2x 10 5 sec hold Modified thomas stretch on L LE 2x 30sec hold.  Supine SLR 2x10 BIL Physioball roll outs 3 ways 10x each direction.   STM to L lower back due to reported pain. No tension noted.  Moist heat to lower back for 8 min at end of session.   04/22/22 Reviewed HEP below, patient performed trial reps x 3 for comprehension with verbal, visual, and tactile cueing for technique. Handout provided in [place].  PATIENT EDUCATION:  04/22/2022 Education details: [email or web address] Person educated: Patient Education method: Explanation, Demonstration, Tactile cues, Verbal cues, and Handouts Education comprehension: verbalized understanding, returned demonstration, verbal cues required, tactile cues required, and needs further education   HOME EXERCISE PROGRAM: Access Code: [email or web address] URL: [email or web address] Date: 04/22/2022 Prepared by: Chyrel Masson  Exercises - Seated Multifidi Isometric  - 1-2 x daily - 7 x weekly - 1 sets - 10 reps - 5-10 hold - Seated Straight Leg Heel Taps  - 1-2 x daily - 7 x weekly - 1-2 sets - 10-15 reps - 2-3  hold - Seated Abdominal Press into [employer]  - 1-2 x daily - 7 x weekly - 1 sets - 10 reps - 5-10 hold - Sit to  Stand  - 1-2 x daily - 7 x weekly - 2-3 sets - 10-15 reps - Seated March with Opposite Arm Flexion on Swiss Ball  - 1-2 x daily - 7 x weekly - 1-2 sets - 10-15 reps  ASSESSMENT:  CLINICAL IMPRESSION: Patient presents to first follow up appt with persistent symptoms in his L LE. Session initiated on Nustep for increased mobility and peripheral blood flow while taking subjective and organizing interpreter services. Remainder of session with focus on pain modulation, lumbar mobility and LE strengthening. Despite interpreter services, pt has the inability to describe pain anything other than "pain". Tried to explain to pt the difference between pain, fatigue and tension with minimal carryover. Pt tolerated all prescribed exercises fairly with continued "pain". STM performed to lower back at surgical site and along multifidi with no tension noted. Will continue to progress pt with proximal hip strengthening next session. Pt will continue to benefit from skilled PT to address continued deficits.    OBJECTIVE IMPAIRMENTS Abnormal gait, decreased activity tolerance, decreased mobility, decreased ROM, decreased strength, impaired sensation, and pain.   ACTIVITY LIMITATIONS carrying, lifting, bending, sitting, standing, squatting, stairs, and dressing  PARTICIPATION LIMITATIONS: community activity and occupation  PERSONAL FACTORS Time since onset of injury/illness/exacerbation and 1 comorbidity: see PMH  are also affecting patient's functional outcome.   REHAB POTENTIAL: Good  CLINICAL DECISION MAKING: Stable/uncomplicated  EVALUATION COMPLEXITY: Low   GOALS: Goals reviewed with patient? Yes  SHORT TERM GOALS: Target date: 05/13/2022  Patient will be independent with HEP to continue progressing outside of clinic. Baseline: see objective data Goal status: INITIAL   LONG TERM GOALS:  Target date: 06/17/2022  Patient will score FOTO >/= 57% to reflect improved self-perceived functional ability Baseline: see objective data Goal status: INITIAL   2.  Patient will be independent with a HEP to maintain and progress functional gains from skilled PT. Baseline: see objective data Goal status: INITIAL   3.  Patient will report </= 2/10 pain with sitting, standing, and walking activities. Baseline: see objective data Goal status: INITIAL   4. Patient presents with lumbar extension WNL without increases in pain intensity or peripheralization to allow for upright posture in sitting and standing, promoting normal functional movement ability. Baseline: see objective data Goal status: INITIAL  5.  Patient has 5/5 MMT bilateral hip and knee strength to represent appropriate strength needed for functional activities including community activity, occupation, and household requirements. Baseline: see objective data Goal status: INITIAL  6.  Patient verbalizes and demonstrates ability to perform duties for occupation and ADLs, including prolonged standing, lifting, and dressing. Baseline: see objective data Goal status: INITIAL   PLAN: PT FREQUENCY: 1-2x/week  PT DURATION: 8 weeks  PLANNED INTERVENTIONS: Therapeutic exercises, Therapeutic activity, Neuromuscular re-education, Balance training, Gait training, Patient/Family education, Self Care, Joint mobilization, Stair training, Aquatic Therapy, Dry Needling, Electrical stimulation, Cryotherapy, Moist heat, Ultrasound, Ionotophoresis 4mg /ml Dexamethasone, Manual therapy, Re-evaluation, and physical performance tests .  PLAN FOR NEXT SESSION: review HEP, assess hamstring and hip flexor tightness, 5xSTS, glute strength as indicated. Progressive trunk, hip, and quad strengthening.   PT, DPT 05/04/22  9:10 AM

## 2022-05-05 ENCOUNTER — Encounter: Payer: Self-pay | Admitting: Physical Therapy

## 2022-05-05 ENCOUNTER — Ambulatory Visit (INDEPENDENT_AMBULATORY_CARE_PROVIDER_SITE_OTHER): Payer: BC Managed Care – PPO | Admitting: Physical Therapy

## 2022-05-05 DIAGNOSIS — R2689 Other abnormalities of gait and mobility: Secondary | ICD-10-CM

## 2022-05-05 DIAGNOSIS — G8929 Other chronic pain: Secondary | ICD-10-CM

## 2022-05-05 DIAGNOSIS — R293 Abnormal posture: Secondary | ICD-10-CM | POA: Diagnosis not present

## 2022-05-05 DIAGNOSIS — M545 Low back pain, unspecified: Secondary | ICD-10-CM

## 2022-05-05 DIAGNOSIS — M6281 Muscle weakness (generalized): Secondary | ICD-10-CM | POA: Diagnosis not present

## 2022-05-05 DIAGNOSIS — M5459 Other low back pain: Secondary | ICD-10-CM | POA: Diagnosis not present

## 2022-05-05 NOTE — Congregational Nurse Program (Signed)
  Dept: [phone]   Congregational Nurse Program Note  Date of Encounter: 05/05/2022  Past Medical History: Past Medical History:  Diagnosis Date   Back pain    Constipation    H/O hernia repair     Encounter Details:  CNP Questionnaire - 05/05/22 1634       Questionnaire   Do you give verbal consent to treat you today? Yes    Location Patient Served  Scientist, research (life sciences) or Organization    Patient Status Immigrant    Engineer, building services or [place] Insurance    Insurance Referral [facility] Financial Assistance    Medication N/A    Medical Provider Yes    Screening Referrals N/A    Medical Referral Other    Medical Appointment Made Other    Food N/A    Transportation N/A    Housing/Utilities N/A    Interpersonal Safety N/A    Intervention Navigate Healthcare System;Counsel    ED Visit Averted N/A    Life-Saving Intervention Made N/A            patient assisted in calling [place] financial call center to dispute remaining balance, call center will call patient back with a Patent attorney in 24 to 48 hrs. Patient referred to pro bono PT clinic for further assistance in PT.

## 2022-05-08 ENCOUNTER — Ambulatory Visit (INDEPENDENT_AMBULATORY_CARE_PROVIDER_SITE_OTHER): Payer: BC Managed Care – PPO | Admitting: Rehabilitative and Restorative Service Providers"

## 2022-05-08 ENCOUNTER — Encounter: Payer: Self-pay | Admitting: Rehabilitative and Restorative Service Providers"

## 2022-05-08 DIAGNOSIS — R293 Abnormal posture: Secondary | ICD-10-CM

## 2022-05-08 DIAGNOSIS — M6281 Muscle weakness (generalized): Secondary | ICD-10-CM | POA: Diagnosis not present

## 2022-05-08 DIAGNOSIS — M5459 Other low back pain: Secondary | ICD-10-CM | POA: Diagnosis not present

## 2022-05-08 DIAGNOSIS — R2689 Other abnormalities of gait and mobility: Secondary | ICD-10-CM

## 2022-05-08 NOTE — Therapy (Signed)
OUTPATIENT PHYSICAL THERAPY TREATMENT   Patient Name: Isaac Fletcher MRN: [id number] DOB:[birth year], 41 y.o., male Today's Date: 05/08/2022  End of Session  PT End of Session - 05/08/22 0933     Visit Number 4    Number of Visits 18    Date for PT Re-Evaluation 06/17/22    Authorization Type BCBS    Authorization Time Period $20 COPAY, 30 PT VISITS    PT Start Time 0935    PT Stop Time 1029    PT Time Calculation (min) 54 min    Activity Tolerance Patient tolerated treatment well    Behavior During Therapy WFL for tasks assessed/performed             Past Medical History:  Diagnosis Date   Back pain    Constipation    H/O hernia repair    Past Surgical History:  Procedure Laterality Date   HERNIA REPAIR     Herniated Disc     LUMBAR LAMINECTOMY N/A 03/21/2021   Procedure: LEFT LEFT FOUR-FIVE MICRODISCECTOMY;  Surgeon: [name], MD;  Location: [facility] OR;  Service: Orthopedics;  Laterality: N/A;   LUMBAR LAMINECTOMY/DECOMPRESSION MICRODISCECTOMY N/A 02/16/2022   Procedure: LEFT FAR LATERAL APPROACH TO REMOVE LEFT [name] DISC HERNIATION;  Surgeon: Kerrin Champagne, MD;  Location: [facility] OR;  Service: Orthopedics;  Laterality: N/A;   Patient Active Problem List   Diagnosis Date Noted   Status post lumbar laminectomy 02/16/2022   Status post lumbar microdiscectomy 04/08/2021   Surgery, elective    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   Lumbar foraminal stenosis 02/19/2021   Trochanteric bursitis, left hip 02/19/2021    PCP: Frederica Kuster, MD  REFERRING PROVIDER: Kerrin Champagne, MD  REFERRING DIAG: [phone] (ICD-10-CM) - S/P lumbar microdiscectomy M54.16 (ICD-10-CM) - Lumbar radiculopathy M79.2 (ICD-10-CM) - Neurogenic pain  Rationale for Evaluation and Treatment Rehabilitation  THERAPY DIAG:  Muscle weakness (generalized)  Other low back pain  Other abnormalities of gait and mobility  Abnormal posture  ONSET DATE: 02/16/22 lumbar  microdiscectomy  SUBJECTIVE:                                                                                                                                                                                           SUBJECTIVE STATEMENT: He woke up today reporting high low back pain that is highest in the mornings. He has access to a bike and used it once with some leg heaviness after, and he will bring a picture of the bike next session. French-English interpreter present.  PERTINENT HISTORY:  Lumbar microdiscectomy 02/16/2022, 03/21/2021. Hx of LLE weakness and  neurogenic pain Previous PT 09/2021 - 11/2021  PAIN:  Are you having pain? Yes: NPRS scale: 5/10, highest to 7-8/10 Pain location: left low back Pain description: pressure Aggravating factors: standing for extended periods of time, highest pain in mornings Relieving factors: sleeping on his back, movement  Are you having pain? Yes: NPRS scale: 4/10, highest to 8/10 Pain location: Lt knee Pain description: pressure Aggravating factors: standing for extended periods of time Relieving factors: sitting and laying down   PRECAUTIONS: Previous MD note - avoid frequent bending and stooping, no lifting greater than 10 lbs  WEIGHT BEARING RESTRICTIONS No  FALLS:  Has patient fallen in last 6 months? Yes. 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  PLOF: Independent , currently needs help dressing with bending restriction  PATIENT GOALS  correct leg function, lifting something heavy   OBJECTIVE:    PATIENT SURVEYS:  FOTO 04/22/2022 eval 49, predicted 57  SCREENING FOR RED FLAGS: 04/22/2022 Bowel or bladder incontinence: No Cauda equina syndrome:  No  COGNITION: 04/22/2022 Overall cognitive status: Within functional limits for tasks assessed     SENSATION: Not tested  MUSCLE LENGTH: 04/22/2022 - none performed today due to time Hamstrings:  Thomas test:   POSTURE:  04/22/2022 Standing - anterior pelvic tilt and weight shift right  LUMBAR ROM:   Active  AROM  04/22/2022 AROM 05/08/2022  Flexion  To mid shin with some pain  Extension 75% WNL with no pain  Right lateral flexion To lateral knee, some pain in Lt low back To lateral knee, some pain in Lt low back  Left lateral flexion To lateral knee, some pain in Lt low back To lateral mid thigh, some pain in Lt low back  Right rotation    Left rotation     (Blank rows = not tested)  LOWER EXTREMITY ROM:     ROM (A: AROM, P: PROM)  Right 04/22/2022 Left 04/22/2022  Hip flexion    Hip extension    Hip abduction    Hip adduction    Hip internal rotation    Hip external rotation    Knee flexion    Knee extension    Ankle dorsiflexion    Ankle plantarflexion    Ankle inversion    Ankle eversion     (Blank rows = not tested)  LOWER EXTREMITY MMT:    MMT Right Eval 04/22/22 seated Left Eval 04/22/22 seated Left 05/08/22  Hip flexion 4/5 3+/5 4/5  Hip extension     Hip abduction     Hip adduction     Hip internal rotation     Hip external rotation     Knee flexion 4/5 4/5   Knee extension 4/5 3+/5 4/5  Ankle dorsiflexion 4/5 5/5   Ankle plantarflexion     Ankle inversion     Ankle eversion      (Blank rows = not tested)  FUNCTIONAL TESTS:  04/22/2022 Sit to/from stand completed without UE support and displayed weight shift Rt throughout movement  GAIT: 04/22/2022 Distance walked: 100' Assistive device utilized: None Level of assistance: Complete Independence Comments: antalgic pattern with reduced stance time on Lt   TODAY'S TREATMENT  05/08/2022: Therex: Scifit bike seat 10 level 1 BLEs 6 min  Standing lumbar extension x 6 Leg press 45* BLE 100# x  15, LLE 56# 2 x 15, cues for controlled and slowed eccentric Squats to chair height with 10lb weight x 8 with increased knee pain, STS to chair with foam pad with 10lb weight x 8  6" anterior step ups LLE x 15 with verbal and tactile cues for knee extension in stance 6" lateral step ups LLE x 15 with cueing for knee extension in stance and controlled eccentric with heel tap Physioball roll outs 3 ways 10x each direction HEP updated, printed in English  Moist heat to lower back in prone for 8 min at end of session.   05/05/2022:  Nustep 5 min lvl 6 while taking subjective.  Lower trunk rotation x20 Modified thomas stretch on L LE 2x 30sec hold.  Dead bug supine with alternating UE/ LE until fatigue X2 Supine SLR 2x10 BIL Leg press BIL 50#  x20,  then LLE 50#, R LE #50 x15 each.  Physioball roll outs 3 ways 10x each direction.  Step up's to 6" step 15x each side.  Moist heat to lower back for 8 min at end of session.   04/28/2022:  Nustep 5 min lvl 6 while taking subjective.  Lower trunk rotation x20 Seated abdominal press down for TA activation 2x 10 5 sec hold Modified thomas stretch on L LE 2x 30sec hold.  Supine SLR 2x10 BIL Physioball roll outs 3 ways 10x each direction.   STM to L lower back due to reported pain. No tension noted.  Moist heat to lower back for 8 min at end of session.    PATIENT EDUCATION:  04/22/2022 Education details: [email or web address] Person educated: Patient Education method: Explanation, Demonstration, Tactile cues, Verbal cues, and Handouts Education comprehension: verbalized understanding, returned demonstration, verbal cues required, tactile cues required, and needs further education   HOME EXERCISE PROGRAM: Access Code: [email or web address] URL: [email or web address] Date: 05/08/2022 Prepared by: Chyrel Masson  Exercises - Seated Multifidi Isometric  - 1-2 x daily - 7 x weekly - 1 sets - 10 reps - 5-10 hold - Seated Straight Leg Heel Taps  -  1-2 x daily - 7 x weekly - 1-2 sets - 10-15 reps - 2-3 hold - Seated Abdominal Press into [employer]  - 1-2 x daily - 7 x weekly - 1 sets - 10 reps - 5-10 hold - Sit to Stand  - 1-2 x daily - 7 x weekly - 2-3 sets - 10-15 reps - Seated March with Opposite Arm Flexion on Swiss Ball  - 1-2 x daily - 7 x weekly - 1-2 sets - 10-15 reps - Standing Lumbar Extension with Counter  - 3-5 x daily - 7 x weekly - 1 sets - 5-10 reps  ASSESSMENT:  CLINICAL IMPRESSION: He presented today with increased back and knee pain, but tolerated session well with emphasis on progressive LE strengthening. HEP was updated to include lumbar extension and pt was educated to perform throughout day, especially if prolonged standing or morning stiffness brings on increased back sx. MMT of Lt hip flexion and knee extension both improved since evaluation indicating higher strength. He continues to benefit from skilled PT to address mobility and strength impairments and functional limitations.  OBJECTIVE IMPAIRMENTS Abnormal gait, decreased activity tolerance, decreased mobility, decreased ROM, decreased strength, impaired sensation, and pain.   ACTIVITY LIMITATIONS carrying, lifting, bending, sitting, standing, squatting, stairs, and dressing  PARTICIPATION LIMITATIONS: community activity and occupation  PERSONAL FACTORS Time since onset of  injury/illness/exacerbation and 1 comorbidity: see PMH  are also affecting patient's functional outcome.   REHAB POTENTIAL: Good  CLINICAL DECISION MAKING: Stable/uncomplicated  EVALUATION COMPLEXITY: Low   GOALS: Goals reviewed with patient? Yes  SHORT TERM GOALS: Target date: 05/13/2022  Patient will be independent with HEP to continue progressing outside of clinic. Baseline: see objective data Goal status: ONGOING   LONG TERM GOALS: Target date: 06/17/2022  Patient will score FOTO >/= 57% to reflect improved self-perceived functional ability Baseline: see objective  data Goal status: INITIAL   2.  Patient will be independent with a HEP to maintain and progress functional gains from skilled PT. Baseline: see objective data Goal status: INITIAL   3.  Patient will report </= 2/10 pain with sitting, standing, and walking activities. Baseline: see objective data Goal status: INITIAL   4. Patient presents with lumbar extension WNL without increases in pain intensity or peripheralization to allow for upright posture in sitting and standing, promoting normal functional movement ability. Baseline: see objective data Goal status: INITIAL  5.  Patient has 5/5 MMT bilateral hip and knee strength to represent appropriate strength needed for functional activities including community activity, occupation, and household requirements. Baseline: see objective data Goal status: INITIAL  6.  Patient verbalizes and demonstrates ability to perform duties for occupation and ADLs, including prolonged standing, lifting, and dressing. Baseline: see objective data Goal status: INITIAL   PLAN: PT FREQUENCY: 1-2x/week  PT DURATION: 8 weeks  PLANNED INTERVENTIONS: Therapeutic exercises, Therapeutic activity, Neuromuscular re-education, Balance training, Gait training, Patient/Family education, Self Care, Joint mobilization, Stair training, Aquatic Therapy, Dry Needling, Electrical stimulation, Cryotherapy, Moist heat, Ultrasound, Ionotophoresis 4mg /ml Dexamethasone, Manual therapy, Re-evaluation, and physical performance tests .  PLAN FOR NEXT SESSION: address STGs. Progress HEP with additional core and LE strengthening as indicated. Continue to progress LE/core strengthening and lumbar mobility.  Jana Hakim, SPT 05/08/2022, 10:42 AM  This entire session of physical therapy was performed under the direct supervision of PT signing evaluation /treatment. PT reviewed note and agrees.  Scot Jun, PT, DPT, OCS, ATC 05/08/22  10:43 AM

## 2022-05-12 ENCOUNTER — Encounter: Payer: BC Managed Care – PPO | Admitting: Rehabilitative and Restorative Service Providers"

## 2022-05-14 ENCOUNTER — Ambulatory Visit (INDEPENDENT_AMBULATORY_CARE_PROVIDER_SITE_OTHER): Payer: BC Managed Care – PPO | Admitting: Rehabilitative and Restorative Service Providers"

## 2022-05-14 ENCOUNTER — Encounter: Payer: Self-pay | Admitting: Rehabilitative and Restorative Service Providers"

## 2022-05-14 DIAGNOSIS — M6281 Muscle weakness (generalized): Secondary | ICD-10-CM | POA: Diagnosis not present

## 2022-05-14 DIAGNOSIS — M5459 Other low back pain: Secondary | ICD-10-CM | POA: Diagnosis not present

## 2022-05-14 DIAGNOSIS — R293 Abnormal posture: Secondary | ICD-10-CM | POA: Diagnosis not present

## 2022-05-14 DIAGNOSIS — R2689 Other abnormalities of gait and mobility: Secondary | ICD-10-CM | POA: Diagnosis not present

## 2022-05-14 NOTE — Therapy (Signed)
OUTPATIENT PHYSICAL THERAPY TREATMENT   Patient Name: Isaac Fletcher MRN: [id number] DOB:October 26, 1980, 41 y.o., male Today's Date: 05/14/2022  End of Session  PT End of Session - 05/14/22 1005     Visit Number 5    Number of Visits 18    Date for PT Re-Evaluation 06/17/22    Authorization Type BCBS    Authorization Time Period $20 COPAY, 30 PT VISITS    PT Start Time 0927    PT Stop Time 1006    PT Time Calculation (min) 39 min    Activity Tolerance Patient tolerated treatment well    Behavior During Therapy WFL for tasks assessed/performed              Past Medical History:  Diagnosis Date   Back pain    Constipation    H/O hernia repair    Past Surgical History:  Procedure Laterality Date   HERNIA REPAIR     Herniated Disc     LUMBAR LAMINECTOMY N/A 03/21/2021   Procedure: LEFT LEFT FOUR-FIVE MICRODISCECTOMY;  Surgeon: Marybelle Killings, MD;  Location: [facility];  Service: Orthopedics;  Laterality: N/A;   LUMBAR LAMINECTOMY/DECOMPRESSION MICRODISCECTOMY N/A 02/16/2022   Procedure: LEFT FAR LATERAL APPROACH TO REMOVE LEFT [name] DISC HERNIATION;  Surgeon: Jessy Oto, MD;  Location: [place];  Service: Orthopedics;  Laterality: N/A;   Patient Active Problem List   Diagnosis Date Noted   Status post lumbar laminectomy 02/16/2022   Status post lumbar microdiscectomy 04/08/2021   Surgery, elective    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   Lumbar foraminal stenosis 02/19/2021   Trochanteric bursitis, left hip 02/19/2021    PCP: Wardell Honour, MD  REFERRING PROVIDER: Jessy Oto, MD  REFERRING DIAG: [phone] (ICD-10-CM) - S/P lumbar microdiscectomy M54.16 (ICD-10-CM) - Lumbar radiculopathy M79.2 (ICD-10-CM) - Neurogenic pain  Rationale for Evaluation and Treatment Rehabilitation  THERAPY DIAG:  Other low back pain  Muscle weakness (generalized)  Other abnormalities of gait and mobility  Abnormal posture  ONSET DATE: 02/16/22 lumbar  microdiscectomy  SUBJECTIVE:                                                                                                                                                                                           SUBJECTIVE STATEMENT: Pt indicated 5/10 complaints in Lt hip to leg and knee.  Pt indicated waking up that way on several days.  Pt reported continued Lt knee pain c prolonged standing/walking at work activity for 8 hrs.   Reported "contraction in leg muscles as well"  PERTINENT HISTORY:  Lumbar microdiscectomy 02/16/2022, 03/21/2021. Hx of  LLE weakness and neurogenic pain Previous PT 09/2021 - 11/2021  PAIN:  NPRS scale: 5/10 Pain location: left low back/ Lt thigh Pain description: pressure Aggravating factors: standing for extended periods of time, highest pain in mornings Relieving factors: sleeping on his back, movement  NPRS scale: 0/10 upon arrival Pain location: Lt knee Pain description: pressure Aggravating factors: standing for extended periods of time Relieving factors: sitting and laying down   PRECAUTIONS: Previous MD note - avoid frequent bending and stooping, no lifting greater than 10 lbs  WEIGHT BEARING RESTRICTIONS No  FALLS:  Has patient fallen in last 6 months? Yes. 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   04/22/2022 - none performed today due to time   POSTURE:  04/22/2022 Standing - anterior pelvic tilt and weight shift right  LUMBAR ROM:   Active  AROM  04/22/2022 AROM 05/08/2022  Flexion  To mid shin with some pain  Extension 75% WNL with no pain  Right lateral flexion To lateral knee, some pain in Lt low back To lateral knee, some pain in Lt low back  Left lateral flexion To lateral knee, some pain in Lt low back To lateral mid thigh, some pain in Lt low back  Right rotation    Left rotation     (Blank rows = not tested)  LOWER EXTREMITY ROM:     ROM (A: AROM, P: PROM)  Right 04/22/2022 Left 04/22/2022  Hip flexion    Hip extension    Hip abduction    Hip adduction    Hip internal rotation    Hip external rotation    Knee flexion    Knee extension    Ankle dorsiflexion    Ankle plantarflexion    Ankle inversion    Ankle eversion     (Blank rows = not tested)  LOWER EXTREMITY MMT:    MMT Right Eval 04/22/22 seated Left Eval 04/22/22 seated Left 05/08/22  Hip flexion 4/5 3+/5 4/5  Hip extension     Hip abduction     Hip adduction     Hip internal rotation     Hip external rotation     Knee flexion 4/5 4/5   Knee extension 4/5 3+/5 4/5  Ankle dorsiflexion 4/5 5/5   Ankle plantarflexion     Ankle inversion     Ankle eversion      (Blank rows = not tested)  FUNCTIONAL TESTS:  04/22/2022 Sit to/from stand completed without UE support and displayed weight shift Rt throughout movement  GAIT: 04/22/2022 Distance walked: 100' Assistive device utilized: None Level of assistance: Complete Independence Comments: antalgic pattern with reduced stance time on Lt   TODAY'S TREATMENT  05/14/2022: Therex: UBE LE only 4.5 lvl 8 mins  Standing lumbar extension x 5 (review for home use) Leg press 45* BLE 100# x 15, single leg 56# 2 x 15  performed bilateral, cues for controlled and slowed eccentric Supine hamstring stretch c strap 15 sec x 3 Lt  Supine Thomas stretch off side of table 15 sec x 3 Lt Seated hamstring stretch 15 sec x 3 Lt (cues to keep back still0 Seated SLR 2 x 10 bilateral   05/08/2022: Therex: Scifit bike seat 10 level 1 BLEs 6 min  Standing lumbar extension x 6 Leg press 45* BLE 100# x 15, LLE 56# 2 x 15, cues for controlled and slowed eccentric Squats to chair height with 10lb weight x 8 with increased knee pain, STS to chair with foam pad with 10lb weight x 8  6" anterior step ups LLE x 15 with verbal and tactile cues for knee extension in stance 6" lateral step ups LLE x 15 with cueing for knee extension in stance and controlled eccentric with heel tap Physioball roll outs 3 ways 10x each direction HEP updated, printed in English  Moist heat to lower back in prone for 8 min at end of session.   05/05/2022:  Nustep 5 min lvl 6 while taking subjective.  Lower trunk rotation x20 Modified thomas stretch on L LE 2x 30sec hold.  Dead bug supine with alternating UE/ LE until fatigue X2 Supine SLR 2x10 BIL Leg press BIL 50#  x20,  then LLE 50#, R LE #50 x15 each.  Physioball roll outs 3 ways 10x each direction.  Step up's to 6" step 15x each side.  Moist heat to lower back for 8 min at end of session.     PATIENT EDUCATION:  04/22/2022 Education details: [email or web address] Person educated: Patient Education method: Explanation, Demonstration, Tactile cues, Verbal cues, and Handouts Education comprehension: verbalized understanding, returned demonstration, verbal cues required, tactile cues required, and needs further education   HOME EXERCISE PROGRAM: Access Code: [email or web address] URL: [email or web address] Date: 05/14/2022 Prepared by: Scot Jun  Exercises - Seated Multifidi Isometric  - 1-2 x daily - 7 x weekly - 1 sets - 10 reps - 5-10 hold - Seated Straight Leg Heel Taps  - 1-2 x  daily - 7 x weekly - 1-2 sets - 10-15 reps - 2-3 hold - Seated Abdominal Press into [employer]  - 1-2 x daily - 7 x weekly - 1 sets - 10 reps - 5-10 hold - Sit to Stand  - 1-2 x daily - 7 x weekly - 2-3 sets - 10-15 reps - Standing Lumbar Extension with Counter  - 3-5 x daily - 7 x weekly - 1 sets - 5-10 reps - Supine Hamstring Stretch with Strap (Mirrored)  - 2-3 x daily - 7 x weekly - 1 sets - 5 reps - 30 hold - Supine Dynamic Modified Thomas Quad and Hip Flexor Dynamic Stretch  - 2-3 x daily - 7 x weekly - 1 sets - 3 reps - 15 hold - Seated Hamstring Stretch (Mirrored)  - 1-2 x daily - 7 x weekly - 1 sets - 3-5 reps - 15 hold  ASSESSMENT:  CLINICAL IMPRESSION: Reviewed importance of use of stretching and HEP for not only daily strengthening but in times when pains are present in efforts to reduce symptoms.  Detailed importance of taking breaks sitting in prolonged standing in work activity.  Pt to continue to benefit from progressive strengthening and mobility improvements for reduced loading in work activity.   OBJECTIVE IMPAIRMENTS Abnormal  gait, decreased activity tolerance, decreased mobility, decreased ROM, decreased strength, impaired sensation, and pain.   ACTIVITY LIMITATIONS carrying, lifting, bending, sitting, standing, squatting, stairs, and dressing  PARTICIPATION LIMITATIONS: community activity and occupation  PERSONAL FACTORS Time since onset of injury/illness/exacerbation and 1 comorbidity: see PMH  are also affecting patient's functional outcome.   REHAB POTENTIAL: Good  CLINICAL DECISION MAKING: Stable/uncomplicated  EVALUATION COMPLEXITY: Low   GOALS: Goals reviewed with patient? Yes  SHORT TERM GOALS: Target date: 05/13/2022  Patient will be independent with HEP to continue progressing outside of clinic. Baseline: see objective data Goal status: MET 05/14/2022   LONG TERM GOALS: Target date: 06/17/2022  Patient will score FOTO >/= 57% to reflect improved  self-perceived functional ability Baseline: see objective data Goal status: on going - assessed 05/14/2022   2.  Patient will be independent with a HEP to maintain and progress functional gains from skilled PT. Baseline: see objective data Goal status: on going - assessed 05/14/2022   3.  Patient will report </= 2/10 pain with sitting, standing, and walking activities. Baseline: see objective data Goal status: on going - assessed 05/14/2022   4. Patient presents with lumbar extension WNL without increases in pain intensity or peripheralization to allow for upright posture in sitting and standing, promoting normal functional movement ability. Baseline: see objective data Goal status: on going - assessed 05/14/2022  5.  Patient has 5/5 MMT bilateral hip and knee strength to represent appropriate strength needed for functional activities including community activity, occupation, and household requirements. Baseline: see objective data Goal status: on going - assessed 05/14/2022  6.  Patient verbalizes and demonstrates ability to perform duties for occupation and ADLs, including prolonged standing, lifting, and dressing. Baseline: see objective data Goal status: on going - assessed 05/14/2022   PLAN: PT FREQUENCY: 1-2x/week  PT DURATION: 8 weeks  PLANNED INTERVENTIONS: Therapeutic exercises, Therapeutic activity, Neuromuscular re-education, Balance training, Gait training, Patient/Family education, Self Care, Joint mobilization, Stair training, Aquatic Therapy, Dry Needling, Electrical stimulation, Cryotherapy, Moist heat, Ultrasound, Ionotophoresis 68m/ml Dexamethasone, Manual therapy, Re-evaluation, and physical performance tests .  PLAN FOR NEXT SESSION: FOTO reassessment, strength measurements.  Review new HEP.   MScot Jun PT, DPT, OCS, ATC 05/14/22  10:10 AM

## 2022-05-15 ENCOUNTER — Other Ambulatory Visit: Payer: Self-pay | Admitting: Specialist

## 2022-05-19 ENCOUNTER — Other Ambulatory Visit (HOSPITAL_COMMUNITY): Payer: Self-pay

## 2022-05-19 ENCOUNTER — Emergency Department (HOSPITAL_COMMUNITY)
Admission: EM | Admit: 2022-05-19 | Discharge: 2022-05-19 | Disposition: A | Discharge status: Home / Self Care | Payer: BC Managed Care – PPO | Attending: Emergency Medicine | Admitting: Emergency Medicine

## 2022-05-19 ENCOUNTER — Other Ambulatory Visit: Payer: Self-pay

## 2022-05-19 ENCOUNTER — Encounter: Payer: BC Managed Care – PPO | Admitting: Rehabilitative and Restorative Service Providers"

## 2022-05-19 ENCOUNTER — Emergency Department (HOSPITAL_COMMUNITY): Payer: BC Managed Care – PPO

## 2022-05-19 DIAGNOSIS — G8929 Other chronic pain: Secondary | ICD-10-CM

## 2022-05-19 DIAGNOSIS — M5442 Lumbago with sciatica, left side: Secondary | ICD-10-CM | POA: Insufficient documentation

## 2022-05-19 DIAGNOSIS — M545 Low back pain, unspecified: Secondary | ICD-10-CM | POA: Diagnosis present

## 2022-05-19 MED ORDER — HYDROCODONE-ACETAMINOPHEN 5-325 MG PO TABS
1.0000 | ORAL_TABLET | ORAL | 0 refills | Status: AC | PRN
  Filled 2022-05-19: qty 10, 5d supply, fill #0

## 2022-05-19 MED ORDER — METHYLPREDNISOLONE 4 MG PO TBPK
ORAL_TABLET | ORAL | 0 refills | Status: DC
  Filled 2022-05-19: qty 21, 6d supply, fill #0

## 2022-05-19 MED ORDER — KETOROLAC TROMETHAMINE 60 MG/2ML IM SOLN
60.0000 mg | Freq: Once | INTRAMUSCULAR | Status: AC
Start: 2022-05-19 — End: 2022-05-19
  Administered 2022-05-19: 60 mg via INTRAMUSCULAR
  Filled 2022-05-19: qty 2

## 2022-05-19 NOTE — Discharge Instructions (Addendum)
Vous avez t valu au service des urgences et aprs une valuation minutieuse, nous n'avons trouv aucune condition mergente ncessitant une admission ou des tests supplmentaires  l'hpital.  Votre peau CT n'a montr aucun changement significatif par rapport  votre dernire imagerie. Vous pouvez prendre 800 mg d'ibuprofne jusqu' 3 fois par jour et [employer] Norco pour les accs douloureux paroxystiques. Veuillez galement suivre le traitement aux strodes comme indiqu jusqu' la fin. Il est important que vous fassiez un suivi auprs du Dr Otelia Sergeant pour [facility].  Steele Sizer retourner au service des urgences si vous constatez une aggravation de votre tat. Merci de [employer] de faire partie de vos soins.  You were evaluated in the Emergency Department and after careful evaluation, we did not find any emergent condition requiring admission or further testing in the hospital.  Your CT skin did not show any significant changes from your last imaging.  You may take 800 mg of ibuprofen up to 3 times daily, and use Norco for breakthrough pain.  Please also take the course of steroids as directed until finished.  It is important that you follow-up with Dr. Otelia Sergeant for your pain.  Please return to the Emergency Department if you experience any worsening of your condition.   Thank you for allowing [place] to be a part of your care.

## 2022-05-19 NOTE — ED Triage Notes (Signed)
Pt. Stated, I had back surgery in June and now I cant sleep and have back pain

## 2022-05-19 NOTE — ED Provider Notes (Cosign Needed Addendum)
[facility] EMERGENCY DEPARTMENT Provider Note   CSN: [id number] Arrival date & time: 05/19/22  8676     History  Chief Complaint  Patient presents with   Insomnia   Back Pain    Isaac Fletcher is a 41 y.o. male.  HPI 41 year old male presents to the ER with complaints of low back pain.  He had a left [name] discectomy performed by Dr. Otelia Sergeant in June of this year.  He states he has had consistent low back pain since then, he was told this pain would get better but it has continued.  He reports that over the last several days his pain has gotten worse, states he has increased pain with turning, getting out of bed or any kind of movement.  He denies any loss of bowel bladder control, states he is constipated but this appears to be chronic.  Last bowel movement was 2 days ago.  He took Tylenol 2 days ago with little relief.  Denies any foot drop.  He reports occasional radiation down his left leg and some calf cramping.  Denies any fevers, chills, IV drug use.  Denies any injuries or falls.  He has been compliant with physical therapy.    Home Medications Prior to Admission medications   Medication Sig Start Date End Date Taking? Authorizing Provider  HYDROcodone-acetaminophen (NORCO/VICODIN) 5-325 MG tablet Take 1 tablet by mouth every 4 (four) hours as needed for up to 5 days. 05/19/22 05/24/22 Yes Mare Ferrari, [name]  methylPREDNISolone (MEDROL DOSEPAK) 4 MG TBPK tablet Take as directed until finished 05/19/22  Yes Felicitas Sine A, [name]  cyclobenzaprine (FLEXERIL) 10 MG tablet Take 1 tablet (10 mg total) by mouth 3 (three) times daily as needed for muscle spasms. 04/08/22   Kerrin Champagne, MD  diclofenac (VOLTAREN) 75 MG EC tablet Take 1 tablet (75 mg total) by mouth 2 (two) times daily as needed (WITH FOOD). 03/05/22   Naida Sleight, [name]  docusate sodium (COLACE) 100 MG capsule Take 1 capsule (100 mg total) by mouth 2 (two) times daily. 02/17/22   Kerrin Champagne, MD   gabapentin (NEURONTIN) 300 MG capsule TAKE 1 CAPSULE BY MOUTH TWICE DAILY AND TAKE 2 CAPSULE BY MOUTH AT BEDTIME 05/15/22   Kerrin Champagne, MD  Multiple Vitamins-Minerals (CENTRUM ADULT PO) Take 1 tablet by mouth daily.    [provider]      Allergies    Patient has no known allergies.    Review of Systems   Review of Systems Ten systems reviewed and are negative for acute change, except as noted in the HPI.   Physical Exam Updated Vital Signs BP (!) 121/95   Pulse (!) 104   Temp 98 F (36.7 C) (Oral)   Resp 17   SpO2 100%  Physical Exam Vitals reviewed.  Constitutional:      Appearance: Normal appearance.  HENT:     Head: Normocephalic.  Cardiovascular:     Rate and Rhythm: Normal rate and regular rhythm.     Pulses: Normal pulses.     Heart sounds: Normal heart sounds, S1 normal and S2 normal. No murmur heard.    No friction rub.  Pulmonary:     Effort: Pulmonary effort is normal.     Breath sounds: Normal breath sounds.  Abdominal:     General: Abdomen is flat.     Palpations: Abdomen is soft.  Musculoskeletal:     Right lower leg: No edema.  Left lower leg: No edema.  Neurological:     General: No focal deficit present.     Mental Status: He is alert.     Comments: Midline tenderness to the lower lumbar spine just below the well-healed incision.  5/5 strength in upper and lower extremities bilaterally.  Sensations are intact.  5/5 strength with plantarflexion and dorsiflexion.  Patient is moving around gingerly.  Psychiatric:        Mood and Affect: Mood normal.        Behavior: Behavior normal.     ED Results / Procedures / Treatments   Labs (all labs ordered are listed, but only abnormal results are displayed) Labs Reviewed - No data to display  EKG None  Radiology CT Lumbar Spine Wo Contrast  Result Date: 05/19/2022 CLINICAL DATA:  Worsening low back pain. History of surgery in June. EXAM: CT LUMBAR SPINE WITHOUT CONTRAST TECHNIQUE:  Multidetector CT imaging of the lumbar spine was performed without intravenous contrast administration. Multiplanar CT image reconstructions were also generated. RADIATION DOSE REDUCTION: This exam was performed according to the departmental dose-optimization program which includes automated exposure control, adjustment of the mA and/or kV according to patient size and/or use of iterative reconstruction technique. COMPARISON:  Lumbar spine MRI 12/16/2021, CT lumbar spine 10/07/2021 FINDINGS: Segmentation: Standard; the lowest formed disc space is designated L5-S1. Alignment: There is straightening of the normal lumbar lordosis. There is no antero or retrolisthesis. Vertebrae: Vertebral body heights are preserved. There is no suspicious osseous lesion. There is no evidence of acute injury. Paraspinal and other soft tissues: Unremarkable. Disc levels: The disc heights are preserved. There is minimal facet arthropathy in the lower lumbar spine. Postsurgical changes reflecting left microdiscectomy at L4-L5 are again seen. There is a diffuse disc bulge eccentric to the left and mild degenerative endplate spurring resulting in moderate bilateral neural foraminal stenosis without significant spinal canal stenosis, unchanged. There is a mild disc bulge at L5-S1 contributing to moderate right and mild-to-moderate left neural foraminal stenosis, unchanged. IMPRESSION: 1. No evidence of acute injury in the lumbar spine. 2. Status post left microdiscectomy at L4-L5 with a diffuse disc bulge contributing to moderate bilateral neural foraminal stenosis, unchanged since the MRI from 12/16/2021. 3. Mild disc bulge at L5-S1 contributing to moderate right and mild-to-moderate left neural foraminal stenosis, also not significantly changed. Electronically Signed   By: Lesia Hausen M.D.   On: 05/19/2022 11:54    Procedures Procedures    Medications Ordered in ED Medications  ketorolac (TORADOL) injection 60 mg (60 mg  Intramuscular Given 05/19/22 4650)    ED Course/ Medical Decision Making/ A&P                           Medical Decision Making Amount and/or Complexity of Data Reviewed Radiology: ordered.  Risk Prescription drug management.  41 year old male presenting with complaints of low back pain.  On arrival, his blood pressure was mildly elevated at 145/94, afebrile, not tachycardic, tachypneic or hypoxic.  His physical exam is largely unremarkable, he has intact strength and sensation in bilateral lower extremities.  He has no loss of bowel bladder control.  He has no IV drug use, fevers, I have low suspicion for a epidural abscess or infection.  He was able to ambulate in the ER but gingerly.  I have low suspicion for renal stone as his pain is very midline near his incision.  He was given Toradol for pain.  CT of the lumbar spine was ordered without contrast, reviewed, agree with radiology read  IMPRESSION:  1. No evidence of acute injury in the lumbar spine.  2. Status post left microdiscectomy at L4-L5 with a diffuse disc  bulge contributing to moderate bilateral neural foraminal stenosis,  unchanged since the MRI from 12/16/2021.  3. Mild disc bulge at L5-S1 contributing to moderate right and  mild-to-moderate left neural foraminal stenosis, also not  significantly changed.      Electronically Signed    No significant changes noted on CT scan.  On reevaluation, patient noted significant improvement in pain with Toradol.  Will prescribe additional course for Norco, PDMP reviewed, appropriate.  We will also give a course of steroids and stressed follow-up with Dr. Otelia Sergeant with neurosurgery.  Low suspicion for fracture, cauda equina, abscess.  We discussed return precautions.  He was understanding and is agreeable.  Stable for discharge.    Final Clinical Impression(s) / ED Diagnoses Final diagnoses:  Chronic left-sided low back pain with left-sided sciatica    Rx / DC Orders ED  Discharge Orders          Ordered    methylPREDNISolone (MEDROL DOSEPAK) 4 MG TBPK tablet        05/19/22 1232    HYDROcodone-acetaminophen (NORCO/VICODIN) 5-325 MG tablet  Every 4 hours PRN        05/19/22 1232              Leone Brand 05/19/22 1247    Gerhard Munch, MD 05/20/22 1555

## 2022-05-19 NOTE — ED Notes (Signed)
Discharge instructions reviewed with patient. Patient denies any questions or concerns. Patient ambulatory out of ED. 

## 2022-05-21 ENCOUNTER — Encounter: Payer: Self-pay | Admitting: Specialist

## 2022-05-21 ENCOUNTER — Encounter: Payer: Self-pay | Admitting: Rehabilitative and Restorative Service Providers"

## 2022-05-21 ENCOUNTER — Ambulatory Visit: Payer: Self-pay

## 2022-05-21 ENCOUNTER — Ambulatory Visit (INDEPENDENT_AMBULATORY_CARE_PROVIDER_SITE_OTHER): Payer: BC Managed Care – PPO | Admitting: Rehabilitative and Restorative Service Providers"

## 2022-05-21 ENCOUNTER — Ambulatory Visit (INDEPENDENT_AMBULATORY_CARE_PROVIDER_SITE_OTHER): Payer: BC Managed Care – PPO | Admitting: Specialist

## 2022-05-21 VITALS — BP 156/72 | HR 87 | Ht 72.0 in | Wt 187.0 lb

## 2022-05-21 DIAGNOSIS — M7062 Trochanteric bursitis, left hip: Secondary | ICD-10-CM

## 2022-05-21 DIAGNOSIS — R293 Abnormal posture: Secondary | ICD-10-CM

## 2022-05-21 DIAGNOSIS — M792 Neuralgia and neuritis, unspecified: Secondary | ICD-10-CM | POA: Diagnosis not present

## 2022-05-21 DIAGNOSIS — M2170 Unequal limb length (acquired), unspecified site: Secondary | ICD-10-CM | POA: Diagnosis not present

## 2022-05-21 DIAGNOSIS — M5459 Other low back pain: Secondary | ICD-10-CM | POA: Diagnosis not present

## 2022-05-21 DIAGNOSIS — M5416 Radiculopathy, lumbar region: Secondary | ICD-10-CM | POA: Diagnosis not present

## 2022-05-21 DIAGNOSIS — M6281 Muscle weakness (generalized): Secondary | ICD-10-CM

## 2022-05-21 DIAGNOSIS — R2689 Other abnormalities of gait and mobility: Secondary | ICD-10-CM

## 2022-05-21 DIAGNOSIS — Z9889 Other specified postprocedural states: Secondary | ICD-10-CM | POA: Diagnosis not present

## 2022-05-21 MED ORDER — TRAMADOL-ACETAMINOPHEN 37.5-325 MG PO TABS: 1.0000 | ORAL_TABLET | Freq: Four times a day (QID) | ORAL | 0 refills | Status: AC | PRN

## 2022-05-21 NOTE — Therapy (Signed)
OUTPATIENT PHYSICAL THERAPY TREATMENT   Patient Name: Isaac Fletcher MRN: [id number] DOB:1981-03-03, 41 y.o., male Today's Date: 05/21/2022  End of Session  PT End of Session - 05/21/22 1047     Visit Number 6    Number of Visits 18    Date for PT Re-Evaluation 06/17/22    Authorization Type BCBS    Authorization Time Period $20 COPAY, 30 PT VISITS    PT Start Time 1047    PT Stop Time 1143    PT Time Calculation (min) 56 min    Activity Tolerance Patient tolerated treatment well    Behavior During Therapy WFL for tasks assessed/performed             Past Medical History:  Diagnosis Date   Back pain    Constipation    H/O hernia repair    Past Surgical History:  Procedure Laterality Date   HERNIA REPAIR     Herniated Disc     LUMBAR LAMINECTOMY N/A 03/21/2021   Procedure: LEFT LEFT FOUR-FIVE MICRODISCECTOMY;  Surgeon: Marybelle Killings, MD;  Location: [facility];  Service: Orthopedics;  Laterality: N/A;   LUMBAR LAMINECTOMY/DECOMPRESSION MICRODISCECTOMY N/A 02/16/2022   Procedure: LEFT FAR LATERAL APPROACH TO REMOVE LEFT [name] DISC HERNIATION;  Surgeon: Jessy Oto, MD;  Location: [facility];  Service: Orthopedics;  Laterality: N/A;   Patient Active Problem List   Diagnosis Date Noted   Status post lumbar laminectomy 02/16/2022   Status post lumbar microdiscectomy 04/08/2021   Surgery, elective    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   Lumbar foraminal stenosis 02/19/2021   Trochanteric bursitis, left hip 02/19/2021    PCP: Wardell Honour, MD  REFERRING PROVIDER: Jessy Oto, MD  REFERRING DIAG: [phone] (ICD-10-CM) - S/P lumbar microdiscectomy M54.16 (ICD-10-CM) - Lumbar radiculopathy M79.2 (ICD-10-CM) - Neurogenic pain  Rationale for Evaluation and Treatment Rehabilitation  THERAPY DIAG:  Other low back pain  Muscle weakness (generalized)  Other abnormalities of gait and mobility  Abnormal posture  ONSET DATE: 02/16/22 lumbar  microdiscectomy  SUBJECTIVE:                                                                                                                                                                                           SUBJECTIVE STATEMENT: Pt reports that the MD told him one leg is higher than the other based on the CT scan imaging and he was provided a lift for under the Rt leg. He feels better in the morning with increased sx through the afternoon and evening. He has muscle spasms and cramping in the Lt thigh that increases  with working. He doesn't have pain on days without working where he can sit and rest more with walking in day. He does the HEP more on days when he isn't working and also bikes 2x/week for 8-10 minutes. He typically only has coffee in the morning and doesn't eat until 3 pm.  PERTINENT HISTORY:  Lumbar microdiscectomy 02/16/2022, 03/21/2021. Hx of LLE weakness and neurogenic pain Previous PT 09/2021 - 11/2021  PAIN:  NPRS scale: 0/10 Pain location: left low back/ Lt thigh Pain description: pressure Aggravating factors: standing for extended periods of time, highest pain in mornings Relieving factors: sleeping on his back, movement  NPRS scale: 0/10 upon arrival Pain location: Lt knee Pain description: pressure Aggravating factors: standing for extended periods of time Relieving factors: sitting and laying down   PRECAUTIONS: Previous MD note - avoid frequent bending and stooping, no lifting greater than 10 lbs  WEIGHT BEARING RESTRICTIONS No  FALLS:  Has patient fallen in last 6 months? Yes. 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   04/22/2022 - none performed today due to time   POSTURE:  04/22/2022 Standing - anterior pelvic tilt and weight shift right  LUMBAR ROM:   Active  AROM  04/22/2022 AROM 05/08/2022  Flexion  To mid shin with some pain  Extension 75% WNL with no pain  Right lateral flexion To lateral knee, some pain in Lt low back To lateral knee, some pain in Lt low back  Left lateral flexion To lateral knee, some pain in Lt low back To lateral mid thigh, some pain in Lt low back  Right rotation    Left rotation     (Blank rows = not tested)  LOWER EXTREMITY ROM:     ROM (A: AROM, P: PROM)  Right 04/22/2022 Left 04/22/2022  Hip flexion    Hip extension    Hip abduction    Hip adduction    Hip internal rotation    Hip external rotation    Knee flexion    Knee extension    Ankle dorsiflexion    Ankle plantarflexion    Ankle inversion    Ankle eversion     (Blank rows = not tested)  LOWER EXTREMITY MMT:    MMT Right Eval 04/22/22 seated Left Eval 04/22/22 seated Left 05/08/22  Hip flexion 4/5 3+/5 4/5  Hip extension     Hip abduction     Hip adduction     Hip internal rotation     Hip external rotation     Knee flexion 4/5 4/5   Knee extension 4/5 3+/5 4/5  Ankle dorsiflexion 4/5 5/5   Ankle plantarflexion     Ankle inversion     Ankle eversion      (  Blank rows = not tested)  LEG LENGTH MEASUREMENTS: 05/21/22 RLE 35.75" LLE 36"  FUNCTIONAL TESTS:  04/22/2022 Sit to/from stand completed without UE support  and displayed weight shift Rt throughout movement  GAIT: 04/22/2022 Distance walked: 100' Assistive device utilized: None Level of assistance: Complete Independence Comments: antalgic pattern with reduced stance time on Lt   TODAY'S TREATMENT  05/21/2022: Therex: UBE LE only 4.5 lvl 8 mins  Leg press 45* BLE 112# 2 x 15, single leg 62# 2 x 15 LLE, cues for controlled and slowed eccentric Supine bridges x 15 with 3 sec hold Bird dogs x 8 with pt reported cramp, numbness and tingling in medial Lt ankle region Sitting pelvic tilts x 10 with cueing for posture and abdominal contraction PT reviewed standing lumbar extension for use during work day and biking at home with moderate exertion, pt verbalized understanding  Self Care: PT advised drinking more water throughout day and consuming foods with more electrolytes, including eating breakfast prior to work and PT and pt verbalized understanding   05/14/2022: Therex: UBE LE only 4.5 lvl 8 mins  Standing lumbar extension x 5 (review for home use) Leg press 45* BLE 100# x 15, single leg 56# 2 x 15 performed bilateral, cues for controlled and slowed eccentric Supine hamstring stretch c strap 15 sec x 3 Lt  Supine Thomas stretch off side of table 15 sec x 3 Lt Seated hamstring stretch 15 sec x 3 Lt (cues to keep back still0 Seated SLR 2 x 10 bilateral   05/08/2022: Therex: Scifit bike seat 10 level 1 BLEs 6 min  Standing lumbar extension x 6 Leg press 45* BLE 100# x 15, LLE 56# 2 x 15, cues for controlled and slowed eccentric Squats to chair height with 10lb weight x 8 with increased knee pain, STS to chair with foam pad with 10lb weight x 8  6" anterior step ups LLE x 15 with verbal and tactile cues for knee extension in stance 6" lateral step ups LLE x 15 with cueing for knee extension in stance and controlled eccentric with heel tap Physioball roll outs 3 ways 10x each direction HEP updated, printed in English  Moist heat to lower  back in prone for 8 min at end of session.    PATIENT EDUCATION:  04/22/2022 Education details: [id number] Person educated: Patient Education method: Explanation, Demonstration, Tactile cues, Verbal cues, and Handouts Education comprehension: verbalized understanding, returned demonstration, verbal cues required, tactile cues required, and needs further education   HOME EXERCISE PROGRAM: Access Code: [email or web address] URL: [email or web address] Date: 05/14/2022 Prepared by: Scot Jun  Exercises - Seated Multifidi Isometric  - 1-2 x daily - 7 x weekly - 1 sets - 10 reps - 5-10 hold - Seated Straight Leg Heel Taps  - 1-2 x daily - 7 x weekly - 1-2 sets - 10-15 reps - 2-3 hold - Seated Abdominal Press into [employer]  - 1-2 x daily - 7 x weekly - 1 sets - 10 reps - 5-10 hold - Sit to Stand  - 1-2 x daily - 7 x weekly - 2-3 sets - 10-15 reps - Standing Lumbar Extension with Counter  - 3-5 x daily - 7 x weekly - 1 sets - 5-10 reps - Supine Hamstring Stretch with Strap (Mirrored)  - 2-3 x daily - 7 x weekly - 1 sets - 5 reps - 30 hold - Supine Dynamic Modified Karl Pock and Hip Flexor Dynamic Stretch  - 2-3 x daily -  7 x weekly - 1 sets - 3 reps - 15 hold - Seated Hamstring Stretch (Mirrored)  - 1-2 x daily - 7 x weekly - 1 sets - 3-5 reps - 15 hold  ASSESSMENT:  CLINICAL IMPRESSION: He is continuing to progress with LE and lumbar strengthening. He reported high amounts of quad cramping today and pt education included suggestions for diet and hydration. He continues to benefit from skilled PT to address strength impairments and functional limitations with work activity.  OBJECTIVE IMPAIRMENTS Abnormal gait, decreased activity tolerance, decreased mobility, decreased ROM, decreased strength, impaired sensation, and pain.   ACTIVITY LIMITATIONS carrying, lifting, bending, sitting, standing, squatting, stairs, and dressing  PARTICIPATION LIMITATIONS: community activity and  occupation  PERSONAL FACTORS Time since onset of injury/illness/exacerbation and 1 comorbidity: see PMH  are also affecting patient's functional outcome.   REHAB POTENTIAL: Good  CLINICAL DECISION MAKING: Stable/uncomplicated  EVALUATION COMPLEXITY: Low   GOALS: Goals reviewed with patient? Yes  SHORT TERM GOALS: Target date: 05/13/2022  Patient will be independent with HEP to continue progressing outside of clinic. Baseline: see objective data Goal status: MET 05/14/2022   LONG TERM GOALS: Target date: 06/17/2022  Patient will score FOTO >/= 57% to reflect improved self-perceived functional ability Baseline: see objective data Goal status: on going - assessed 05/14/2022   2.  Patient will be independent with a HEP to maintain and progress functional gains from skilled PT. Baseline: see objective data Goal status: on going - assessed 05/14/2022   3.  Patient will report </= 2/10 pain with sitting, standing, and walking activities. Baseline: see objective data Goal status: on going - assessed 05/14/2022   4. Patient presents with lumbar extension WNL without increases in pain intensity or peripheralization to allow for upright posture in sitting and standing, promoting normal functional movement ability. Baseline: see objective data Goal status: on going - assessed 05/14/2022  5.  Patient has 5/5 MMT bilateral hip and knee strength to represent appropriate strength needed for functional activities including community activity, occupation, and household requirements. Baseline: see objective data Goal status: on going - assessed 05/14/2022  6.  Patient verbalizes and demonstrates ability to perform duties for occupation and ADLs, including prolonged standing, lifting, and dressing. Baseline: see objective data Goal status: on going - assessed 05/14/2022   PLAN: PT FREQUENCY: 1-2x/week  PT DURATION: 8 weeks  PLANNED INTERVENTIONS: Therapeutic exercises, Therapeutic activity,  Neuromuscular re-education, Balance training, Gait training, Patient/Family education, Self Care, Joint mobilization, Stair training, Aquatic Therapy, Dry Needling, Electrical stimulation, Cryotherapy, Moist heat, Ultrasound, Ionotophoresis 6m/ml Dexamethasone, Manual therapy, Re-evaluation, and physical performance tests .  PLAN FOR NEXT SESSION: strength measurements, continue core/lumbar and LE strengthening   KJana Hakim Student-PT 05/21/2022, 12:54 PM  This entire session of physical therapy was performed under the direct supervision of PT signing evaluation /treatment. PT reviewed note and agrees.  MScot Jun PT, DPT, OCS, ATC 05/21/22  12:54 PM

## 2022-05-21 NOTE — Patient Instructions (Addendum)
Plan: Avoid frequent bending and stooping  No lifting greater than 10 lbs. May use ice or moist heat for pain. Weight loss is of benefit. Best medication for lumbar disc disease is arthritis medications like voltaren. Exercise is important to improve your indurance and does allow people to function better inspite of back pain. Gabapentin for neurogenic pain. Physical Therapy for left leg strengthening and core motor strengthening Ultracet for pain 1-2 every 6 hours prn Continue use of the prednisone till done wait one day then restart diclofenac Alla Feeling work belt for use when at work 7/16th inch right heel lift for the leg length discrepancy.

## 2022-05-21 NOTE — Progress Notes (Signed)
[email or web address] [email or web address]   Office Visit Note   Patient: Isaac Fletcher           Date of Birth: [birth year]           MRN: [id number] Visit Date: 05/21/2022              Requested by: Frederica Kuster, MD [address] [place],  [place] [address] PCP: Pcp, No   Assessment & Plan: Visit Diagnoses:  1. Status post lumbar microdiscectomy   2. Lumbar radiculopathy   3. Neurogenic pain   4. Trochanteric bursitis, left hip   5. Leg length discrepancy     Plan: Avoid frequent bending and stooping  No lifting greater than 10 lbs. May use ice or moist heat for pain. Weight loss is of benefit. Best medication for lumbar disc disease is arthritis medications like voltaren. Exercise is important to improve your indurance and does allow people to function better inspite of back pain. Gabapentin for neurogenic pain. Physical Therapy for left leg strengthening and core motor strengthening Ultracet for pain 1-2 every 6 hours prn Continue use of the prednisone till done wait one day then restart diclofenac Alla Feeling work belt for use when at work.  7/16th inch right heel lift for the leg length discrepancy. Orders:  Orders Placed This Encounter  Procedures   XR Lumbar Spine 2-3 Views   No orders of the defined types were placed in this encounter.     Procedures: No procedures performed   Clinical Data: No additional findings.   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 He had persistent pain and difficulty with standing and walking. Underwent a far lateral approach to resection of left [name] HNP foramenal and extraforamenal 6/12.2023. He is now 3  months post op.      Review of Systems  Constitutional: Negative.   HENT: Negative.    Eyes: Negative.    Respiratory: Negative.    Cardiovascular: Negative.   Gastrointestinal: Negative.   Endocrine: Negative.   Genitourinary: Negative.   Musculoskeletal: Negative.   Skin: Negative.   Allergic/Immunologic: Negative.   Neurological: Negative.   Hematological: Negative.   Psychiatric/Behavioral: Negative.       Objective: Vital Signs: There were no vitals taken for this visit.  Physical Exam Constitutional:      Appearance: He is well-developed.  HENT:     Head: Normocephalic and atraumatic.  Eyes:     Pupils: Pupils are equal, round, and reactive to light.  Pulmonary:     Effort: Pulmonary effort is normal.     Breath sounds: Normal breath sounds.  Abdominal:     General: Bowel sounds are normal.     Palpations: Abdomen is soft.  Musculoskeletal:        General: Normal range of motion.     Cervical back: Normal range of motion and neck supple.  Skin:    General: Skin is warm and dry.  Neurological:     Mental Status: He is alert and oriented to person, place, and time.  Psychiatric:        Behavior: Behavior normal.        Thought Content: Thought content normal.        Judgment: Judgment normal.  Back Exam   Tenderness  The patient is experiencing tenderness in the lumbar.  Muscle Strength  Right Quadriceps:  5/5  Left Quadriceps:  5/5  Right Hamstrings:  5/5  Left Hamstrings:  5/5   Reflexes  Patellar:  0/4 Achilles:  0/4  Comments:  SLR is negative He has atrophy of the left calf and left thigh by 1 inch.       Specialty Comments:  MRI LUMBAR SPINE WITHOUT AND WITH CONTRAST     TECHNIQUE:  Multiplanar and multiecho pulse sequences of the lumbar spine were  obtained without and with intravenous contrast.     CONTRAST:  78mL MULTIHANCE GADOBENATE DIMEGLUMINE 529 MG/ML IV SOLN     COMPARISON:  MRI of the lumbar spine November 14, 2020.     FINDINGS:  Segmentation: Standard segmentation is seen. The inferior-most fully  formed  intervertebral disc is labeled L5-S1. Same numbering as on  the prior MRI.     Alignment: Straightening of the normal lumbar lordosis. No  substantial sagittal subluxation.     Vertebrae: Congenitally short pedicles. Vertebral body heights are  maintained. No focal marrow edema to suggest acute fracture or  discitis/osteomyelitis.     Conus medullaris and cauda equina: Conus extends to the L2 level.  Conus and cauda equina appear normal. No abnormal enhancement of the  conus or cauda equina.     Paraspinal and other soft tissues: Postoperative changes in the  lower left lumbar spine. Otherwise, unremarkable.     Disc levels:     T12-L1: No significant disc protrusion, foraminal stenosis, or canal  stenosis.     L1-L2: No significant disc protrusion, foraminal stenosis, or canal  stenosis.     L2-L3: No significant disc protrusion, foraminal stenosis, or canal  stenosis.     L3-L4: No significant disc protrusion, foraminal stenosis, or canal  stenosis.     L4-L5: Disc height loss and desiccation. Broad disc bulge. Enhancing  scar tissue in the posterolateral left canal and left foramen,  compatible with sequela interval micro discectomy. This scar tissue  limits assessment; however, left foraminal stenosis is likely  improved without evidence of recurrent disc herniation. No  significant canal stenosis. Similar moderate right foraminal  stenosis.     L5-S1: Mild disc bulging with moderate right and mild left foraminal  stenosis. No significant canal stenosis.     IMPRESSION:  1. Interval left L4-L5 micro discectomy. Scar tissue in the left  foramen and lateral canal limits assessment, but suspect improved  foraminal stenosis without evidence of recurrent herniation. Similar  moderate right foraminal stenosis at this level.  2. At L5-S1, similar moderate right and mild left foraminal  stenosis.        Electronically Signed    By: Feliberto Harts M.D.    On:  07/07/2021 12:07  Imaging: No results found.   PMFS History: Patient Active Problem List   Diagnosis Date Noted   Status post lumbar laminectomy 02/16/2022   Status post lumbar microdiscectomy 04/08/2021   Surgery, elective    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   Lumbar foraminal stenosis 02/19/2021   Trochanteric bursitis, left hip 02/19/2021   Past Medical History:  Diagnosis Date   Back pain    Constipation    H/O hernia repair     Family History  Problem Relation Age of Onset   Diabetes Father     Past Surgical History:  Procedure Laterality Date   HERNIA REPAIR  Herniated Disc     LUMBAR LAMINECTOMY N/A 03/21/2021   Procedure: LEFT LEFT FOUR-FIVE MICRODISCECTOMY;  Surgeon: [name], MD;  Location: [facility];  Service: Orthopedics;  Laterality: N/A;   LUMBAR LAMINECTOMY/DECOMPRESSION MICRODISCECTOMY N/A 02/16/2022   Procedure: LEFT FAR LATERAL APPROACH TO REMOVE LEFT [name] DISC HERNIATION;  Surgeon: Kerrin Champagne, MD;  Location: [facility];  Service: Orthopedics;  Laterality: N/A;   Social History   Occupational History   Not on file  Tobacco Use   Smoking status: Never   Smokeless tobacco: Never  Vaping Use   Vaping Use: Never used  Substance and Sexual Activity   Alcohol use: Yes   Drug use: Never   Sexual activity: Not on file

## 2022-05-25 ENCOUNTER — Ambulatory Visit (INDEPENDENT_AMBULATORY_CARE_PROVIDER_SITE_OTHER): Payer: BC Managed Care – PPO | Admitting: Physical Therapy

## 2022-05-25 ENCOUNTER — Encounter: Payer: Self-pay | Admitting: Physical Therapy

## 2022-05-25 DIAGNOSIS — G8929 Other chronic pain: Secondary | ICD-10-CM

## 2022-05-25 DIAGNOSIS — M6281 Muscle weakness (generalized): Secondary | ICD-10-CM

## 2022-05-25 DIAGNOSIS — M5459 Other low back pain: Secondary | ICD-10-CM

## 2022-05-25 DIAGNOSIS — R293 Abnormal posture: Secondary | ICD-10-CM

## 2022-05-25 DIAGNOSIS — R2689 Other abnormalities of gait and mobility: Secondary | ICD-10-CM

## 2022-05-25 DIAGNOSIS — M545 Low back pain, unspecified: Secondary | ICD-10-CM

## 2022-05-25 NOTE — Therapy (Signed)
OUTPATIENT PHYSICAL THERAPY TREATMENT   Patient Name: Isaac Fletcher MRN: [id number] DOB:May 06, 1981, 41 y.o., male Today's Date: 05/25/2022  End of Session  PT End of Session - 05/25/22 1227     Visit Number 7    Number of Visits 18    Date for PT Re-Evaluation 06/17/22    Authorization Type BCBS    Authorization Time Period $20 COPAY, 30 PT VISITS    PT Start Time 1146    PT Stop Time 1225    PT Time Calculation (min) 39 min    Activity Tolerance Patient tolerated treatment well    Behavior During Therapy WFL for tasks assessed/performed              Past Medical History:  Diagnosis Date   Back pain    Constipation    H/O hernia repair    Past Surgical History:  Procedure Laterality Date   HERNIA REPAIR     Herniated Disc     LUMBAR LAMINECTOMY N/A 03/21/2021   Procedure: LEFT LEFT FOUR-FIVE MICRODISCECTOMY;  Surgeon: Marybelle Killings, MD;  Location: [facility];  Service: Orthopedics;  Laterality: N/A;   LUMBAR LAMINECTOMY/DECOMPRESSION MICRODISCECTOMY N/A 02/16/2022   Procedure: LEFT FAR LATERAL APPROACH TO REMOVE LEFT [name] DISC HERNIATION;  Surgeon: Jessy Oto, MD;  Location: [facility];  Service: Orthopedics;  Laterality: N/A;   Patient Active Problem List   Diagnosis Date Noted   Status post lumbar laminectomy 02/16/2022   Status post lumbar microdiscectomy 04/08/2021   Surgery, elective    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   Lumbar foraminal stenosis 02/19/2021   Trochanteric bursitis, left hip 02/19/2021    PCP: Wardell Honour, MD  REFERRING PROVIDER: Jessy Oto, MD  REFERRING DIAG: [phone] (ICD-10-CM) - S/P lumbar microdiscectomy M54.16 (ICD-10-CM) - Lumbar radiculopathy M79.2 (ICD-10-CM) - Neurogenic pain  Rationale for Evaluation and Treatment Rehabilitation  THERAPY DIAG:  Other low back pain  Muscle weakness (generalized)  Other abnormalities of gait and mobility  Abnormal posture  Chronic low back pain without  sciatica, unspecified back pain laterality  ONSET DATE: 02/16/22 lumbar microdiscectomy  SUBJECTIVE:                                                                                                                                                                                           SUBJECTIVE STATEMENT: Pt states that he continues to have pain in his L knee with spasms into his L thigh.   PERTINENT HISTORY:  Lumbar microdiscectomy 02/16/2022, 03/21/2021. Hx of LLE weakness and neurogenic pain Previous PT 09/2021 - 11/2021  PAIN:  NPRS scale: 0/10  Pain location: left low back/ Lt thigh Pain description: pressure Aggravating factors: standing for extended periods of time, highest pain in mornings Relieving factors: sleeping on his back, movement  NPRS scale: 0/10 upon arrival Pain location: Lt knee Pain description: pressure Aggravating factors: standing for extended periods of time Relieving factors: sitting and laying down   PRECAUTIONS: Previous MD note - avoid frequent bending and stooping, no lifting greater than 10 lbs  WEIGHT BEARING RESTRICTIONS No  FALLS:  Has patient fallen in last 6 months? Yes. 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   04/22/2022 - none performed today due to time   POSTURE:  04/22/2022 Standing - anterior pelvic tilt and weight shift right  LUMBAR ROM:   Active  AROM  04/22/2022 AROM 05/08/2022  Flexion  To mid shin with some pain  Extension 75% WNL with no pain  Right lateral flexion To lateral knee, some pain in Lt low back To lateral knee, some pain in Lt low back  Left lateral flexion To lateral knee, some pain in Lt low back To lateral mid thigh, some pain in Lt low back  Right rotation    Left rotation     (Blank rows = not tested)  LOWER EXTREMITY ROM:     ROM (A: AROM, P: PROM)  Right 04/22/2022 Left 04/22/2022  Hip flexion    Hip extension    Hip abduction    Hip adduction    Hip internal rotation    Hip external rotation    Knee flexion    Knee extension    Ankle dorsiflexion    Ankle plantarflexion    Ankle inversion    Ankle eversion     (Blank rows = not tested)  LOWER EXTREMITY MMT:    MMT Right Eval 04/22/22 seated Left Eval 04/22/22 seated Left 05/08/22  Hip flexion 4/5 3+/5 4/5  Hip extension     Hip abduction     Hip adduction     Hip internal rotation     Hip external rotation     Knee flexion 4/5 4/5   Knee extension 4/5 3+/5 4/5  Ankle dorsiflexion 4/5 5/5   Ankle plantarflexion     Ankle inversion     Ankle eversion      (Blank rows = not tested)  LEG LENGTH MEASUREMENTS: 05/21/22 RLE 35.75" LLE 36"  FUNCTIONAL TESTS:  04/22/2022 Sit to/from stand completed without UE support and displayed weight shift Rt throughout movement  GAIT: 04/22/2022 Distance walked: 100' Assistive device utilized: None Level of assistance: Complete Independence Comments: antalgic pattern with reduced stance time on Lt   TODAY'S TREATMENT  05/25/22:  Therex: UBE LE only 4.5 lvl 8 mins  HS curls: 35#  2x10  Knee extension 20# 2x10  Leg press 45* BLE 112# 2 x 15, single leg 62# 2 x 15 LLE, cues for controlled  and slowed eccentric Supine bridges x 15 with 3 sec hold Sitting pelvic tilts x 10 with cueing for posture and abdominal contraction Heel taps 4" step 2x10 with fatigue reported Lunges onto BOSU ball 2x10 with L LE only.    05/21/2022: Therex: UBE LE only 4.5 lvl 8 mins  Leg press 45* BLE 112# 2 x 15, single leg 62# 2 x 15 LLE, cues for controlled and slowed eccentric Supine bridges x 15 with 3 sec hold Bird dogs x 8 with pt reported cramp, numbness and tingling in medial Lt ankle region Sitting pelvic tilts x 10 with cueing for posture and abdominal contraction PT reviewed standing lumbar extension for use during work day and biking at home with moderate exertion, pt verbalized understanding  Self Care: PT advised drinking more water throughout day and consuming foods with more electrolytes, including eating breakfast prior to work and PT and pt verbalized understanding   05/14/2022: Therex: UBE LE only 4.5 lvl 8 mins  Standing lumbar extension x 5 (review for home use) Leg press 45* BLE 100# x 15, single leg 56# 2 x 15 performed bilateral, cues for controlled and slowed eccentric Supine hamstring stretch c strap 15 sec x 3 Lt  Supine Thomas stretch off side of table 15 sec x 3 Lt Seated hamstring stretch 15 sec x 3 Lt (cues to keep back still0 Seated SLR 2 x 10 bilateral   PATIENT EDUCATION:  04/22/2022 Education details: [email or web address] Person educated: Patient Education method: Explanation, Demonstration, Tactile cues, Verbal cues, and Handouts Education comprehension: verbalized understanding, returned demonstration, verbal cues required, tactile cues required, and needs further education   HOME EXERCISE PROGRAM: Access Code: [email or web address] URL: [email or web address] Date: 05/14/2022 Prepared by: Scot Jun  Exercises - Seated Multifidi Isometric  - 1-2 x daily - 7 x weekly - 1 sets - 10 reps - 5-10 hold - Seated Straight Leg Heel Taps  - 1-2 x daily - 7 x weekly -  1-2 sets - 10-15 reps - 2-3 hold - Seated Abdominal Press into [employer]  - 1-2 x daily - 7 x weekly - 1 sets - 10 reps - 5-10 hold - Sit to Stand  - 1-2 x daily - 7 x weekly - 2-3 sets - 10-15 reps - Standing Lumbar Extension with Counter  - 3-5 x daily - 7 x weekly - 1 sets - 5-10 reps - Supine Hamstring Stretch with Strap (Mirrored)  - 2-3 x daily - 7 x weekly - 1 sets - 5 reps - 30 hold - Supine Dynamic Modified Thomas Quad and Hip Flexor Dynamic Stretch  - 2-3 x daily - 7 x weekly - 1 sets - 3 reps - 15 hold - Seated Hamstring Stretch (Mirrored)  - 1-2 x daily - 7 x weekly - 1 sets - 3-5 reps - 15 hold  ASSESSMENT:  CLINICAL IMPRESSION: Pt continues to report cramping in L LE with standing activities. He tolerated all progressions targeting  L quad well today with no reported pain, but increased fatigue in L quad. Pt continues to demonstrate difficulty with eccentric control despite cues. He will continue to benefit from skilled PT to address outstanding deficits in L LE.   OBJECTIVE IMPAIRMENTS Abnormal gait, decreased activity tolerance, decreased mobility, decreased ROM, decreased strength, impaired sensation, and pain.   ACTIVITY LIMITATIONS carrying, lifting, bending,  sitting, standing, squatting, stairs, and dressing  PARTICIPATION LIMITATIONS: community activity and occupation  PERSONAL FACTORS Time since onset of injury/illness/exacerbation and 1 comorbidity: see PMH  are also affecting patient's functional outcome.   REHAB POTENTIAL: Good  CLINICAL DECISION MAKING: Stable/uncomplicated  EVALUATION COMPLEXITY: Low   GOALS: Goals reviewed with patient? Yes  SHORT TERM GOALS: Target date: 05/13/2022  Patient will be independent with HEP to continue progressing outside of clinic. Baseline: see objective data Goal status: MET 05/14/2022   LONG TERM GOALS: Target date: 06/17/2022  Patient will score FOTO >/= 57% to reflect improved self-perceived functional  ability Baseline: see objective data Goal status: on going - assessed 05/14/2022   2.  Patient will be independent with a HEP to maintain and progress functional gains from skilled PT. Baseline: see objective data Goal status: on going - assessed 05/14/2022   3.  Patient will report </= 2/10 pain with sitting, standing, and walking activities. Baseline: see objective data Goal status: on going - assessed 05/14/2022   4. Patient presents with lumbar extension WNL without increases in pain intensity or peripheralization to allow for upright posture in sitting and standing, promoting normal functional movement ability. Baseline: see objective data Goal status: on going - assessed 05/14/2022  5.  Patient has 5/5 MMT bilateral hip and knee strength to represent appropriate strength needed for functional activities including community activity, occupation, and household requirements. Baseline: see objective data Goal status: on going - assessed 05/14/2022  6.  Patient verbalizes and demonstrates ability to perform duties for occupation and ADLs, including prolonged standing, lifting, and dressing. Baseline: see objective data Goal status: on going - assessed 05/14/2022   PLAN: PT FREQUENCY: 1-2x/week  PT DURATION: 8 weeks  PLANNED INTERVENTIONS: Therapeutic exercises, Therapeutic activity, Neuromuscular re-education, Balance training, Gait training, Patient/Family education, Self Care, Joint mobilization, Stair training, Aquatic Therapy, Dry Needling, Electrical stimulation, Cryotherapy, Moist heat, Ultrasound, Ionotophoresis 20m/ml Dexamethasone, Manual therapy, Re-evaluation, and physical performance tests .  PLAN FOR NEXT SESSION: strength measurements, continue core/lumbar and LE strengthening   SLynden Ang PT 05/25/2022, 12:28 PM

## 2022-05-27 ENCOUNTER — Ambulatory Visit (INDEPENDENT_AMBULATORY_CARE_PROVIDER_SITE_OTHER): Payer: BC Managed Care – PPO | Admitting: Rehabilitative and Restorative Service Providers"

## 2022-05-27 ENCOUNTER — Encounter: Payer: Self-pay | Admitting: Rehabilitative and Restorative Service Providers"

## 2022-05-27 DIAGNOSIS — M5459 Other low back pain: Secondary | ICD-10-CM

## 2022-05-27 DIAGNOSIS — R2689 Other abnormalities of gait and mobility: Secondary | ICD-10-CM | POA: Diagnosis not present

## 2022-05-27 DIAGNOSIS — M6281 Muscle weakness (generalized): Secondary | ICD-10-CM | POA: Diagnosis not present

## 2022-05-27 DIAGNOSIS — R293 Abnormal posture: Secondary | ICD-10-CM | POA: Diagnosis not present

## 2022-05-27 NOTE — Therapy (Signed)
OUTPATIENT PHYSICAL THERAPY TREATMENT   Patient Name: Isaac Fletcher MRN: [id number] DOB:[birth year], 41 y.o., male Today's Date: 05/27/2022  End of Session  PT End of Session - 05/27/22 1153     Visit Number 8    Number of Visits 18    Date for PT Re-Evaluation 06/17/22    Authorization Type BCBS    Authorization Time Period $20 COPAY, 30 PT VISITS    PT Start Time 1146    PT Stop Time 1224    PT Time Calculation (min) 38 min    Activity Tolerance Patient tolerated treatment well    Behavior During Therapy WFL for tasks assessed/performed               Past Medical History:  Diagnosis Date   Back pain    Constipation    H/O hernia repair    Past Surgical History:  Procedure Laterality Date   HERNIA REPAIR     Herniated Disc     LUMBAR LAMINECTOMY N/A 03/21/2021   Procedure: LEFT LEFT FOUR-FIVE MICRODISCECTOMY;  Surgeon: Marybelle Killings, MD;  Location: [facility];  Service: Orthopedics;  Laterality: N/A;   LUMBAR LAMINECTOMY/DECOMPRESSION MICRODISCECTOMY N/A 02/16/2022   Procedure: LEFT FAR LATERAL APPROACH TO REMOVE LEFT [name] DISC HERNIATION;  Surgeon: Jessy Oto, MD;  Location: [facility];  Service: Orthopedics;  Laterality: N/A;   Patient Active Problem List   Diagnosis Date Noted   Status post lumbar laminectomy 02/16/2022   Status post lumbar microdiscectomy 04/08/2021   Surgery, elective    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   Lumbar foraminal stenosis 02/19/2021   Trochanteric bursitis, left hip 02/19/2021    PCP: Wardell Honour, MD  REFERRING PROVIDER: Jessy Oto, MD  REFERRING DIAG: [phone] (ICD-10-CM) - S/P lumbar microdiscectomy M54.16 (ICD-10-CM) - Lumbar radiculopathy M79.2 (ICD-10-CM) - Neurogenic pain  Rationale for Evaluation and Treatment Rehabilitation  THERAPY DIAG:  Other low back pain  Muscle weakness (generalized)  Other abnormalities of gait and mobility  Abnormal posture  ONSET DATE: 02/16/22 lumbar  microdiscectomy  SUBJECTIVE:                                                                                                                                                                                           SUBJECTIVE STATEMENT: Pt indicated he forgot to take medicine yesterday.  Reported this morning having some "electrical" feelings in Lt leg at times.  Otherwise reported feeling good today.   PERTINENT HISTORY:  Lumbar microdiscectomy 02/16/2022, 03/21/2021. Hx of LLE weakness and neurogenic pain Previous PT 09/2021 - 11/2021  PAIN:  NPRS scale: 0/10 Pain  location: left low back/ Lt thigh Pain description: pressure Aggravating factors: standing for extended periods of time, highest pain in mornings Relieving factors: sleeping on his back, movement  NPRS scale: 0/10 upon arrival Pain location: Lt knee Pain description: pressure Aggravating factors: standing for extended periods of time Relieving factors: sitting and laying down   PRECAUTIONS: Previous MD note - avoid frequent bending and stooping, no lifting greater than 10 lbs  WEIGHT BEARING RESTRICTIONS No  FALLS:  Has patient fallen in last 6 months? Yes. 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   04/22/2022 - none performed today due to time   POSTURE:  04/22/2022 Standing - anterior pelvic tilt and weight shift right  LUMBAR ROM:   Active  AROM  04/22/2022 AROM 05/08/2022  Flexion  To mid shin with some pain  Extension 75% WNL with no pain  Right lateral flexion To lateral knee, some pain in Lt low back To lateral knee, some pain in Lt low back  Left lateral flexion To lateral knee, some pain in Lt low back To lateral mid thigh, some pain in Lt low back  Right rotation    Left rotation     (Blank rows = not tested)  LOWER EXTREMITY ROM:     ROM (A: AROM, P: PROM)  Right 04/22/2022 Left 04/22/2022  Hip flexion    Hip extension    Hip abduction    Hip adduction    Hip internal rotation    Hip external rotation    Knee flexion    Knee extension    Ankle dorsiflexion    Ankle plantarflexion    Ankle inversion    Ankle eversion     (Blank rows = not tested)  LOWER EXTREMITY MMT:    MMT Right Eval 04/22/22 seated Left Eval 04/22/22 seated Left 05/08/22 Right 05/27/2022 Left 05/27/2022  Hip flexion 4/5 3+/5 4/5 5/5 5/5  Hip extension       Hip abduction       Hip adduction       Hip internal rotation       Hip external rotation       Knee flexion 4/5 4/5  5/5 5/5  Knee extension 4/5 3+/5 4/5 5/5 4+/5  Ankle dorsiflexion 4/5 5/5     Ankle plantarflexion       Ankle inversion       Ankle eversion        (Blank rows = not tested)  LEG LENGTH MEASUREMENTS: 05/21/22 RLE 35.75" LLE 36"  FUNCTIONAL TESTS:  04/22/2022 Sit to/from stand completed without UE support and displayed weight shift Rt throughout movement  GAIT: 04/22/2022 Distance walked: 100' Assistive device utilized: None Level of assistance: Complete Independence Comments:  antalgic pattern with reduced stance time on Lt   TODAY'S TREATMENT  05/27/2022: Therex: Recumbent bike lvl 4 8 mins, seat 8 Leg extension machine  Double leg up, Lt leg lowering eccentric focus 10 lbs 2 x 10 Prone opposite arm/leg lift small arc off table 3 sec hold x 10 bilateral Supine lumbar trunk rotation stretch 5 sec x 3 bilateral  Supine hip adductor stretch butterfly 15 sec x 3 bilateral Supine bridge 5 sec hold x 10 Leg press double leg (flat back on machine ) 112 lbs x 15, single leg x 15 62 lbs slow lowering focus Standing lumbar extension x 5 to tolerance    05/25/22:  Therex: UBE LE only 4.5 lvl 8 mins  HS curls: 35# 2x10  Knee extension 20# 2x10  Leg press 45* BLE 112# 2 x 15, single leg 62# 2 x 15 LLE, cues for controlled and slowed eccentric Supine bridges x 15 with 3 sec hold Sitting pelvic tilts x 10 with cueing for posture and abdominal contraction Heel taps 4" step 2x10 with fatigue reported Lunges onto BOSU ball 2x10 with L LE only.    05/21/2022: Therex: UBE LE only 4.5 lvl 8 mins  Leg press 45* BLE 112# 2 x 15, single leg 62# 2 x 15 LLE, cues for controlled and slowed eccentric Supine bridges x 15 with 3 sec hold Bird dogs x 8 with pt reported cramp, numbness and tingling in medial Lt ankle region Sitting pelvic tilts x 10 with cueing for posture and abdominal contraction PT reviewed standing lumbar extension for use during work day and biking at home with moderate exertion, pt verbalized understanding  Self Care: PT advised drinking more water throughout day and consuming foods with more electrolytes, including eating breakfast prior to work and PT and pt verbalized understanding     PATIENT EDUCATION:  04/22/2022 Education details: [email or web address] Person educated: Patient Education method: Explanation, Demonstration, Tactile cues, Verbal cues, and Handouts Education comprehension: verbalized understanding, returned demonstration, verbal cues required, tactile cues required, and needs further education   HOME EXERCISE PROGRAM: Access Code: [email or web address] URL: [email or web address] Date:  05/14/2022 Prepared by: Scot Jun  Exercises - Seated Multifidi Isometric  - 1-2 x daily - 7 x weekly - 1 sets - 10 reps - 5-10 hold - Seated Straight Leg Heel Taps  - 1-2 x daily - 7 x weekly - 1-2 sets - 10-15 reps - 2-3 hold - Seated Abdominal Press into [employer]  - 1-2 x daily - 7 x weekly - 1 sets - 10 reps - 5-10 hold - Sit to Stand  - 1-2 x daily - 7 x weekly - 2-3 sets - 10-15 reps - Standing Lumbar Extension with Counter  - 3-5 x daily - 7 x weekly - 1 sets - 5-10 reps - Supine Hamstring Stretch with Strap (Mirrored)  - 2-3 x daily - 7 x weekly - 1 sets - 5 reps - 30 hold - Supine Dynamic Modified Thomas Quad and Hip Flexor Dynamic Stretch  - 2-3 x daily - 7 x weekly - 1 sets - 3 reps - 15 hold - Seated Hamstring Stretch (Mirrored)  - 1-2 x daily - 7 x weekly - 1 sets - 3-5 reps - 15 hold  ASSESSMENT:  CLINICAL IMPRESSION: Strength improvements noted in testing but Lt leg could continue to to improve to improve tolerance to standing activity.  Pt did have a complaint of medial thigh pain/pulling noted in rotation stretching.  Possible hip adductor  tightness noted in area.  Will plan for monitoring for any carry over between today and next visit.    OBJECTIVE IMPAIRMENTS Abnormal gait, decreased activity tolerance, decreased mobility, decreased ROM, decreased strength, impaired sensation, and pain.   ACTIVITY LIMITATIONS carrying, lifting, bending, sitting, standing, squatting, stairs, and dressing  PARTICIPATION LIMITATIONS: community activity and occupation  PERSONAL FACTORS Time since onset of injury/illness/exacerbation and 1 comorbidity: see PMH  are also affecting patient's functional outcome.   REHAB POTENTIAL: Good  CLINICAL DECISION MAKING: Stable/uncomplicated  EVALUATION COMPLEXITY: Low   GOALS: Goals reviewed with patient? Yes  SHORT TERM GOALS: Target date: 05/13/2022  Patient will be independent with HEP to continue progressing outside of  clinic. Baseline: see objective data Goal status: MET 05/14/2022   LONG TERM GOALS: Target date: 06/17/2022  Patient will score FOTO >/= 57% to reflect improved self-perceived functional ability Baseline: see objective data Goal status: on going - assessed 05/14/2022   2.  Patient will be independent with a HEP to maintain and progress functional gains from skilled PT. Baseline: see objective data Goal status: on going - assessed 05/14/2022   3.  Patient will report </= 2/10 pain with sitting, standing, and walking activities. Baseline: see objective data Goal status: on going - assessed 05/14/2022   4. Patient presents with lumbar extension WNL without increases in pain intensity or peripheralization to allow for upright posture in sitting and standing, promoting normal functional movement ability. Baseline: see objective data Goal status: on going - assessed 05/14/2022  5.  Patient has 5/5 MMT bilateral hip and knee strength to represent appropriate strength needed for functional activities including community activity, occupation, and household requirements. Baseline: see objective data Goal status: on going - assessed 05/14/2022  6.  Patient verbalizes and demonstrates ability to perform duties for occupation and ADLs, including prolonged standing, lifting, and dressing. Baseline: see objective data Goal status: on going - assessed 05/14/2022   PLAN: PT FREQUENCY: 1-2x/week  PT DURATION: 8 weeks  PLANNED INTERVENTIONS: Therapeutic exercises, Therapeutic activity, Neuromuscular re-education, Balance training, Gait training, Patient/Family education, Self Care, Joint mobilization, Stair training, Aquatic Therapy, Dry Needling, Electrical stimulation, Cryotherapy, Moist heat, Ultrasound, Ionotophoresis 28m/ml Dexamethasone, Manual therapy, Re-evaluation, and physical performance tests .  PLAN FOR NEXT SESSION: Progressive core/LE strengthening as tolerated to improve function and tolerance  to activity, particularly at work.    MScot Jun PT, DPT, OCS, ATC 05/27/22  12:23 PM

## 2022-05-31 NOTE — Progress Notes (Signed)
Office Visit Note   Patient: Isaac Fletcher           Date of Birth: 22-Jul-1981           MRN: [id number] Visit Date: 12/24/2021              Requested by: Wardell Honour, [place],  [place] [address] PCP: Pcp, No   Assessment & Plan: Visit Diagnoses:  1. Radiculopathy, lumbar region     I reviewed MRI with patient.  Went to follow-up with Dr. Louanne Skye at in a couple weeks for recheck to review the study and discuss possible surgical intervention for the far lateral disc protrusion.  This is answered.  Follow-Up Instructions: Return in about 2 weeks (around 01/07/2022) for WITH DR NITKA TO DISCUSS POSSIBLE SURGERY FOR LUMBAR FAR LATERAL [place] (PER YATES/Latrease Kunde).   Orders:  No orders of the defined types were placed in this encounter.  No orders of the defined types were placed in this encounter.     Procedures: No procedures performed   Clinical Data: No additional findings.   Subjective: Chief Complaint  Patient presents with   Lower Back - Follow-up    HPI Patient returns for MRI review.    Objective: Vital Signs: There were no vitals taken for this visit.  Physical Exam  Ortho Exam  Specialty Comments:  MRI LUMBAR SPINE WITHOUT AND WITH CONTRAST     TECHNIQUE:  Multiplanar and multiecho pulse sequences of the lumbar spine were  obtained without and with intravenous contrast.     CONTRAST:  7mL MULTIHANCE GADOBENATE DIMEGLUMINE 529 MG/ML IV SOLN     COMPARISON:  MRI of the lumbar spine November 14, 2020.     FINDINGS:  Segmentation: Standard segmentation is seen. The inferior-most fully  formed intervertebral disc is labeled L5-S1. Same numbering as on  the prior MRI.     Alignment: Straightening of the normal lumbar lordosis. No  substantial sagittal subluxation.     Vertebrae: Congenitally short pedicles. Vertebral body heights are  maintained. No focal marrow edema to suggest acute fracture or  discitis/osteomyelitis.      Conus medullaris and cauda equina: Conus extends to the L2 level.  Conus and cauda equina appear normal. No abnormal enhancement of the  conus or cauda equina.     Paraspinal and other soft tissues: Postoperative changes in the  lower left lumbar spine. Otherwise, unremarkable.     Disc levels:     T12-L1: No significant disc protrusion, foraminal stenosis, or canal  stenosis.     L1-L2: No significant disc protrusion, foraminal stenosis, or canal  stenosis.     L2-L3: No significant disc protrusion, foraminal stenosis, or canal  stenosis.     L3-L4: No significant disc protrusion, foraminal stenosis, or canal  stenosis.     L4-L5: Disc height loss and desiccation. Broad disc bulge. Enhancing  scar tissue in the posterolateral left canal and left foramen,  compatible with sequela interval micro discectomy. This scar tissue  limits assessment; however, left foraminal stenosis is likely  improved without evidence of recurrent disc herniation. No  significant canal stenosis. Similar moderate right foraminal  stenosis.     L5-S1: Mild disc bulging with moderate right and mild left foraminal  stenosis. No significant canal stenosis.     IMPRESSION:  1. Interval left L4-L5 micro discectomy. Scar tissue in the left  foramen and lateral canal limits assessment, but suspect improved  foraminal stenosis  without evidence of recurrent herniation. Similar  moderate right foraminal stenosis at this level.  2. At L5-S1, similar moderate right and mild left foraminal  stenosis.        Electronically Signed    By: Feliberto Harts M.D.    On: 07/07/2021 12:07  Imaging: No results found.   PMFS History: Patient Active Problem List   Diagnosis Date Noted   Status post lumbar laminectomy 02/16/2022   Status post lumbar microdiscectomy 04/08/2021   Surgery, elective    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   Lumbar foraminal stenosis 02/19/2021   Trochanteric  bursitis, left hip 02/19/2021   Past Medical History:  Diagnosis Date   Back pain    Constipation    H/O hernia repair     Family History  Problem Relation Age of Onset   Diabetes Father     Past Surgical History:  Procedure Laterality Date   HERNIA REPAIR     Herniated Disc     LUMBAR LAMINECTOMY N/A 03/21/2021   Procedure: LEFT LEFT FOUR-FIVE MICRODISCECTOMY;  Surgeon: [name], MD;  Location: [facility];  Service: Orthopedics;  Laterality: N/A;   LUMBAR LAMINECTOMY/DECOMPRESSION MICRODISCECTOMY N/A 02/16/2022   Procedure: LEFT FAR LATERAL APPROACH TO REMOVE LEFT [name] DISC HERNIATION;  Surgeon: Kerrin Champagne, MD;  Location: [facility] OR;  Service: Orthopedics;  Laterality: N/A;   Social History   Occupational History   Not on file  Tobacco Use   Smoking status: Never   Smokeless tobacco: Never  Vaping Use   Vaping Use: Never used  Substance and Sexual Activity   Alcohol use: Yes   Drug use: Never   Sexual activity: Not on file

## 2022-06-01 NOTE — Therapy (Deleted)
OUTPATIENT PHYSICAL THERAPY TREATMENT   Patient Name: Isaac Fletcher MRN: [id number] DOB:Aug 14, 1981, 41 y.o., male Today's Date: 06/01/2022  End of Session      Past Medical History:  Diagnosis Date   Back pain    Constipation    H/O hernia repair    Past Surgical History:  Procedure Laterality Date   HERNIA REPAIR     Herniated Disc     LUMBAR LAMINECTOMY N/A 03/21/2021   Procedure: LEFT LEFT FOUR-FIVE MICRODISCECTOMY;  Surgeon: Marybelle Killings, MD;  Location: [facility];  Service: Orthopedics;  Laterality: N/A;   LUMBAR LAMINECTOMY/DECOMPRESSION MICRODISCECTOMY N/A 02/16/2022   Procedure: LEFT FAR LATERAL APPROACH TO REMOVE LEFT [name] DISC HERNIATION;  Surgeon: Jessy Oto, MD;  Location: [facility];  Service: Orthopedics;  Laterality: N/A;   Patient Active Problem List   Diagnosis Date Noted   Status post lumbar laminectomy 02/16/2022   Status post lumbar microdiscectomy 04/08/2021   Surgery, elective    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   Lumbar foraminal stenosis 02/19/2021   Trochanteric bursitis, left hip 02/19/2021    PCP: Wardell Honour, MD  REFERRING PROVIDER: Jessy Oto, MD  REFERRING DIAG: [phone] (ICD-10-CM) - S/P lumbar microdiscectomy M54.16 (ICD-10-CM) - Lumbar radiculopathy M79.2 (ICD-10-CM) - Neurogenic pain  Rationale for Evaluation and Treatment Rehabilitation  THERAPY DIAG:  No diagnosis found.  ONSET DATE: 02/16/22 lumbar microdiscectomy  SUBJECTIVE:                                                                                                                                                                                           SUBJECTIVE STATEMENT: Pt indicated he forgot to take medicine yesterday.  Reported this morning having some "electrical" feelings in Lt leg at times.  Otherwise reported feeling good today.   PERTINENT HISTORY:  Lumbar microdiscectomy 02/16/2022, 03/21/2021. Hx of LLE weakness and neurogenic  pain Previous PT 09/2021 - 11/2021  PAIN:  NPRS scale: 0/10 Pain location: left low back/ Lt thigh Pain description: pressure Aggravating factors: standing for extended periods of time, highest pain in mornings Relieving factors: sleeping on his back, movement  NPRS scale: 0/10 upon arrival Pain location: Lt knee Pain description: pressure Aggravating factors: standing for extended periods of time Relieving factors: sitting and laying down   PRECAUTIONS: Previous MD note - avoid frequent bending and stooping, no lifting greater than 10 lbs  WEIGHT BEARING RESTRICTIONS No  FALLS:  Has patient fallen in last 6 months? Yes. 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   04/22/2022 - none performed today due to time   POSTURE:  04/22/2022 Standing - anterior pelvic tilt and weight shift right  LUMBAR ROM:   Active  AROM  04/22/2022 AROM 05/08/2022  Flexion  To mid shin with some pain  Extension 75% WNL with no pain  Right lateral flexion To lateral knee, some pain in Lt low back To lateral knee, some pain in Lt low back  Left lateral flexion To lateral knee, some pain in Lt low back To lateral mid  thigh, some pain in Lt low back  Right rotation    Left rotation     (Blank rows = not tested)  LOWER EXTREMITY ROM:     ROM (A: AROM, P: PROM)  Right 04/22/2022 Left 04/22/2022  Hip flexion    Hip extension    Hip abduction    Hip adduction    Hip internal rotation    Hip external rotation    Knee flexion    Knee extension    Ankle dorsiflexion    Ankle plantarflexion    Ankle inversion    Ankle eversion     (Blank rows = not tested)  LOWER EXTREMITY MMT:    MMT Right Eval 04/22/22 seated Left Eval 04/22/22 seated Left 05/08/22 Right 05/27/2022 Left 05/27/2022  Hip flexion 4/5 3+/5 4/5 5/5 5/5  Hip extension       Hip abduction       Hip adduction       Hip internal rotation       Hip external rotation       Knee flexion 4/5 4/5  5/5 5/5  Knee extension 4/5 3+/5 4/5 5/5 4+/5  Ankle dorsiflexion 4/5 5/5     Ankle plantarflexion       Ankle inversion       Ankle eversion        (Blank rows = not tested)  LEG LENGTH MEASUREMENTS: 05/21/22 RLE 35.75" LLE 36"  FUNCTIONAL TESTS:  04/22/2022 Sit to/from stand completed without UE support and displayed weight shift Rt throughout movement  GAIT: 04/22/2022 Distance walked: 100' Assistive device utilized: None Level of assistance: Complete Independence Comments: antalgic pattern with reduced stance time on Lt   TODAY'S TREATMENT  05/27/2022: Therex: Recumbent bike lvl 4 8 mins, seat 8 Leg extension machine Double leg up, Lt leg lowering eccentric focus 10 lbs 2 x 10 Prone opposite arm/leg lift small arc off table 3 sec hold x 10 bilateral Supine lumbar trunk rotation stretch 5 sec x 3 bilateral  Supine hip adductor stretch butterfly 15 sec x 3 bilateral Supine bridge 5 sec hold x 10 Leg press double leg (flat back on machine ) 112 lbs x 15, single leg x 15 62 lbs slow lowering focus Standing lumbar extension x 5 to tolerance  05/25/22:  Therex: UBE LE only 4.5 lvl 8 mins  HS curls: 35# 2x10  Knee  extension 20# 2x10  Leg press 45* BLE 112# 2 x 15, single leg 62# 2 x 15 LLE, cues for controlled and slowed eccentric Supine bridges x 15 with 3 sec hold Sitting pelvic tilts x 10 with cueing for posture and abdominal contraction Heel taps 4" step 2x10 with fatigue reported Lunges onto BOSU ball 2x10 with L LE only.    05/21/2022: Therex: UBE LE only 4.5 lvl 8 mins  Leg press 45* BLE 112# 2 x 15, single leg 62# 2 x 15 LLE, cues for controlled and slowed eccentric Supine bridges x 15 with 3 sec hold Bird dogs x 8 with pt reported cramp, numbness and tingling in medial Lt ankle region Sitting pelvic tilts x 10 with cueing for posture and abdominal contraction PT reviewed standing lumbar extension for use during work day and biking at home with moderate exertion, pt verbalized understanding  Self Care: PT advised drinking more water throughout day and consuming foods with more electrolytes, including eating breakfast prior to work and PT and pt verbalized understanding     PATIENT EDUCATION:  04/22/2022 Education details: [email or web address] Person educated: Patient Education method: Explanation, Demonstration, Tactile cues, Verbal cues, and Handouts Education comprehension: verbalized understanding, returned demonstration, verbal cues required, tactile cues required, and needs further education   HOME EXERCISE PROGRAM: Access Code: [email or web address] URL: [email or web address] Date: 05/14/2022 Prepared by: Scot Jun  Exercises - Seated Multifidi Isometric  - 1-2 x daily - 7 x weekly - 1 sets - 10 reps - 5-10 hold - Seated Straight Leg Heel Taps  - 1-2 x daily - 7 x weekly - 1-2 sets - 10-15 reps - 2-3 hold - Seated Abdominal Press into [employer]  - 1-2 x daily - 7 x weekly - 1 sets - 10 reps - 5-10 hold - Sit to Stand  - 1-2 x daily - 7 x weekly - 2-3 sets - 10-15 reps - Standing Lumbar Extension with Counter  - 3-5 x daily - 7 x weekly - 1 sets - 5-10 reps - Supine Hamstring  Stretch with Strap (Mirrored)  - 2-3 x daily - 7 x weekly - 1 sets - 5 reps - 30 hold - Supine Dynamic Modified Thomas Quad and Hip Flexor Dynamic Stretch  - 2-3 x daily - 7 x weekly - 1 sets - 3 reps - 15 hold - Seated Hamstring Stretch (Mirrored)  - 1-2 x daily - 7 x weekly - 1 sets - 3-5 reps - 15 hold  ASSESSMENT:  CLINICAL IMPRESSION: Strength improvements noted in testing but Lt leg could continue to to improve to improve tolerance to standing activity.  Pt did have a complaint of medial thigh pain/pulling noted in rotation stretching.  Possible hip adductor tightness noted in area.  Will plan for monitoring for any carry over between today and next visit.    OBJECTIVE IMPAIRMENTS Abnormal gait, decreased activity tolerance, decreased mobility, decreased ROM, decreased strength, impaired sensation, and pain.   ACTIVITY LIMITATIONS carrying, lifting, bending, sitting, standing, squatting, stairs, and dressing  PARTICIPATION LIMITATIONS: community activity and occupation  PERSONAL FACTORS Time since onset of injury/illness/exacerbation and 1 comorbidity: see PMH  are also affecting patient's functional outcome.   REHAB POTENTIAL: Good  CLINICAL DECISION MAKING: Stable/uncomplicated  EVALUATION COMPLEXITY: Low   GOALS: Goals reviewed with patient? Yes  SHORT TERM GOALS: Target date: 05/13/2022  Patient will be independent with  HEP to continue progressing outside of clinic. Baseline: see objective data Goal status: MET 05/14/2022   LONG TERM GOALS: Target date: 06/17/2022  Patient will score FOTO >/= 57% to reflect improved self-perceived functional ability Baseline: see objective data Goal status: on going - assessed 05/14/2022   2.  Patient will be independent with a HEP to maintain and progress functional gains from skilled PT. Baseline: see objective data Goal status: on going - assessed 05/14/2022   3.  Patient will report </= 2/10 pain with sitting, standing, and walking  activities. Baseline: see objective data Goal status: on going - assessed 05/14/2022   4. Patient presents with lumbar extension WNL without increases in pain intensity or peripheralization to allow for upright posture in sitting and standing, promoting normal functional movement ability. Baseline: see objective data Goal status: on going - assessed 05/14/2022  5.  Patient has 5/5 MMT bilateral hip and knee strength to represent appropriate strength needed for functional activities including community activity, occupation, and household requirements. Baseline: see objective data Goal status: on going - assessed 05/14/2022  6.  Patient verbalizes and demonstrates ability to perform duties for occupation and ADLs, including prolonged standing, lifting, and dressing. Baseline: see objective data Goal status: on going - assessed 05/14/2022   PLAN: PT FREQUENCY: 1-2x/week  PT DURATION: 8 weeks  PLANNED INTERVENTIONS: Therapeutic exercises, Therapeutic activity, Neuromuscular re-education, Balance training, Gait training, Patient/Family education, Self Care, Joint mobilization, Stair training, Aquatic Therapy, Dry Needling, Electrical stimulation, Cryotherapy, Moist heat, Ultrasound, Ionotophoresis 74m/ml Dexamethasone, Manual therapy, Re-evaluation, and physical performance tests .  PLAN FOR NEXT SESSION: Progressive core/LE strengthening as tolerated to improve function and tolerance to activity, particularly at work.   SRudi HeapPT, DPT 06/01/22  2:58 PM

## 2022-06-03 ENCOUNTER — Encounter: Payer: BC Managed Care – PPO | Admitting: Physical Therapy

## 2022-06-03 NOTE — Therapy (Signed)
OUTPATIENT PHYSICAL THERAPY TREATMENT   Patient Name: RIKU BUTTERY MRN: [id number] DOB:1980/11/24, 41 y.o., male Today's Date: 06/05/2022  End of Session  PT End of Session - 06/05/22 1008     Visit Number 9    Number of Visits 18    Date for PT Re-Evaluation 06/17/22    Authorization Type BCBS    Authorization Time Period $20 COPAY, 30 PT VISITS    PT Start Time 1009    PT Stop Time 1050    PT Time Calculation (min) 41 min    Activity Tolerance Patient tolerated treatment well    Behavior During Therapy WFL for tasks assessed/performed                Past Medical History:  Diagnosis Date   Back pain    Constipation    H/O hernia repair    Past Surgical History:  Procedure Laterality Date   HERNIA REPAIR     Herniated Disc     LUMBAR LAMINECTOMY N/A 03/21/2021   Procedure: LEFT LEFT FOUR-FIVE MICRODISCECTOMY;  Surgeon: Marybelle Killings, MD;  Location: [facility];  Service: Orthopedics;  Laterality: N/A;   LUMBAR LAMINECTOMY/DECOMPRESSION MICRODISCECTOMY N/A 02/16/2022   Procedure: LEFT FAR LATERAL APPROACH TO REMOVE LEFT [name] DISC HERNIATION;  Surgeon: Jessy Oto, MD;  Location: [place];  Service: Orthopedics;  Laterality: N/A;   Patient Active Problem List   Diagnosis Date Noted   Status post lumbar laminectomy 02/16/2022   Status post lumbar microdiscectomy 04/08/2021   Surgery, elective    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   Lumbar foraminal stenosis 02/19/2021   Trochanteric bursitis, left hip 02/19/2021    PCP: Wardell Honour, MD  REFERRING PROVIDER: Jessy Oto, MD  REFERRING DIAG: [phone] (ICD-10-CM) - S/P lumbar microdiscectomy M54.16 (ICD-10-CM) - Lumbar radiculopathy M79.2 (ICD-10-CM) - Neurogenic pain  Rationale for Evaluation and Treatment Rehabilitation  THERAPY DIAG:  Muscle weakness (generalized)  Abnormal posture  Chronic low back pain without sciatica, unspecified back pain laterality  Other abnormalities of  gait and mobility  Other low back pain  ONSET DATE: 02/16/22 lumbar microdiscectomy  SUBJECTIVE:                                                                                                                                                                                           SUBJECTIVE STATEMENT: Pt states that he has been doing lateral presses into the wall which has helped. He denies any cramps recently though.   PERTINENT HISTORY:  Lumbar microdiscectomy 02/16/2022, 03/21/2021. Hx of LLE weakness and neurogenic pain Previous PT 09/2021 - 11/2021  PAIN:  NPRS scale: 0/10 Pain location: left low back/ Lt thigh Pain description: pressure Aggravating factors: standing for extended periods of time, highest pain in mornings Relieving factors: sleeping on his back, movement  NPRS scale: 0/10 upon arrival Pain location: Lt knee Pain description: pressure Aggravating factors: standing for extended periods of time Relieving factors: sitting and laying down   PRECAUTIONS: Previous MD note - avoid frequent bending and stooping, no lifting greater than 10 lbs  WEIGHT BEARING RESTRICTIONS No  FALLS:  Has patient fallen in last 6 months? Yes. 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   04/22/2022 - none performed today due to time   POSTURE:  04/22/2022 Standing - anterior pelvic tilt and weight shift right  LUMBAR ROM:   Active  AROM  04/22/2022 AROM 05/08/2022  Flexion  To mid shin with some pain  Extension 75% WNL with no pain  Right lateral flexion To lateral knee, some pain in Lt low back To lateral knee, some pain in Lt low back  Left lateral flexion To lateral knee, some pain in Lt low back To lateral mid thigh, some pain in Lt low back  Right rotation    Left rotation     (Blank rows = not tested)  LOWER EXTREMITY ROM:     ROM (A: AROM, P: PROM)  Right 04/22/2022 Left 04/22/2022  Hip flexion    Hip extension    Hip abduction    Hip adduction    Hip internal rotation    Hip external rotation    Knee flexion    Knee extension    Ankle dorsiflexion    Ankle plantarflexion    Ankle inversion    Ankle eversion     (Blank rows = not tested)  LOWER EXTREMITY MMT:    MMT Right Eval 04/22/22 seated Left Eval 04/22/22 seated Left 05/08/22 Right 05/27/2022 Left 05/27/2022  Hip flexion 4/5 3+/5 4/5 5/5 5/5  Hip extension       Hip abduction       Hip adduction       Hip internal rotation       Hip external rotation       Knee flexion 4/5 4/5  5/5 5/5  Knee extension 4/5 3+/5 4/5 5/5 4+/5  Ankle dorsiflexion 4/5 5/5     Ankle plantarflexion       Ankle inversion       Ankle eversion        (Blank rows = not tested)  LEG LENGTH MEASUREMENTS: 05/21/22 RLE 35.75" LLE 36"  FUNCTIONAL TESTS:  04/22/2022 Sit to/from stand completed without UE support and displayed weight shift Rt throughout movement  GAIT: 04/22/2022 Distance walked: 100' Assistive device utilized: None  Level of assistance: Complete Independence Comments: antalgic pattern with reduced stance time on Lt   TODAY'S TREATMENT  06/05/2022: Therex: Recumbent bike lvl 4 8 mins, seat 8 Leg  extension machine Double leg up, Lt leg lowering eccentric focus 10 lbs 2 x 10 Supine bridge 5 sec hold x 10, BTB for increased glute activation Leg press double leg (flat back on machine ) 112 lbs x 15, single leg x 15 62 lbs slow lowering focus QL stretch supine with rotation in opp directions for R side only.  Sidelying reverse clams with BTB x15 Bilat  Quad stretch to R LE Theragun to R quad due to tension Leg length discrepancy test due to reports of R LE being shorter per MD.     05/27/2022: Therex: Recumbent bike lvl 4 8 mins, seat 8 Leg extension machine Double leg up, Lt leg lowering eccentric focus 10 lbs 2 x 10 Prone opposite arm/leg lift small arc off table 3 sec hold x 10 bilateral Supine lumbar trunk rotation stretch 5 sec x 3 bilateral  Supine hip adductor stretch butterfly 15 sec x 3 bilateral Supine bridge 5 sec hold x 10 Leg press double leg (flat back on machine ) 112 lbs x 15, single leg x 15 62 lbs slow lowering focus Standing lumbar extension x 5 to tolerance    05/25/22:  Therex: UBE LE only 4.5 lvl 8 mins  HS curls: 35# 2x10  Knee extension 20# 2x10  Leg press 45* BLE 112# 2 x 15, single leg 62# 2 x 15 LLE, cues for controlled and slowed eccentric Supine bridges x 15 with 3 sec hold Sitting pelvic tilts x 10 with cueing for posture and abdominal contraction Heel taps 4" step 2x10 with fatigue reported Lunges onto BOSU ball 2x10 with L LE only.    05/21/2022: Therex: UBE LE only 4.5 lvl 8 mins  Leg press 45* BLE 112# 2 x 15, single leg 62# 2 x 15 LLE, cues for controlled and slowed eccentric Supine bridges x 15 with 3 sec hold Bird dogs x 8 with pt reported cramp, numbness and tingling in medial Lt ankle region Sitting pelvic tilts x 10 with cueing for posture and abdominal contraction PT reviewed standing lumbar extension for use during work day and biking at home with moderate exertion, pt verbalized understanding  Self Care: PT advised drinking  more water throughout day and consuming foods with more electrolytes, including eating breakfast prior to work and PT and pt verbalized understanding     PATIENT EDUCATION:  04/22/2022 Education details: [email or web address] Person educated: Patient Education method: Explanation, Demonstration, Tactile cues, Verbal cues, and Handouts Education comprehension: verbalized understanding, returned demonstration, verbal cues required, tactile cues required, and needs further education   HOME EXERCISE PROGRAM: Access Code: [email or web address] URL: [email or web address] Date: 05/14/2022 Prepared by: Scot Jun  Exercises - Seated Multifidi Isometric  - 1-2 x daily - 7 x weekly - 1 sets - 10 reps - 5-10 hold - Seated Straight Leg Heel Taps  - 1-2 x daily - 7 x weekly - 1-2 sets - 10-15 reps - 2-3 hold - Seated Abdominal Press into [employer]  - 1-2 x daily - 7 x weekly - 1 sets - 10 reps - 5-10 hold - Sit to Stand  - 1-2 x daily - 7 x weekly - 2-3 sets - 10-15 reps - Standing Lumbar Extension with Counter  - 3-5 x daily - 7 x weekly - 1 sets - 5-10 reps - Supine  Hamstring Stretch with Strap (Mirrored)  - 2-3 x daily - 7 x weekly - 1 sets - 5 reps - 30 hold - Supine Dynamic Modified Thomas Quad and Hip Flexor Dynamic Stretch  - 2-3 x daily - 7 x weekly - 1 sets - 3 reps - 15 hold - Seated Hamstring Stretch (Mirrored)  - 1-2 x daily - 7 x weekly - 1 sets - 3-5 reps - 15 hold  ASSESSMENT:  CLINICAL IMPRESSION: Strength improvements noted in testing but Lt leg could continue to to improve to improve tolerance to standing activity. Pt states that his MD provided him a heel lift on the R side due to a leg length discrepancy. Pt with significant tension in R quad and R QL due to overcompensation for past 18 months. Pt reports improvements in symptoms after PT session today and demonstrates improved gait mechanics. Will plan for monitoring for any carry over between today and next visit.    OBJECTIVE  IMPAIRMENTS Abnormal gait, decreased activity tolerance, decreased mobility, decreased ROM, decreased strength, impaired sensation, and pain.   ACTIVITY LIMITATIONS carrying, lifting, bending, sitting, standing, squatting, stairs, and dressing  PARTICIPATION LIMITATIONS: community activity and occupation  PERSONAL FACTORS Time since onset of injury/illness/exacerbation and 1 comorbidity: see PMH  are also affecting patient's functional outcome.   REHAB POTENTIAL: Good  CLINICAL DECISION MAKING: Stable/uncomplicated  EVALUATION COMPLEXITY: Low   GOALS: Goals reviewed with patient? Yes  SHORT TERM GOALS: Target date: 05/13/2022  Patient will be independent with HEP to continue progressing outside of clinic. Baseline: see objective data Goal status: MET 05/14/2022   LONG TERM GOALS: Target date: 06/17/2022  Patient will score FOTO >/= 57% to reflect improved self-perceived functional ability Baseline: see objective data Goal status: on going - assessed 05/14/2022   2.  Patient will be independent with a HEP to maintain and progress functional gains from skilled PT. Baseline: see objective data Goal status: on going - assessed 05/14/2022   3.  Patient will report </= 2/10 pain with sitting, standing, and walking activities. Baseline: see objective data Goal status: on going - assessed 05/14/2022   4. Patient presents with lumbar extension WNL without increases in pain intensity or peripheralization to allow for upright posture in sitting and standing, promoting normal functional movement ability. Baseline: see objective data Goal status: on going - assessed 06/05/2022  5.  Patient has 5/5 MMT bilateral hip and knee strength to represent appropriate strength needed for functional activities including community activity, occupation, and household requirements. Baseline: see objective data Goal status: on going - assessed 05/14/2022  6.  Patient verbalizes and demonstrates ability to  perform duties for occupation and ADLs, including prolonged standing, lifting, and dressing. Baseline: see objective data Goal status: on going - assessed 05/14/2022   PLAN: PT FREQUENCY: 1-2x/week  PT DURATION: 8 weeks  PLANNED INTERVENTIONS: Therapeutic exercises, Therapeutic activity, Neuromuscular re-education, Balance training, Gait training, Patient/Family education, Self Care, Joint mobilization, Stair training, Aquatic Therapy, Dry Needling, Electrical stimulation, Cryotherapy, Moist heat, Ultrasound, Ionotophoresis 95m/ml Dexamethasone, Manual therapy, Re-evaluation, and physical performance tests .  PLAN FOR NEXT SESSION: Progressive core/LE strengthening as tolerated to improve function and tolerance to activity, particularly at work.   SRudi HeapPT, DPT 06/05/22  10:54 AM

## 2022-06-05 ENCOUNTER — Encounter: Payer: Self-pay | Admitting: Physical Therapy

## 2022-06-05 ENCOUNTER — Ambulatory Visit (INDEPENDENT_AMBULATORY_CARE_PROVIDER_SITE_OTHER): Payer: BC Managed Care – PPO | Admitting: Physical Therapy

## 2022-06-05 DIAGNOSIS — R293 Abnormal posture: Secondary | ICD-10-CM | POA: Diagnosis not present

## 2022-06-05 DIAGNOSIS — M6281 Muscle weakness (generalized): Secondary | ICD-10-CM | POA: Diagnosis not present

## 2022-06-05 DIAGNOSIS — R2689 Other abnormalities of gait and mobility: Secondary | ICD-10-CM | POA: Diagnosis not present

## 2022-06-05 DIAGNOSIS — M545 Low back pain, unspecified: Secondary | ICD-10-CM | POA: Diagnosis not present

## 2022-06-05 DIAGNOSIS — G8929 Other chronic pain: Secondary | ICD-10-CM

## 2022-06-05 DIAGNOSIS — M5459 Other low back pain: Secondary | ICD-10-CM

## 2022-06-08 ENCOUNTER — Telehealth: Payer: Self-pay | Admitting: Rehabilitative and Restorative Service Providers"

## 2022-06-08 ENCOUNTER — Encounter: Payer: BC Managed Care – PPO | Admitting: Rehabilitative and Restorative Service Providers"

## 2022-06-08 NOTE — Telephone Encounter (Signed)
Called with use of interpreter to follow up on missed appointment today.  Reminded of next appointment visit on Oct 4 at [place], PT, DPT, OCS, ATC 06/08/22  10:34 AM

## 2022-06-10 ENCOUNTER — Ambulatory Visit (INDEPENDENT_AMBULATORY_CARE_PROVIDER_SITE_OTHER): Payer: BC Managed Care – PPO | Admitting: Rehabilitative and Restorative Service Providers"

## 2022-06-10 ENCOUNTER — Encounter: Payer: Self-pay | Admitting: Rehabilitative and Restorative Service Providers"

## 2022-06-10 DIAGNOSIS — R293 Abnormal posture: Secondary | ICD-10-CM

## 2022-06-10 DIAGNOSIS — M6281 Muscle weakness (generalized): Secondary | ICD-10-CM

## 2022-06-10 DIAGNOSIS — R2689 Other abnormalities of gait and mobility: Secondary | ICD-10-CM | POA: Diagnosis not present

## 2022-06-10 DIAGNOSIS — M5459 Other low back pain: Secondary | ICD-10-CM

## 2022-06-10 NOTE — Therapy (Signed)
OUTPATIENT PHYSICAL THERAPY TREATMENT   Patient Name: Isaac Fletcher MRN: [id number] DOB:Jun 26, 1981, 41 y.o., male Today's Date: 06/10/2022  End of Session  PT End of Session - 06/10/22 1101     Visit Number 10    Number of Visits 18    Date for PT Re-Evaluation 06/17/22    Authorization Type BC[employer]    Authorization Time Period $20 COPAY, 30 PT VISITS    PT Start Time 6195    PT Stop Time 1056    PT Time Calculation (min) 41 min    Activity Tolerance Patient tolerated treatment well;No increased pain    Behavior During Therapy WFL for tasks assessed/performed             Past Medical History:  Diagnosis Date   Back pain    Constipation    H/O hernia repair    Past Surgical History:  Procedure Laterality Date   HERNIA REPAIR     Herniated Disc     LUMBAR LAMINECTOMY N/A 03/21/2021   Procedure: LEFT LEFT FOUR-FIVE MICRODISCECTOMY;  Surgeon: Marybelle Killings, MD;  Location: [place];  Service: Orthopedics;  Laterality: N/A;   LUMBAR LAMINECTOMY/DECOMPRESSION MICRODISCECTOMY N/A 02/16/2022   Procedure: LEFT FAR LATERAL APPROACH TO REMOVE LEFT [name] DISC HERNIATION;  Surgeon: Jessy Oto, MD;  Location: [place];  Service: Orthopedics;  Laterality: N/A;   Patient Active Problem List   Diagnosis Date Noted   Status post lumbar laminectomy 02/16/2022   Status post lumbar microdiscectomy 04/08/2021   Surgery, elective    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   Lumbar foraminal stenosis 02/19/2021   Trochanteric bursitis, left hip 02/19/2021    PCP: Wardell Honour, MD  REFERRING PROVIDER: Jessy Oto, MD  REFERRING DIAG: [phone] (ICD-10-CM) - S/P lumbar microdiscectomy M54.16 (ICD-10-CM) - Lumbar radiculopathy M79.2 (ICD-10-CM) - Neurogenic pain  Rationale for Evaluation and Treatment Rehabilitation  THERAPY DIAG:  Muscle weakness (generalized)  Abnormal posture  Other abnormalities of gait and mobility  Other low back pain  ONSET DATE:  02/16/22 lumbar microdiscectomy  SUBJECTIVE:                                                                                                                                                                                           SUBJECTIVE STATEMENT: Isaac Fletcher reports minimal pain.  He is most concerned with his Lt leg weakness and wants to focus on that today.  PERTINENT HISTORY:  Lumbar microdiscectomy 02/16/2022, 03/21/2021. Hx of LLE weakness and neurogenic pain Previous PT 09/2021 - 11/2021  PAIN:  NPRS scale: 0/10 Pain location: left low back/ Lt thigh Pain  description: pressure Aggravating factors: standing for extended periods of time, highest pain in mornings Relieving factors: sleeping on his back, movement  NPRS scale: 0/10 upon arrival Pain location: Lt knee Pain description: pressure Aggravating factors: standing for extended periods of time Relieving factors: sitting and laying down   PRECAUTIONS: Previous MD note - avoid frequent bending and stooping, no lifting greater than 10 lbs  WEIGHT BEARING RESTRICTIONS No  FALLS:  Has patient fallen in last 6 months? Yes. 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   04/22/2022 - none performed today due to time   POSTURE:  04/22/2022 Standing - anterior pelvic tilt and weight shift right  LUMBAR ROM:   Active  AROM  04/22/2022 AROM 05/08/2022  Flexion  To mid shin with some pain  Extension 75% WNL with no pain  Right lateral flexion To lateral knee, some pain in Lt low back To lateral knee, some pain in Lt low back  Left lateral flexion To lateral knee, some pain in Lt low back To lateral mid thigh, some pain in Lt low back  Right rotation    Left rotation     (Blank rows = not tested)  LOWER EXTREMITY ROM:     ROM (A: AROM, P: PROM)  Right 04/22/2022 Left 04/22/2022  Hip flexion    Hip extension    Hip abduction    Hip adduction    Hip internal rotation    Hip external rotation    Knee flexion    Knee extension    Ankle dorsiflexion    Ankle plantarflexion    Ankle inversion    Ankle eversion     (Blank rows = not tested)  LOWER EXTREMITY MMT:    MMT Right Eval 04/22/22 seated Left Eval 04/22/22 seated Left 05/08/22 Right 05/27/2022 Left 05/27/2022  Hip flexion 4/5 3+/5 4/5 5/5 5/5  Hip extension       Hip abduction       Hip adduction       Hip internal rotation       Hip external rotation       Knee flexion 4/5 4/5  5/5 5/5  Knee extension 4/5 3+/5 4/5 5/5 4+/5  Ankle dorsiflexion 4/5 5/5     Ankle plantarflexion       Ankle inversion       Ankle eversion        (Blank rows = not tested)  LEG LENGTH MEASUREMENTS: 05/21/22 RLE 35.75" LLE 36"  FUNCTIONAL TESTS:  04/22/2022 Sit to/from stand completed without UE support and displayed weight shift Rt throughout movement  GAIT: 04/22/2022 Distance walked: 100' Assistive device utilized: None Level of assistance: Complete Independence Comments: antalgic pattern with reduced stance time on  Lt   TODAY'S TREATMENT  06/10/2022: Therapeutic Exercises: Hip hike 4 sets of 5 for 3 seconds Prone alternating hip extensions 2 sets of 10 for 3  seconds  Functional Activities for sit to stand and stairs: Leg Press Double Leg 125# 15X slow eccentrics and slow eccentrics Leg Press Single Leg 75# 10X slow eccentrics Step down off 4 inch step (perfect posture) 2 sets of 10 Bil slow eccentrics Step-up and over off 4, 6 and 8 inch step 10X each slow eccentrics    06/05/2022: Therex: Recumbent bike lvl 4 8 mins, seat 8 Leg extension machine Double leg up, Lt leg lowering eccentric focus 10 lbs 2 x 10 Supine bridge 5 sec hold x 10, BTB for increased glute activation Leg press double leg (flat back on machine ) 112 lbs x 15, single leg x 15 62 lbs slow lowering focus QL stretch supine with rotation in opp directions for R side only.  Sidelying reverse clams with BTB x15 Bilat  Quad stretch to R LE Theragun to R quad due to tension Leg length discrepancy test due to reports of R LE being shorter per MD.     05/27/2022: Therex: Recumbent bike lvl 4 8 mins, seat 8 Leg extension machine Double leg up, Lt leg lowering eccentric focus 10 lbs 2 x 10 Prone opposite arm/leg lift small arc off table 3 sec hold x 10 bilateral Supine lumbar trunk rotation stretch 5 sec x 3 bilateral  Supine hip adductor stretch butterfly 15 sec x 3 bilateral Supine bridge 5 sec hold x 10 Leg press double leg (flat back on machine ) 112 lbs x 15, single leg x 15 62 lbs slow lowering focus Standing lumbar extension x 5 to tolerance    05/25/22:  Therex: UBE LE only 4.5 lvl 8 mins  HS curls: 35# 2x10  Knee extension 20# 2x10  Leg press 45* BLE 112# 2 x 15, single leg 62# 2 x 15 LLE, cues for controlled and slowed eccentric Supine bridges x 15 with 3 sec hold Sitting pelvic tilts x 10 with cueing for posture and abdominal contraction Heel taps 4" step 2x10 with fatigue reported Lunges onto BOSU ball 2x10 with L LE only.    05/21/2022: Therex: UBE LE only 4.5 lvl 8 mins  Leg press 45* BLE 112# 2 x 15, single leg 62# 2 x 15 LLE, cues for controlled and  slowed eccentric Supine bridges x 15 with 3 sec hold Bird dogs x 8 with pt reported cramp, numbness and tingling in medial Lt ankle region Sitting pelvic tilts x 10 with cueing for posture and abdominal contraction PT reviewed standing lumbar extension for use during work day and biking at home with moderate exertion, pt verbalized understanding  Self Care: PT advised drinking more water throughout day and consuming foods with more electrolytes, including eating breakfast prior to work and PT and pt verbalized understanding     PATIENT EDUCATION:  04/22/2022 Education details: [email or web address] Person educated: Patient Education method: Explanation, Demonstration, Tactile cues, Verbal cues, and Handouts Education comprehension: verbalized understanding, returned demonstration, verbal cues required, tactile cues required, and needs further education   HOME EXERCISE PROGRAM: Access Code: [email or web address] URL: [email or web address] Date: 06/10/2022 Prepared by: Vista Mink  Exercises - Seated Multifidi Isometric  - 1-2 x daily - 7 x weekly - 1 sets - 10 reps - 5-10 hold - Seated Straight Leg Heel Taps  - 1-2 x daily - 7 x weekly - 1-2 sets - 10-15 reps -  2-3 hold - Seated Abdominal Press into [employer]  - 1-2 x daily - 7 x weekly - 1 sets - 10 reps - 5-10 hold - Sit to Stand  - 1-2 x daily - 7 x weekly - 2-3 sets - 10-15 reps - Standing Lumbar Extension with Counter  - 3-5 x daily - 7 x weekly - 1 sets - 5-10 reps - Supine Hamstring Stretch with Strap (Mirrored)  - 2-3 x daily - 7 x weekly - 1 sets - 5 reps - 30 hold - Supine Dynamic Modified Thomas Quad and Hip Flexor Dynamic Stretch  - 2-3 x daily - 7 x weekly - 1 sets - 3 reps - 15 hold - Seated Hamstring Stretch (Mirrored)  - 1-2 x daily - 7 x weekly - 1 sets - 3-5 reps - 15 hold - Prone Hip Extension  - 1 x daily - 7 x weekly - 2-3 sets - 10 reps - 5 seconds hold - Lateral Step Down  - 1 x daily - 3 x weekly - 3 sets - 10  reps  ASSESSMENT:  CLINICAL IMPRESSION: Kalven did a good job with strength progressions today.  Left sided weakness is noted and we focused on activities to address some of his functional complaints (not trusting the left leg when fatigued, descending stairs, etc).  He has a comprehensive program and 3 activities were added (hip hike and push, prone hip extension and step-downs) to address some of his remaining weaknesses and functional impairments.  His prognosis is good to meet remaining long-term goals.  OBJECTIVE IMPAIRMENTS Abnormal gait, decreased activity tolerance, decreased mobility, decreased ROM, decreased strength, impaired sensation, and pain.   ACTIVITY LIMITATIONS carrying, lifting, bending, sitting, standing, squatting, stairs, and dressing  PARTICIPATION LIMITATIONS: community activity and occupation  PERSONAL FACTORS Time since onset of injury/illness/exacerbation and 1 comorbidity: see PMH  are also affecting patient's functional outcome.   REHAB POTENTIAL: Good  CLINICAL DECISION MAKING: Stable/uncomplicated  EVALUATION COMPLEXITY: Low   GOALS: Goals reviewed with patient? Yes  SHORT TERM GOALS: Target date: 05/13/2022  Patient will be independent with HEP to continue progressing outside of clinic. Baseline: see objective data Goal status: MET 05/14/2022   LONG TERM GOALS: Target date: 06/17/2022  Patient will score FOTO >/= 57% to reflect improved self-perceived functional ability Baseline: see objective data Goal status: on going - assessed 05/14/2022   2.  Patient will be independent with a HEP to maintain and progress functional gains from skilled PT. Baseline: see objective data Goal status: on going - assessed 06/10/2022   3.  Patient will report </= 2/10 pain with sitting, standing, and walking activities. Baseline: see objective data Goal status: Met 06/10/2022   4. Patient presents with lumbar extension WNL without increases in pain intensity or  peripheralization to allow for upright posture in sitting and standing, promoting normal functional movement ability. Baseline: see objective data Goal status: on going - assessed 06/05/2022  5.  Patient has 5/5 MMT bilateral hip and knee strength to represent appropriate strength needed for functional activities including community activity, occupation, and household requirements. Baseline: see objective data Goal status: on going - assessed 05/14/2022  6.  Patient verbalizes and demonstrates ability to perform duties for occupation and ADLs, including prolonged standing, lifting, and dressing. Baseline: see objective data Goal status: on going - assessed 05/14/2022   PLAN: PT FREQUENCY: 1-2x/week  PT DURATION: 8 weeks  PLANNED INTERVENTIONS: Therapeutic exercises, Therapeutic activity, Neuromuscular re-education, Balance training, Gait training,  Patient/Family education, Self Care, Joint mobilization, Stair training, Aquatic Therapy, Dry Needling, Electrical stimulation, Cryotherapy, Moist heat, Ultrasound, Ionotophoresis 50m/ml Dexamethasone, Manual therapy, Re-evaluation, and physical performance tests .  PLAN FOR NEXT SESSION: Progress low back and Lt leg strength as appropriate.  RFarley LyPT, MPT 06/10/22  6:10 PM

## 2022-06-11 NOTE — Therapy (Deleted)
OUTPATIENT PHYSICAL THERAPY TREATMENT   Patient Name: Isaac Fletcher MRN: [id number] DOB:08-17-1981, 41 y.o., male Today's Date: 06/11/2022  End of Session    Past Medical History:  Diagnosis Date   Back pain    Constipation    H/O hernia repair    Past Surgical History:  Procedure Laterality Date   HERNIA REPAIR     Herniated Disc     LUMBAR LAMINECTOMY N/A 03/21/2021   Procedure: LEFT LEFT FOUR-FIVE MICRODISCECTOMY;  Surgeon: Marybelle Killings, MD;  Location: [place];  Service: Orthopedics;  Laterality: N/A;   LUMBAR LAMINECTOMY/DECOMPRESSION MICRODISCECTOMY N/A 02/16/2022   Procedure: LEFT FAR LATERAL APPROACH TO REMOVE LEFT [name] DISC HERNIATION;  Surgeon: Jessy Oto, MD;  Location: [place];  Service: Orthopedics;  Laterality: N/A;   Patient Active Problem List   Diagnosis Date Noted   Status post lumbar laminectomy 02/16/2022   Status post lumbar microdiscectomy 04/08/2021   Surgery, elective    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   Lumbar foraminal stenosis 02/19/2021   Trochanteric bursitis, left hip 02/19/2021    PCP: Wardell Honour, MD  REFERRING PROVIDER: Jessy Oto, MD  REFERRING DIAG: [phone] (ICD-10-CM) - S/P lumbar microdiscectomy M54.16 (ICD-10-CM) - Lumbar radiculopathy M79.2 (ICD-10-CM) - Neurogenic pain  Rationale for Evaluation and Treatment Rehabilitation  THERAPY DIAG:  No diagnosis found.  ONSET DATE: 02/16/22 lumbar microdiscectomy  SUBJECTIVE:                                                                                                                                                                                           SUBJECTIVE STATEMENT: Ramzy reports minimal pain.  He is most concerned with his Lt leg weakness and wants to focus on that today.  PERTINENT HISTORY:  Lumbar microdiscectomy 02/16/2022, 03/21/2021. Hx of LLE weakness and neurogenic pain Previous PT 09/2021 - 11/2021  PAIN:  NPRS scale: 0/10 Pain  location: left low back/ Lt thigh Pain description: pressure Aggravating factors: standing for extended periods of time, highest pain in mornings Relieving factors: sleeping on his back, movement  NPRS scale: 0/10 upon arrival Pain location: Lt knee Pain description: pressure Aggravating factors: standing for extended periods of time Relieving factors: sitting and laying down   PRECAUTIONS: Previous MD note - avoid frequent bending and stooping, no lifting greater than 10 lbs  WEIGHT BEARING RESTRICTIONS No  FALLS:  Has patient fallen in last 6 months? Yes. 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   04/22/2022 - none performed today due to time   POSTURE:  04/22/2022 Standing - anterior pelvic tilt and weight shift right  LUMBAR ROM:   Active  AROM  04/22/2022 AROM 05/08/2022  Flexion  To mid shin with some pain  Extension 75% WNL with no pain  Right lateral flexion To lateral knee, some pain in Lt low back To lateral knee, some pain in Lt low back  Left lateral flexion To lateral knee, some pain in Lt low back To lateral mid thigh, some pain in Lt low back  Right rotation    Left rotation      (Blank rows = not tested)  LOWER EXTREMITY ROM:     ROM (A: AROM, P: PROM)  Right 04/22/2022 Left 04/22/2022  Hip flexion    Hip extension    Hip abduction    Hip adduction    Hip internal rotation    Hip external rotation    Knee flexion    Knee extension    Ankle dorsiflexion    Ankle plantarflexion    Ankle inversion    Ankle eversion     (Blank rows = not tested)  LOWER EXTREMITY MMT:    MMT Right Eval 04/22/22 seated Left Eval 04/22/22 seated Left 05/08/22 Right 05/27/2022 Left 05/27/2022  Hip flexion 4/5 3+/5 4/5 5/5 5/5  Hip extension       Hip abduction       Hip adduction       Hip internal rotation       Hip external rotation       Knee flexion 4/5 4/5  5/5 5/5  Knee extension 4/5 3+/5 4/5 5/5 4+/5  Ankle dorsiflexion 4/5 5/5     Ankle plantarflexion       Ankle inversion       Ankle eversion        (Blank rows = not tested)  LEG LENGTH MEASUREMENTS: 05/21/22 RLE 35.75" LLE 36"  FUNCTIONAL TESTS:  04/22/2022 Sit to/from stand completed without UE support and displayed weight shift Rt throughout movement  GAIT: 04/22/2022 Distance walked: 100' Assistive device utilized: None Level of assistance: Complete Independence Comments: antalgic pattern with reduced stance time on Lt   TODAY'S TREATMENT  06/10/2022: Therapeutic Exercises: Hip hike 4 sets of 5 for 3 seconds Prone alternating hip extensions 2 sets of 10 for 3 seconds  Functional Activities for sit to stand and stairs: Leg Press Double Leg 125# 15X slow eccentrics and slow eccentrics Leg Press Single Leg 75# 10X slow eccentrics Step down off 4 inch step (perfect posture) 2 sets of 10 Bil slow eccentrics Step-up and over off 4, 6 and 8 inch step 10X each slow eccentrics    06/05/2022: Therex: Recumbent bike lvl 4 8 mins, seat 8 Leg extension machine Double leg up, Lt leg lowering eccentric focus 10 lbs 2 x 10  Supine bridge 5 sec hold x 10, BTB for increased glute activation Leg press  double leg (flat back on machine ) 112 lbs x 15, single leg x 15 62 lbs slow lowering focus QL stretch supine with rotation in opp directions for R side only.  Sidelying reverse clams with BTB x15 Bilat  Quad stretch to R LE Theragun to R quad due to tension Leg length discrepancy test due to reports of R LE being shorter per MD.     05/27/2022: Therex: Recumbent bike lvl 4 8 mins, seat 8 Leg extension machine Double leg up, Lt leg lowering eccentric focus 10 lbs 2 x 10 Prone opposite arm/leg lift small arc off table 3 sec hold x 10 bilateral Supine lumbar trunk rotation stretch 5 sec x 3 bilateral  Supine hip adductor stretch butterfly 15 sec x 3 bilateral Supine bridge 5 sec hold x 10 Leg press double leg (flat back on machine ) 112 lbs x 15, single leg x 15 62 lbs slow lowering focus Standing lumbar extension x 5 to tolerance    05/25/22:  Therex: UBE LE only 4.5 lvl 8 mins  HS curls: 35# 2x10  Knee extension 20# 2x10  Leg press 45* BLE 112# 2 x 15, single leg 62# 2 x 15 LLE, cues for controlled and slowed eccentric Supine bridges x 15 with 3 sec hold Sitting pelvic tilts x 10 with cueing for posture and abdominal contraction Heel taps 4" step 2x10 with fatigue reported Lunges onto BOSU ball 2x10 with L LE only.    05/21/2022: Therex: UBE LE only 4.5 lvl 8 mins  Leg press 45* BLE 112# 2 x 15, single leg 62# 2 x 15 LLE, cues for controlled and slowed eccentric Supine bridges x 15 with 3 sec hold Bird dogs x 8 with pt reported cramp, numbness and tingling in medial Lt ankle region Sitting pelvic tilts x 10 with cueing for posture and abdominal contraction PT reviewed standing lumbar extension for use during work day and biking at home with moderate exertion, pt verbalized understanding  Self Care: PT advised drinking more water throughout day and consuming foods with more electrolytes, including eating breakfast prior to work and PT and pt verbalized understanding      PATIENT EDUCATION:  04/22/2022 Education details: [email or web address] Person educated: Patient Education method: Explanation, Demonstration, Tactile cues, Verbal cues, and Handouts Education comprehension: verbalized understanding, returned demonstration, verbal cues required, tactile cues required, and needs further education   HOME EXERCISE PROGRAM: Access Code: [email or web address] URL: [email or web address] Date: 06/10/2022 Prepared by: Vista Mink  Exercises - Seated Multifidi Isometric  - 1-2 x daily - 7 x weekly - 1 sets - 10 reps - 5-10 hold - Seated Straight Leg Heel Taps  - 1-2 x daily - 7 x weekly - 1-2 sets - 10-15 reps - 2-3 hold - Seated Abdominal Press into [employer]  - 1-2 x daily - 7 x weekly - 1 sets - 10 reps - 5-10 hold - Sit to Stand  - 1-2 x daily - 7 x weekly - 2-3 sets - 10-15 reps - Standing Lumbar Extension with Counter  - 3-5 x daily - 7 x weekly - 1 sets - 5-10 reps - Supine Hamstring Stretch with Strap (Mirrored)  - 2-3 x daily - 7 x weekly - 1 sets - 5 reps - 30 hold - Supine Dynamic Modified Thomas Quad and Hip Flexor Dynamic Stretch  - 2-3 x daily - 7 x weekly - 1  sets - 3 reps - 15 hold - Seated Hamstring Stretch (Mirrored)  - 1-2 x daily - 7 x weekly - 1 sets - 3-5 reps - 15 hold - Prone Hip Extension  - 1 x daily - 7 x weekly - 2-3 sets - 10 reps - 5 seconds hold - Lateral Step Down  - 1 x daily - 3 x weekly - 3 sets - 10 reps  ASSESSMENT:  CLINICAL IMPRESSION: Wilfredo did a good job with strength progressions today.  Left sided weakness is noted and we focused on activities to address some of his functional complaints (not trusting the left leg when fatigued, descending stairs, etc).  He has a comprehensive program and 3 activities were added (hip hike and push, prone hip extension and step-downs) to address some of his remaining weaknesses and functional impairments.  His prognosis is good to meet remaining long-term goals.  OBJECTIVE IMPAIRMENTS  Abnormal gait, decreased activity tolerance, decreased mobility, decreased ROM, decreased strength, impaired sensation, and pain.   ACTIVITY LIMITATIONS carrying, lifting, bending, sitting, standing, squatting, stairs, and dressing  PARTICIPATION LIMITATIONS: community activity and occupation  PERSONAL FACTORS Time since onset of injury/illness/exacerbation and 1 comorbidity: see PMH  are also affecting patient's functional outcome.   REHAB POTENTIAL: Good  CLINICAL DECISION MAKING: Stable/uncomplicated  EVALUATION COMPLEXITY: Low   GOALS: Goals reviewed with patient? Yes  SHORT TERM GOALS: Target date: 05/13/2022  Patient will be independent with HEP to continue progressing outside of clinic. Baseline: see objective data Goal status: MET 05/14/2022   LONG TERM GOALS: Target date: 06/17/2022  Patient will score FOTO >/= 57% to reflect improved self-perceived functional ability Baseline: see objective data Goal status: on going - assessed 05/14/2022   2.  Patient will be independent with a HEP to maintain and progress functional gains from skilled PT. Baseline: see objective data Goal status: on going - assessed 06/10/2022   3.  Patient will report </= 2/10 pain with sitting, standing, and walking activities. Baseline: see objective data Goal status: Met 06/10/2022   4. Patient presents with lumbar extension WNL without increases in pain intensity or peripheralization to allow for upright posture in sitting and standing, promoting normal functional movement ability. Baseline: see objective data Goal status: on going - assessed 06/05/2022  5.  Patient has 5/5 MMT bilateral hip and knee strength to represent appropriate strength needed for functional activities including community activity, occupation, and household requirements. Baseline: see objective data Goal status: on going - assessed 05/14/2022  6.  Patient verbalizes and demonstrates ability to perform duties for occupation  and ADLs, including prolonged standing, lifting, and dressing. Baseline: see objective data Goal status: on going - assessed 05/14/2022   PLAN: PT FREQUENCY: 1-2x/week  PT DURATION: 8 weeks  PLANNED INTERVENTIONS: Therapeutic exercises, Therapeutic activity, Neuromuscular re-education, Balance training, Gait training, Patient/Family education, Self Care, Joint mobilization, Stair training, Aquatic Therapy, Dry Needling, Electrical stimulation, Cryotherapy, Moist heat, Ultrasound, Ionotophoresis 103m/ml Dexamethasone, Manual therapy, Re-evaluation, and physical performance tests .  PLAN FOR NEXT SESSION: Progress low back and Lt leg strength as appropriate.  SRudi HeapPT, DPT 06/11/22  11:00 AM

## 2022-06-15 ENCOUNTER — Encounter: Payer: BC Managed Care – PPO | Admitting: Physical Therapy

## 2022-06-15 ENCOUNTER — Telehealth: Payer: Self-pay | Admitting: Physical Therapy

## 2022-06-15 NOTE — Telephone Encounter (Signed)
Interpreter spoke with patient. Pt states that he tried to cancel the appt online. Interpreter believes that there was a misunderstanding due to a language barrier with cancelling his appt.

## 2022-06-17 ENCOUNTER — Ambulatory Visit (INDEPENDENT_AMBULATORY_CARE_PROVIDER_SITE_OTHER): Payer: BC Managed Care – PPO | Admitting: Rehabilitative and Restorative Service Providers"

## 2022-06-17 ENCOUNTER — Encounter: Payer: Self-pay | Admitting: Rehabilitative and Restorative Service Providers"

## 2022-06-17 DIAGNOSIS — R2689 Other abnormalities of gait and mobility: Secondary | ICD-10-CM

## 2022-06-17 DIAGNOSIS — R293 Abnormal posture: Secondary | ICD-10-CM | POA: Diagnosis not present

## 2022-06-17 DIAGNOSIS — M6281 Muscle weakness (generalized): Secondary | ICD-10-CM

## 2022-06-17 DIAGNOSIS — M5459 Other low back pain: Secondary | ICD-10-CM | POA: Diagnosis not present

## 2022-06-17 NOTE — Therapy (Addendum)
OUTPATIENT PHYSICAL THERAPY TREATMENT /PROGRESS NOTE /DISCHARGE   Patient Name: Isaac Fletcher MRN: [id number] DOB:[birth year], 41 y.o., male Today's Date: 06/17/2022  Progress Note Reporting Period 04/22/2022 to 06/17/2022  See note below for Objective Data and Assessment of Progress/Goals.    End of Session  PT End of Session - 06/17/22 1021     Visit Number 11    Number of Visits 18    Date for PT Re-Evaluation 06/17/22    Authorization Type BCBS    Authorization Time Period $20 COPAY, 30 PT VISITS    PT Start Time 1013    PT Stop Time 1053    PT Time Calculation (min) 40 min    Activity Tolerance Patient tolerated treatment well    Behavior During Therapy WFL for tasks assessed/performed              Past Medical History:  Diagnosis Date   Back pain    Constipation    H/O hernia repair    Past Surgical History:  Procedure Laterality Date   HERNIA REPAIR     Herniated Disc     LUMBAR LAMINECTOMY N/A 03/21/2021   Procedure: LEFT LEFT FOUR-FIVE MICRODISCECTOMY;  Surgeon: Marybelle Killings, MD;  Location: [facility];  Service: Orthopedics;  Laterality: N/A;   LUMBAR LAMINECTOMY/DECOMPRESSION MICRODISCECTOMY N/A 02/16/2022   Procedure: LEFT FAR LATERAL APPROACH TO REMOVE LEFT [name] DISC HERNIATION;  Surgeon: Jessy Oto, MD;  Location: [facility];  Service: Orthopedics;  Laterality: N/A;   Patient Active Problem List   Diagnosis Date Noted   Status post lumbar laminectomy 02/16/2022   Status post lumbar microdiscectomy 04/08/2021   Surgery, elective    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   Lumbar foraminal stenosis 02/19/2021   Trochanteric bursitis, left hip 02/19/2021    PCP: Wardell Honour, MD  REFERRING PROVIDER: Jessy Oto, MD  REFERRING DIAG: [id number] (ICD-10-CM) - S/P lumbar microdiscectomy M54.16 (ICD-10-CM) - Lumbar radiculopathy M79.2 (ICD-10-CM) - Neurogenic pain  Rationale for Evaluation and Treatment Rehabilitation  THERAPY  DIAG:  Other low back pain  Muscle weakness (generalized)  Other abnormalities of gait and mobility  Abnormal posture  ONSET DATE: 02/16/22 lumbar microdiscectomy  SUBJECTIVE:                                                                                                                                                                                           SUBJECTIVE STATEMENT: Pt indicated feeling less complaints of pain overall in last week or so.  Pt indicated some tightness in back.   Pt indicated overall improvement to normal at 50% at this  time.   Pt reported pain less than before.  Pt indicated improved tolerance to work but still can feel some difficulty c prolonged activity at work.   PERTINENT HISTORY:  Lumbar microdiscectomy 02/16/2022, 03/21/2021. Hx of LLE weakness and neurogenic pain Previous PT 09/2021 - 11/2021  PAIN:  NPRS scale: 0/10 upon arrival Pain location: left low back/ Lt thigh Pain description: tightness not pain Aggravating factors: standing for extended periods of time, highest pain in mornings Relieving factors: sleeping on his back, movement  NPRS scale: 0/10 upon arrival Pain location: Lt knee Pain description: pressure Aggravating factors: standing for extended periods of time Relieving factors: sitting and laying down   PRECAUTIONS: Previous MD note - avoid frequent bending and stooping, no lifting greater than 10 lbs  WEIGHT BEARING RESTRICTIONS No  FALLS:  Has patient fallen in last 6 months? Yes. 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   04/22/2022 - none performed today due to time   POSTURE:  04/22/2022 Standing - anterior pelvic tilt and weight shift right  LUMBAR ROM:   Active  AROM  04/22/2022 AROM 05/08/2022 AROM 06/17/2022  Flexion  To mid shin with some pain To mid shin no complaints.   Extension 75% WNL with no pain 100 % WFL s symptoms  Right lateral flexion To lateral knee, some pain in Lt low back To lateral knee, some pain in Lt low back   Left lateral flexion To lateral knee, some pain in Lt low back To lateral mid thigh, some pain in Lt low back   Right rotation     Left rotation      (Blank rows = not tested)  LOWER EXTREMITY ROM:     ROM (A: AROM, P: PROM)  Right 04/22/2022 Left 04/22/2022  Hip flexion    Hip extension    Hip abduction    Hip adduction    Hip internal rotation    Hip external rotation    Knee flexion    Knee extension    Ankle dorsiflexion    Ankle plantarflexion    Ankle inversion    Ankle eversion     (Blank rows = not tested)  LOWER EXTREMITY MMT:    MMT Right Eval 04/22/22 seated Left Eval 04/22/22 seated Left 05/08/22 Right 05/27/2022 Left 05/27/2022 Left 06/17/2022  Hip flexion 4/5 3+/5 4/5 5/5 5/5 5/5  Hip extension        Hip abduction        Hip adduction        Hip internal rotation        Hip external rotation        Knee flexion 4/5 4/5  5/5 5/5 5/5  Knee extension 4/5 3+/5 4/5 5/5 4+/5 5/5  Ankle dorsiflexion 4/5 5/5      Ankle plantarflexion        Ankle inversion        Ankle eversion         (Blank rows = not  tested)  LEG LENGTH MEASUREMENTS: 05/21/22 RLE 35.75" LLE 36"  FUNCTIONAL TESTS:  04/22/2022 Sit to/from stand completed without UE support and displayed weight shift Rt throughout movement  GAIT: 04/22/2022 Distance walked: 100' Assistive device utilized: None Level of assistance: Complete Independence Comments: antalgic pattern with reduced stance time on Lt   TODAY'S TREATMENT  06/17/2022 Therex: Nustep Lvl 6 10 mins UE/LE , RPM 60-70 Lateral step down 6 inch x 15 bilateral with single hand touch down during exercise Standing lumbar extension x 5 Sit to stand to sit transfer 18 inch chair x 10 no UE assist Seated SLR x 15 bilateral (cues for slow control focus) Seated multifidi ue lift into table 5 sec hold x 10 Seated table press down UE 5 sec hold x 10  Leg press bilateral 125 lbs x20, single leg 75 lbs x 15 bilateral  Review of existing HEP c cues for techniques and consistent emphasis. Additional time spent to ensure best understanding.     06/10/2022: Therapeutic Exercises: Hip hike 4 sets of 5 for 3 seconds Prone alternating hip extensions 2 sets of 10 for 3 seconds  Functional Activities for sit to stand and stairs: Leg Press Double Leg 125# 15X slow eccentrics and slow eccentrics Leg Press Single Leg 75# 10X slow eccentrics Step down off 4 inch step (perfect posture) 2 sets of 10 Bil slow eccentrics Step-up and over off 4, 6 and 8 inch step 10X each slow eccentrics    06/05/2022: Therex: Recumbent bike lvl 4 8 mins, seat 8 Leg extension machine Double leg up, Lt leg lowering eccentric focus 10 lbs 2 x 10 Supine bridge 5 sec hold x 10, BTB for increased glute activation Leg press double leg (flat back on machine ) 112 lbs x 15, single leg x 15 62 lbs slow lowering focus QL stretch supine with rotation in opp directions for R side only.  Sidelying reverse clams with BTB x15 Bilat  Quad stretch to R LE Theragun to R quad due to tension Leg length  discrepancy test due to reports of R LE being shorter per MD.      PATIENT EDUCATION:  06/28/2022 Education details: HEP updates/education Person educated: Patient Education method: Consulting civil engineer, Media planner, Corporate treasurer cues, Verbal cues, and Handouts Education comprehension: verbalized understanding, returned demonstration, verbal cues required, tactile cues required, and needs further education   HOME EXERCISE PROGRAM: Access Code: [email or web address] URL: [email or web address] Date: 06/17/2022 Prepared by: Scot Jun  Exercises - Supine Lower Trunk Rotation  - 2-3 x daily - 7 x weekly - 1 sets - 3-5 reps - 15 hold - Standing Lumbar Extension with Counter  - 3-5 x daily - 7 x weekly - 1 sets - 5-10 reps - Seated Multifidi Isometric  - 1-2 x daily - 7 x weekly - 1 sets - 10 reps - 5-10 hold - Seated Abdominal Press into [employer]  - 1-2 x daily - 7 x weekly - 1 sets - 10 reps - 5-10 hold - Seated Straight Leg Heel Taps  - 1-2 x daily - 7 x weekly - 1-2 sets - 10-15 reps - 2-3 hold - Sit to Stand  - 1-2 x daily - 7 x  weekly - 2-3 sets - 10-15 reps - Seated Hamstring Stretch (Mirrored)  - 1-2 x daily - 7 x weekly - 1 sets - 3-5 reps - 15 hold - Prone Hip Extension  - 1 x daily - 7 x weekly - 2-3 sets - 10 reps - 5 seconds hold - Lateral Step Down  - 1 x daily - 3 x weekly - 3 sets - 10 reps  ASSESSMENT:  CLINICAL IMPRESSION: Pt has attended 11 visits overall during course of treatment, reporting reduced pain complaints and improving tolerance to daily activity including work.  See objective data for updated information.  Gains noted in lumbar mobility and LE strength to this point.  Extended time spent today to help improve HEP knowledge and consistency for home use.  Pt returns to MD office after today's visit.  Pt may be appropriate for transitioning towards HEP at this time.. Adjustment to POC as necessary (recert required if returning).   OBJECTIVE IMPAIRMENTS Abnormal  gait, decreased activity tolerance, decreased mobility, decreased ROM, decreased strength, impaired sensation, and pain.   ACTIVITY LIMITATIONS carrying, lifting, bending, sitting, standing, squatting, stairs, and dressing  PARTICIPATION LIMITATIONS: community activity and occupation  PERSONAL FACTORS Time since onset of injury/illness/exacerbation and 1 comorbidity: see PMH  are also affecting patient's functional outcome.   REHAB POTENTIAL: Good  CLINICAL DECISION MAKING: Stable/uncomplicated  EVALUATION COMPLEXITY: Low   GOALS: Goals reviewed with patient? Yes  SHORT TERM GOALS: Target date: 05/13/2022  Patient will be independent with HEP to continue progressing outside of clinic. Baseline: see objective data Goal status: MET 05/14/2022   LONG TERM GOALS: Target date: 06/17/2022  Patient will score FOTO >/= 57% to reflect improved self-perceived functional ability Baseline: see objective data Goal status: on going - assessed 05/14/2022   2.  Patient will be independent with a HEP to maintain and progress functional gains from skilled PT. Baseline: see objective data Goal status: Mostly met - 06/17/2022   3.  Patient will report </= 2/10 pain with sitting, standing, and walking activities. Baseline: see objective data Goal status: Met 06/10/2022   4. Patient presents with lumbar extension WNL without increases in pain intensity or peripheralization to allow for upright posture in sitting and standing, promoting normal functional movement ability. Baseline: see objective data Goal status: met - 06/17/2022  5.  Patient has 5/5 MMT bilateral hip and knee strength to represent appropriate strength needed for functional activities including community activity, occupation, and household requirements. Baseline: see objective data Goal status: met - 06/17/2022  6.  Patient verbalizes and demonstrates ability to perform duties for occupation and ADLs, including prolonged standing,  lifting, and dressing. Baseline: see objective data Goal status: met - 06/17/2022   PLAN: PT FREQUENCY: 1-2x/week  PT DURATION: 8 weeks  PLANNED INTERVENTIONS: Therapeutic exercises, Therapeutic activity, Neuromuscular re-education, Balance training, Gait training, Patient/Family education, Self Care, Joint mobilization, Stair training, Aquatic Therapy, Dry Needling, Electrical stimulation, Cryotherapy, Moist heat, Ultrasound, Ionotophoresis 22m/ml Dexamethasone, Manual therapy, Re-evaluation, and physical performance tests .  PLAN FOR NEXT SESSION: Return to MD.  Trial HEP recommended at this time.  Recert required if returning for continued treatment in future.  D/C after inactivity 30 days.   MScot Jun PT, DPT, OCS, ATC 06/17/22  10:58 AM   PHYSICAL THERAPY DISCHARGE SUMMARY  Visits from Start of Care: 11  Current functional level related to goals / functional outcomes: See note   Remaining deficits: See note   Education / Equipment: HEP  Patient  goals were partially met. Patient is being discharged due to not returning since the last visit.  Scot Jun, PT, DPT, OCS, ATC 08/12/22  9:08 AM

## 2022-06-19 ENCOUNTER — Encounter: Payer: Self-pay | Admitting: Specialist

## 2022-06-19 ENCOUNTER — Ambulatory Visit (INDEPENDENT_AMBULATORY_CARE_PROVIDER_SITE_OTHER): Payer: BC Managed Care – PPO | Admitting: Specialist

## 2022-06-19 VITALS — BP 127/90 | HR 49 | Ht 72.0 in | Wt 187.0 lb

## 2022-06-19 DIAGNOSIS — M2170 Unequal limb length (acquired), unspecified site: Secondary | ICD-10-CM | POA: Diagnosis not present

## 2022-06-19 DIAGNOSIS — Z9889 Other specified postprocedural states: Secondary | ICD-10-CM | POA: Diagnosis not present

## 2022-06-19 DIAGNOSIS — M222X2 Patellofemoral disorders, left knee: Secondary | ICD-10-CM

## 2022-06-19 DIAGNOSIS — M5416 Radiculopathy, lumbar region: Secondary | ICD-10-CM | POA: Diagnosis not present

## 2022-06-19 MED ORDER — B COMPLEX VITAMINS PO CAPS: 1.0000 | ORAL_CAPSULE | Freq: Every day | ORAL | 3 refills | Status: AC

## 2022-06-19 MED ORDER — DICLOFENAC SODIUM 75 MG PO TBEC: 75.0000 mg | DELAYED_RELEASE_TABLET | Freq: Two times a day (BID) | ORAL | 6 refills | Status: DC

## 2022-06-19 MED ORDER — GABAPENTIN 300 MG PO CAPS
ORAL_CAPSULE | ORAL | 6 refills | Status: DC
Start: 2022-06-19 — End: 2022-07-17

## 2022-06-19 NOTE — Progress Notes (Signed)
Office Visit Note   Patient: Isaac Fletcher           Date of Birth: [birth year]           MRN: [id number] Visit Date: 06/19/2022              Requested by: No referring provider defined for this encounter. PCP: Pcp, No   Assessment & Plan: Visit Diagnoses:  1. Status post lumbar microdiscectomy   2. Lumbar radiculopathy   3. Radiculopathy, lumbar region   4. Leg length discrepancy   5. S/P lumbar microdiscectomy     Plan: Avoid frequent bending and stooping  No lifting greater than 10 lbs. May use ice or moist heat for pain. Weight loss is of benefit. Best medication for lumbar disc disease is arthritis medications like motrin,diclofenac, celebrex and naprosyn. Exercise is important to improve your indurance and does allow people to function better inspite of back pain. Continue use of voltaren( diclofenac) for disc pain and muscular discomfort, Gabapentin 300mg  for nerve pain and symptoms, vitamin B complex daily for building blocks for nerve regeneration.   Follow-Up Instructions: Return in about 3 months (around 09/19/2022) for Return with Dr. 09/21/2022 in 3 months.   Orders:  No orders of the defined types were placed in this encounter.  No orders of the defined types were placed in this encounter.     Procedures: No procedures performed   Clinical Data: No additional findings.   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 He has completed PT and is still experiencing some hypesthesia of the left anterior thigh. Some weakness. No bowel or bladder difficulty.     Review of Systems  Constitutional: Negative.   HENT: Negative.    Eyes: Negative.   Respiratory: Negative.    Cardiovascular: Negative.   Gastrointestinal: Negative.   Endocrine: Negative.   Genitourinary: Negative.   Musculoskeletal: Negative.   Skin: Negative.    Allergic/Immunologic: Negative.   Neurological: Negative.   Hematological: Negative.   Psychiatric/Behavioral: Negative.       Objective: Vital Signs: BP (!) 127/90   Pulse (!) 49   Ht 6' (1.829 m)   Wt 187 lb (84.8 kg)   BMI 25.36 kg/m   Physical Exam Constitutional:      Appearance: He is well-developed.  HENT:     Head: Normocephalic and atraumatic.  Eyes:     Pupils: Pupils are equal, round, and reactive to light.  Pulmonary:     Effort: Pulmonary effort is normal.     Breath sounds: Normal breath sounds.  Abdominal:     General: Bowel sounds are normal.     Palpations: Abdomen is soft.  Musculoskeletal:     Cervical back: Normal range of motion and neck supple.  Skin:    General: Skin is warm and dry.  Neurological:     Mental Status: He is alert and oriented to person, place, and time.  Psychiatric:        Behavior: Behavior normal.        Thought Content: Thought content normal.        Judgment: Judgment normal.     Back Exam   Tenderness  The patient is experiencing tenderness in the lumbar.  Range of Motion  Extension:  abnormal  Flexion:  abnormal  Lateral bend right:  abnormal  Lateral bend left:  abnormal  Rotation right:  abnormal  Rotation left:  abnormal   Muscle Strength  Right Quadriceps:  5/5  Left Quadriceps:  5/5  Right Hamstrings:  5/5  Left Hamstrings:  5/5   Reflexes  Patellar:  0/4 Achilles:  2/4  Comments:  Left knee reflex is 0, right is 2+      Specialty Comments:  MRI LUMBAR SPINE WITHOUT AND WITH CONTRAST     TECHNIQUE:  Multiplanar and multiecho pulse sequences of the lumbar spine were  obtained without and with intravenous contrast.     CONTRAST:  4mL MULTIHANCE GADOBENATE DIMEGLUMINE 529 MG/ML IV SOLN     COMPARISON:  MRI of the lumbar spine November 14, 2020.     FINDINGS:  Segmentation: Standard segmentation is seen. The inferior-most fully  formed intervertebral disc is labeled L5-S1. Same numbering  as on  the prior MRI.     Alignment: Straightening of the normal lumbar lordosis. No  substantial sagittal subluxation.     Vertebrae: Congenitally short pedicles. Vertebral body heights are  maintained. No focal marrow edema to suggest acute fracture or  discitis/osteomyelitis.     Conus medullaris and cauda equina: Conus extends to the L2 level.  Conus and cauda equina appear normal. No abnormal enhancement of the  conus or cauda equina.     Paraspinal and other soft tissues: Postoperative changes in the  lower left lumbar spine. Otherwise, unremarkable.     Disc levels:     T12-L1: No significant disc protrusion, foraminal stenosis, or canal  stenosis.     L1-L2: No significant disc protrusion, foraminal stenosis, or canal  stenosis.     L2-L3: No significant disc protrusion, foraminal stenosis, or canal  stenosis.     L3-L4: No significant disc protrusion, foraminal stenosis, or canal  stenosis.     L4-L5: Disc height loss and desiccation. Broad disc bulge. Enhancing  scar tissue in the posterolateral left canal and left foramen,  compatible with sequela interval micro discectomy. This scar tissue  limits assessment; however, left foraminal stenosis is likely  improved without evidence of recurrent disc herniation. No  significant canal stenosis. Similar moderate right foraminal  stenosis.     L5-S1: Mild disc bulging with moderate right and mild left foraminal  stenosis. No significant canal stenosis.     IMPRESSION:  1. Interval left L4-L5 micro discectomy. Scar tissue in the left  foramen and lateral canal limits assessment, but suspect improved  foraminal stenosis without evidence of recurrent herniation. Similar  moderate right foraminal stenosis at this level.  2. At L5-S1, similar moderate right and mild left foraminal  stenosis.        Electronically Signed    By: Feliberto Harts M.D.    On: 07/07/2021 12:07  Imaging: No results found.   PMFS  History: Patient Active Problem List   Diagnosis Date Noted   Status post lumbar laminectomy 02/16/2022   Status post lumbar microdiscectomy 04/08/2021   Surgery, elective    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   Lumbar foraminal stenosis 02/19/2021   Trochanteric bursitis, left hip 02/19/2021   Past Medical History:  Diagnosis Date   Back pain    Constipation    H/O hernia repair     Family History  Problem Relation Age of Onset   Diabetes Father     Past Surgical History:  Procedure Laterality Date   HERNIA REPAIR     Herniated Disc  LUMBAR LAMINECTOMY N/A 03/21/2021   Procedure: LEFT LEFT FOUR-FIVE MICRODISCECTOMY;  Surgeon: Marybelle Killings, MD;  Location: [facility];  Service: Orthopedics;  Laterality: N/A;   LUMBAR LAMINECTOMY/DECOMPRESSION MICRODISCECTOMY N/A 02/16/2022   Procedure: LEFT FAR LATERAL APPROACH TO REMOVE LEFT [name] DISC HERNIATION;  Surgeon: Jessy Oto, MD;  Location: [facility];  Service: Orthopedics;  Laterality: N/A;   Social History   Occupational History   Not on file  Tobacco Use   Smoking status: Never   Smokeless tobacco: Never  Vaping Use   Vaping Use: Never used  Substance and Sexual Activity   Alcohol use: Yes   Drug use: Never   Sexual activity: Not on file

## 2022-06-19 NOTE — Patient Instructions (Addendum)
Avoid frequent bending and stooping  No lifting greater than 10 lbs. May use ice or moist heat for pain. Weight loss is of benefit. Best medication for lumbar disc disease is arthritis medications like motrin,diclofenac, celebrex and naprosyn. Exercise is important to improve your indurance and does allow people to function better inspite of back pain. Continue use of voltaren( diclofenac) for disc pain and muscular discomfort, Gabapentin 300mg  for nerve pain and symptoms, vitamin B complex daily for building blocks for nerve regeneration.  Zotrix creame is an over the counter medication that is sometimes

## 2022-06-22 ENCOUNTER — Encounter: Payer: BC Managed Care – PPO | Admitting: Rehabilitative and Restorative Service Providers"

## 2022-06-24 ENCOUNTER — Encounter: Payer: BC Managed Care – PPO | Admitting: Rehabilitative and Restorative Service Providers"

## 2022-06-29 ENCOUNTER — Encounter: Payer: BC Managed Care – PPO | Admitting: Rehabilitative and Restorative Service Providers"

## 2022-07-01 ENCOUNTER — Encounter: Payer: BC Managed Care – PPO | Admitting: Rehabilitative and Restorative Service Providers"

## 2022-07-16 ENCOUNTER — Other Ambulatory Visit: Payer: Self-pay | Admitting: Orthopaedic Surgery

## 2022-09-18 ENCOUNTER — Ambulatory Visit (INDEPENDENT_AMBULATORY_CARE_PROVIDER_SITE_OTHER): Payer: BC Managed Care – PPO | Admitting: Orthopedic Surgery

## 2022-09-18 ENCOUNTER — Ambulatory Visit (INDEPENDENT_AMBULATORY_CARE_PROVIDER_SITE_OTHER): Payer: BC Managed Care – PPO

## 2022-09-18 DIAGNOSIS — Z9889 Other specified postprocedural states: Secondary | ICD-10-CM | POA: Diagnosis not present

## 2022-09-18 MED ORDER — METHOCARBAMOL 500 MG PO TABS: 500.0000 mg | ORAL_TABLET | Freq: Four times a day (QID) | ORAL | 2 refills | Status: DC | PRN

## 2022-09-18 NOTE — Progress Notes (Signed)
Orthopedic Spine Surgery Office Note  Assessment: Patient is a 42 y.o. male status post [name] microdiscectomy and more recently [name] far lateral discectomy (~6 months post-op). Has had some improvement, but with some lingering radicular symptoms   Plan: -Explained that initially conservative treatment is tried as a significant number of patients may experience relief with these treatment modalities. Discussed that the conservative treatments include:  -activity modification  -physical therapy  -over the counter pain medications  -medrol dosepak  -lumbar steroid injections -Patient has tried PT, over-the-counter medications, activity modification, home exercises -Recommended [name] transforaminal injection on the left side.  Referral was provided to Dr. Ernestina Patches. -Prescribed him Robaxin to help with some of his muscle spasm and tightness type pain -Patient should return to office in 6 months, x-rays at next visit: AP/lateral/flex/ex lumbar   Patient expressed understanding of the plan and all questions were answered to the patient's satisfaction.   ___________________________________________________________________________   History:  Patient is a 42 y.o. male who presents today for lumbar spine.  Patient has a surgical history of a [name] microdiscectomy and then a [name] far lateral microdiscectomy with my partner Dr. Louanne Skye.  His most recent surgery was the far lateral discectomy on 02/16/2022.  He states that he is gotten some relief of his leg pain down the left leg after that surgery.  He still has some tightness in the thigh and some electric type pains in the anterior leg.  He also says that he has back tightness and pain.  He has been using muscle relaxer which seems to help.  He is working with physical therapy.  Besides the electric type pain in his left anterior leg, no other numbness or paresthesias.  No right-sided symptoms.  Has not noticed any redness or drainage from his  incisions.  Treatments tried: PT, over-the-counter pain medications  Review of systems: Denies fevers and chills, night sweats, unexplained weight loss, history of cancer, pain that wakes them at night  Past medical history: Constipation  Allergies: NKDA  Past surgical history:  Hernia repair L4/5 microdiscectomy L4/5 far lateral discectomy  Social history: Denies use of nicotine product (smoking, vaping, patches, smokeless) Alcohol use: social Denies recreational drug use  Physical Exam:  General: no acute distress, appears stated age Neurologic: alert, answering questions appropriately, following commands Respiratory: unlabored breathing on room air, symmetric chest rise Psychiatric: appropriate affect, normal cadence to speech  Posterior lumbar spine incisions appear well-healed with no evidence of infection  MSK (spine):  -Strength exam      Left  Right EHL    5/5  5/5 TA    5/5  5/5 GSC    5/5  5/5 Knee extension  5/5  5/5 Hip flexion   5/5  5/5  -Sensory exam    Sensation intact to light touch in L3-S1 nerve distributions of bilateral lower extremities  -Straight leg raise: negative -Contralateral straight leg raise: negative -Femoral nerve stretch test: negative -Clonus: no beats bilaterally  -Left hip exam: no pain through range of motion, negative stinchfield -Right hip exam: no pain through range of motion, negative stinchfield  Imaging: XR of the lumbar spine from 09/18/2022 was independently reviewed and interpreted, showing no fracture or dislocation.  Disc heights maintained.  No significant degenerative changes.  No evidence of instability on flexion/extension views.   Patient name: Isaac Fletcher Patient MRN: [id number] Date of visit: 09/18/22

## 2022-10-07 ENCOUNTER — Ambulatory Visit: Payer: BC Managed Care – PPO | Admitting: Physical Medicine and Rehabilitation

## 2022-10-07 ENCOUNTER — Ambulatory Visit: Payer: Self-pay

## 2022-10-07 VITALS — BP 100/68 | HR 49

## 2022-10-07 DIAGNOSIS — M5416 Radiculopathy, lumbar region: Secondary | ICD-10-CM

## 2022-10-07 DIAGNOSIS — M961 Postlaminectomy syndrome, not elsewhere classified: Secondary | ICD-10-CM

## 2022-10-07 MED ORDER — METHYLPREDNISOLONE ACETATE 80 MG/ML IJ SUSP
80.0000 mg | Freq: Once | INTRAMUSCULAR | Status: AC
  Administered 2022-10-07: 80 mg

## 2022-10-07 NOTE — Procedures (Signed)
Lumbosacral Transforaminal Epidural Steroid Injection - Sub-Pedicular Approach with Fluoroscopic Guidance  Patient: Isaac Fletcher      Date of Birth: 02-Apr-1981 MRN: [id number] PCP: Pcp, No      Visit Date: 10/07/2022   Universal Protocol:    Date/Time: 10/07/2022  Consent Given By: the patient  Position: PRONE  Additional Comments: Vital signs were monitored before and after the procedure. Patient was prepped and draped in the usual sterile fashion. The correct patient, procedure, and site was verified.   Injection Procedure Details:   Procedure diagnoses: Lumbar radiculopathy [M54.16]    Meds Administered:  Meds ordered this encounter  Medications   methylPREDNISolone acetate (DEPO-MEDROL) injection 80 mg    Laterality: Left  Location/Site: L4  Needle:5.0 in., 22 ga.  Short bevel or Quincke spinal needle  Needle Placement: Transforaminal  Findings:    -Comments: Excellent flow of contrast along the nerve, nerve root and into the epidural space.  Patient had vasovagal episode post injection.  He was placed in Trendelenburg and on his side and then after a while had good return of blood pressure.  He was discharged without any issue.  Procedure Details: After squaring off the end-plates to get a true AP view, the C-arm was positioned so that an oblique view of the foramen as noted above was visualized. The target area is just inferior to the "nose of the scotty dog" or sub pedicular. The soft tissues overlying this structure were infiltrated with 2-3 ml. of 1% Lidocaine without Epinephrine.  The spinal needle was inserted toward the target using a "trajectory" view along the fluoroscope beam.  Under AP and lateral visualization, the needle was advanced so it did not puncture dura and was located close the 6 O'Clock position of the pedical in AP tracterory. Biplanar projections were used to confirm position. Aspiration was confirmed to be negative for CSF and/or blood. A  1-2 ml. volume of Isovue-250 was injected and flow of contrast was noted at each level. Radiographs were obtained for documentation purposes.   After attaining the desired flow of contrast documented above, a 0.5 to 1.0 ml test dose of 0.25% Marcaine was injected into each respective transforaminal space.  The patient was observed for 90 seconds post injection.  After no sensory deficits were reported, and normal lower extremity motor function was noted,   the above injectate was administered so that equal amounts of the injectate were placed at each foramen (level) into the transforaminal epidural space.   Additional Comments:   Dressing: 2 x 2 sterile gauze and Band-Aid    Post-procedure details: Patient was observed during the procedure. Post-procedure instructions were reviewed.  Patient left the clinic in stable condition.

## 2022-10-07 NOTE — Patient Instructions (Signed)

## 2022-10-07 NOTE — Progress Notes (Signed)
Functional Pain Scale - descriptive words and definitions  Unmanageable (7)  Pain interferes with normal ADL's/nothing seems to help/sleep is very difficult/active distractions are very difficult to concentrate on. Severe range order  Average Pain 7   +Driver, -BT, -Dye Allergies.   Left sided lower back pain that radiates down the leg to the shin

## 2022-10-07 NOTE — Progress Notes (Signed)
Isaac Fletcher - 42 y.o. male MRN [id number]  Date of birth: 1980/09/13  Office Visit Note: Visit Date: 10/07/2022 PCP: Pcp, No Referred by: Callie Fielding, MD  Subjective: Chief Complaint  Patient presents with   Lower Back - Pain   HPI:  Isaac Fletcher is a 42 y.o. male who comes in today at the request of Dr. Ileene Rubens for planned Left [name] Lumbar Transforaminal epidural steroid injection with fluoroscopic guidance.  The patient has failed conservative care including home exercise, medications, time and activity modification.  This injection will be diagnostic and hopefully therapeutic.  Please see requesting physician notes for further details and justification.  Interpreter present.   ROS Otherwise per HPI.  Assessment & Plan: Visit Diagnoses:    ICD-10-CM   1. Lumbar radiculopathy  M54.16 XR C-ARM NO REPORT    Epidural Steroid injection    methylPREDNISolone acetate (DEPO-MEDROL) injection 80 mg    2. Post laminectomy syndrome  M96.1       Plan: No additional findings.   Meds & Orders:  Meds ordered this encounter  Medications   methylPREDNISolone acetate (DEPO-MEDROL) injection 80 mg    Orders Placed This Encounter  Procedures   XR C-ARM NO REPORT   Epidural Steroid injection    Follow-up: Return for visit to requesting provider as needed.   Procedures: No procedures performed  Lumbosacral Transforaminal Epidural Steroid Injection - Sub-Pedicular Approach with Fluoroscopic Guidance  Patient: Isaac Fletcher      Date of Birth: July 01, 1981 MRN: [id number] PCP: Pcp, No      Visit Date: 10/07/2022   Universal Protocol:    Date/Time: 10/07/2022  Consent Given By: the patient  Position: PRONE  Additional Comments: Vital signs were monitored before and after the procedure. Patient was prepped and draped in the usual sterile fashion. The correct patient, procedure, and site was verified.   Injection Procedure Details:   Procedure diagnoses:  Lumbar radiculopathy [M54.16]    Meds Administered:  Meds ordered this encounter  Medications   methylPREDNISolone acetate (DEPO-MEDROL) injection 80 mg    Laterality: Left  Location/Site: L4  Needle:5.0 in., 22 ga.  Short bevel or Quincke spinal needle  Needle Placement: Transforaminal  Findings:    -Comments: Excellent flow of contrast along the nerve, nerve root and into the epidural space.  Patient had vasovagal episode post injection.  He was placed in Trendelenburg and on his side and then after a while had good return of blood pressure.  He was discharged without any issue.  Procedure Details: After squaring off the end-plates to get a true AP view, the C-arm was positioned so that an oblique view of the foramen as noted above was visualized. The target area is just inferior to the "nose of the scotty dog" or sub pedicular. The soft tissues overlying this structure were infiltrated with 2-3 ml. of 1% Lidocaine without Epinephrine.  The spinal needle was inserted toward the target using a "trajectory" view along the fluoroscope beam.  Under AP and lateral visualization, the needle was advanced so it did not puncture dura and was located close the 6 O'Clock position of the pedical in AP tracterory. Biplanar projections were used to confirm position. Aspiration was confirmed to be negative for CSF and/or blood. A 1-2 ml. volume of Isovue-250 was injected and flow of contrast was noted at each level. Radiographs were obtained for documentation purposes.   After attaining the desired flow of contrast documented above, a 0.5 to  1.0 ml test dose of 0.25% Marcaine was injected into each respective transforaminal space.  The patient was observed for 90 seconds post injection.  After no sensory deficits were reported, and normal lower extremity motor function was noted,   the above injectate was administered so that equal amounts of the injectate were placed at each foramen (level) into the  transforaminal epidural space.   Additional Comments:   Dressing: 2 x 2 sterile gauze and Band-Aid    Post-procedure details: Patient was observed during the procedure. Post-procedure instructions were reviewed.  Patient left the clinic in stable condition.    Clinical History: MRI LUMBAR SPINE WITHOUT AND WITH CONTRAST     TECHNIQUE:  Multiplanar and multiecho pulse sequences of the lumbar spine were  obtained without and with intravenous contrast.     CONTRAST:  73mL MULTIHANCE GADOBENATE DIMEGLUMINE 529 MG/ML IV SOLN     COMPARISON:  MRI of the lumbar spine November 14, 2020.     FINDINGS:  Segmentation: Standard segmentation is seen. The inferior-most fully  formed intervertebral disc is labeled L5-S1. Same numbering as on  the prior MRI.     Alignment: Straightening of the normal lumbar lordosis. No  substantial sagittal subluxation.     Vertebrae: Congenitally short pedicles. Vertebral body heights are  maintained. No focal marrow edema to suggest acute fracture or  discitis/osteomyelitis.     Conus medullaris and cauda equina: Conus extends to the L2 level.  Conus and cauda equina appear normal. No abnormal enhancement of the  conus or cauda equina.     Paraspinal and other soft tissues: Postoperative changes in the  lower left lumbar spine. Otherwise, unremarkable.     Disc levels:     T12-L1: No significant disc protrusion, foraminal stenosis, or canal  stenosis.     L1-L2: No significant disc protrusion, foraminal stenosis, or canal  stenosis.     L2-L3: No significant disc protrusion, foraminal stenosis, or canal  stenosis.     L3-L4: No significant disc protrusion, foraminal stenosis, or canal  stenosis.     L4-L5: Disc height loss and desiccation. Broad disc bulge. Enhancing  scar tissue in the posterolateral left canal and left foramen,  compatible with sequela interval micro discectomy. This scar tissue  limits assessment; however, left  foraminal stenosis is likely  improved without evidence of recurrent disc herniation. No  significant canal stenosis. Similar moderate right foraminal  stenosis.     L5-S1: Mild disc bulging with moderate right and mild left foraminal  stenosis. No significant canal stenosis.     IMPRESSION:  1. Interval left L4-L5 micro discectomy. Scar tissue in the left  foramen and lateral canal limits assessment, but suspect improved  foraminal stenosis without evidence of recurrent herniation. Similar  moderate right foraminal stenosis at this level.  2. At L5-S1, similar moderate right and mild left foraminal  stenosis.        Electronically Signed    By: Margaretha Sheffield M.D.    On: 07/07/2021 12:07     Objective:  VS:  HT:    WT:   BMI:     BP:100/68  HR:(!) 49bpm  TEMP: ( )  RESP:  Physical Exam Vitals and nursing note reviewed.  Constitutional:      General: He is not in acute distress.    Appearance: Normal appearance. He is not ill-appearing.  HENT:     Head: Normocephalic and atraumatic.     Right Ear: External ear normal.  Left Ear: External ear normal.     Nose: No congestion.  Eyes:     Extraocular Movements: Extraocular movements intact.  Cardiovascular:     Rate and Rhythm: Normal rate.     Pulses: Normal pulses.  Pulmonary:     Effort: Pulmonary effort is normal. No respiratory distress.  Abdominal:     General: There is no distension.     Palpations: Abdomen is soft.  Musculoskeletal:        General: No tenderness or signs of injury.     Cervical back: Neck supple.     Right lower leg: No edema.     Left lower leg: No edema.     Comments: Patient has good distal strength without clonus.  Skin:    Findings: No erythema or rash.  Neurological:     General: No focal deficit present.     Mental Status: He is alert and oriented to person, place, and time.     Sensory: No sensory deficit.     Motor: No weakness or abnormal muscle tone.      Coordination: Coordination normal.  Psychiatric:        Mood and Affect: Mood normal.        Behavior: Behavior normal.      Imaging: XR C-ARM NO REPORT  Result Date: 10/07/2022 Please see Notes tab for imaging impression.

## 2022-12-02 ENCOUNTER — Ambulatory Visit: Payer: BC Managed Care – PPO | Admitting: Orthopedic Surgery

## 2022-12-02 DIAGNOSIS — M5441 Lumbago with sciatica, right side: Secondary | ICD-10-CM | POA: Diagnosis not present

## 2022-12-02 DIAGNOSIS — G8929 Other chronic pain: Secondary | ICD-10-CM | POA: Diagnosis not present

## 2022-12-02 NOTE — Progress Notes (Signed)
Orthopedic Spine Surgery Office Note   Assessment: Patient is a 42 y.o. male status post L4/5 microdiscectomy and more recently L4/5 far lateral discectomy (~9 months post-op). Has had some improvement, but with some lingering radicular symptoms     Plan: -Patient has tried PT, over-the-counter medications, activity modification, home exercises, 2 surgeries, injection -Recommended pain management at this point since he has had no relief with 2 prior surgeries or with an injection.  Referral provided to him today -Patient should return to office on an as-needed basis     Patient expressed understanding of the plan and all questions were answered to the patient's satisfaction.   ___________________________________________________________________________  History: Patient is a 42 y.o. male who has been previously seen in the office for left-sided low back pain and periodic left anterior thigh pain.  Patient has undergone prior L4/5 microdiscectomy and then later underwent a L4/5 far lateral discectomy.  He states he got very temporary relief with the surgeries and then his symptoms returned.  His symptoms are the same as his last time I saw him.  At last visit, I recommended an injection for diagnostic and therapeutic purposes.  He got no relief, not even for a day, with that injection.  His pain today he points more towards the left lower back in the area of the prior incisions.  He also states he sometimes gets pain to his left anterior thigh and the left lateral thigh over the greater trochanter.  No pain going down the right lower extremity.  No pain past the hip on the left lower extremity.  No saddle anesthesia.  No bowel or bladder incontinence.  Previous treatments: PT, over-the-counter medications, activity modification, home exercises, 2 surgeries, injection  Physical Exam:  General: no acute distress, appears stated age Neurologic: alert, answering questions appropriately, following  commands Respiratory: unlabored breathing on room air, symmetric chest rise Psychiatric: appropriate affect, normal cadence to speech   MSK (spine):  -Strength exam      Left  Right EHL    5/5  5/5 TA    5/5  5/5 GSC    5/5  5/5 Knee extension  5/5  5/5 Hip flexion   5/5  5/5  -Sensory exam    Sensation intact to light touch in L3-S1 nerve distributions of bilateral lower extremities  -Straight leg raise: Negative bilaterally -Femoral nerve stretch test: Negative bilaterally -Clonus: no beats bilaterally  -Tender to palpation over the left lumbar paraspinal muscles especially in the area of the [name] incision.  No tenderness palpation over the greater trochanter of the left hip.  Imaging: XR of the lumbar spine from 09/18/2022 was previously independently reviewed and interpreted, showing no fracture or dislocation.  Disc heights maintained.  No significant degenerative changes.  No evidence of instability on flexion/extension views.    Patient name: Isaac Fletcher Patient MRN: [id number] Date of visit: 12/02/22

## 2023-04-16 ENCOUNTER — Encounter: Payer: Self-pay | Admitting: Family Medicine

## 2023-04-19 ENCOUNTER — Encounter: Payer: Self-pay | Admitting: Family Medicine

## 2023-06-23 ENCOUNTER — Other Ambulatory Visit (INDEPENDENT_AMBULATORY_CARE_PROVIDER_SITE_OTHER): Payer: BC Managed Care – PPO

## 2023-06-23 ENCOUNTER — Ambulatory Visit: Payer: BC Managed Care – PPO | Admitting: Orthopedic Surgery

## 2023-06-23 DIAGNOSIS — M25552 Pain in left hip: Secondary | ICD-10-CM | POA: Diagnosis not present

## 2023-06-23 NOTE — Progress Notes (Signed)
Orthopedic Spine Surgery Office Note   Assessment: Patient is a 42 y.o. male status post L4/5 microdiscectomy and more recently L4/5 far lateral discectomy (~9 months post-op).  Now presenting with left posterior hip pain, exam points to SI joint as possible etiology     Plan: -Patient has tried PT, over-the-counter medications, activity modification, home exercises, 2 surgeries, injections -Recommended diagnostic/therapeutic left SI joint injection with Dr. Shon Baton -Patient should return to office on an as-needed basis     Patient expressed understanding of the plan and all questions were answered to the patient's satisfaction.    ___________________________________________________________________________   History: Patient is a 42 y.o. male who has undergone prior L4/5 microdiscectomy and then later underwent a L4/5 far lateral discectomy with my retired partner, Dr. Otelia Sergeant, comes in today for left posterior hip pain.  He said he had several months of left buttock and posterior hip pain.  There is no trauma or injury that preceded the onset of the pain.  He feels the pain is worse with activity and improves with rest.  He does not have any pain in his right lower extremity.  He still has some radiating pain down the left lower extremity which I had seen previously about but he feels those symptoms are unchanged.  He does not have any saddle anesthesia.  No bowel or bladder incontinence.   Previous treatments: PT, over-the-counter medications, activity modification, home exercises, 2 surgeries, injections    Physical Exam:   General: no acute distress, appears stated age Neurologic: alert, answering questions appropriately, following commands Respiratory: unlabored breathing on room air, symmetric chest rise Psychiatric: appropriate affect, normal cadence to speech     MSK (spine):   -Strength exam                                                   Left                  Right EHL                               5/5                  5/5 TA                                 5/5                  5/5 GSC                             5/5                  5/5 Knee extension            5/5                  5/5 Hip flexion                    5/5                  5/5   -Sensory exam  Sensation intact to light touch in L3-S1 nerve distributions of bilateral lower extremities   -Straight leg raise: Negative bilaterally -Femoral nerve stretch test: Negative bilaterally -Clonus: no beats bilaterally  -Left hip exam: No pain through range of motion, negative Stinchfield, positive FABER, negative SI joint compression test, positive Gaenslen's, TTP over the SI joint     Imaging: XR of the left hip from 06/23/2023 was independently reviewed and interpreted, showing no fracture or dislocation.  No significant degenerative changes within the hip joint.     Patient name: Isaac Fletcher Patient MRN: [id number] Date of visit: 06/23/23

## 2023-07-01 ENCOUNTER — Other Ambulatory Visit: Payer: Self-pay

## 2023-07-01 ENCOUNTER — Ambulatory Visit: Payer: BC Managed Care – PPO | Admitting: Sports Medicine

## 2023-07-01 ENCOUNTER — Encounter: Payer: Self-pay | Admitting: Sports Medicine

## 2023-07-01 DIAGNOSIS — G8929 Other chronic pain: Secondary | ICD-10-CM

## 2023-07-01 DIAGNOSIS — M7918 Myalgia, other site: Secondary | ICD-10-CM

## 2023-07-01 DIAGNOSIS — Z9889 Other specified postprocedural states: Secondary | ICD-10-CM | POA: Diagnosis not present

## 2023-07-01 DIAGNOSIS — M533 Sacrococcygeal disorders, not elsewhere classified: Secondary | ICD-10-CM

## 2023-07-01 MED ORDER — CELECOXIB 100 MG PO CAPS: 100.0000 mg | ORAL_CAPSULE | Freq: Two times a day (BID) | ORAL | 1 refills | Status: DC

## 2023-07-01 NOTE — Progress Notes (Signed)
Office & Procedure Note  Patient: Isaac Fletcher             Date of Birth: 1981/03/22           MRN: [id number]             Visit Date: 07/01/2023  HPI: [name] is a pleasant 42 year-old male who presents for left low back and posterior hip/leg pain.  The use of an in person [place] interpreter was used throughout the entirety of the visit today.  Had seen my partner, Dr. Christell Constant for this without possible SI joint would be the etiology.  Sent here for further evaluation and possible SI joint injection. [name] does tell me today that he feels a cramping sensation into the anterior lateral thigh for the leg at times as well.  Patient has been taking tramadol as needed for pain control.  He is asking for some medication for pain relief in the interim.  PE:  - Lumbar/SI: There is a well-healed incision of the mid to lower lumbar spine from previous back surgery.  Positive TTP just distal to the left PSIS.  Positive SI joint compression and Gaenslen's test. + Fortin's point test.  Visit Diagnoses:  1. Chronic left SI joint pain   2. Pain in left buttock   3. S/P lumbar microdiscectomy    Procedures:  U/S-guided SI-joint injection, Left   After discussion of risk/benefits/indications, informed verbal consent was obtained. A timeout was then performed. The patient was positioned in a prone position on exam room table with a pillow placed under the pelvis for mild hip flexion. The SI joint area was cleaned and prepped with betadine and alcohol swabs. Sterile ultrasound gel was applied and the ultrasound transducer was placed in an anatomic axial plane over the PSIS, then moved distally over the SI-joint. Using ultrasound guidance, a 22-gauge, 3.5" needle was inserted from a medial to lateral approach utilizing an in-plane approach and directed into the SI-joint. The SI-joint was then injected with a mixture of 4:2 lidocaine:depomedrol with visualization of the injectate flow into the SI-joint under  ultrasound visualization. The patient tolerated the procedure well without immediate complications.  Plan:   -Through shared decision making, we elected to proceed with diagnostic and hopefully therapeutic ultrasound-guided left SI joint injection, patient tolerated well -Discussed with him that I am unable to refill his tramadol medication, but we will start him on Celebrex 100 mg twice daily with food - follow-up with Dr. Christell Constant as indicated --> discussed seeing how he is doing at the 1 month mark and if pain is still continuing pain, would recommend follow-up with Dr. Philemon Kingdom, DO Primary Care Sports Medicine Physician  [facility] Orthoped[facility]  This note was dictated using Dragon naturally speaking software and may contain errors in syntax, spelling, or content which have not been identified prior to signing this note.

## 2023-08-01 ENCOUNTER — Other Ambulatory Visit: Payer: Self-pay | Admitting: Sports Medicine

## 2023-11-08 ENCOUNTER — Ambulatory Visit: Payer: BC Managed Care – PPO | Admitting: Orthopedic Surgery

## 2023-11-29 ENCOUNTER — Other Ambulatory Visit (INDEPENDENT_AMBULATORY_CARE_PROVIDER_SITE_OTHER): Payer: Self-pay

## 2023-11-29 ENCOUNTER — Ambulatory Visit (INDEPENDENT_AMBULATORY_CARE_PROVIDER_SITE_OTHER): Admitting: Orthopedic Surgery

## 2023-11-29 DIAGNOSIS — Z9889 Other specified postprocedural states: Secondary | ICD-10-CM

## 2023-11-29 DIAGNOSIS — M545 Low back pain, unspecified: Secondary | ICD-10-CM

## 2023-11-29 NOTE — Progress Notes (Signed)
 Orthopedic Spine Surgery Office Note   Assessment: Patient is a 43 y.o. male status post L4/5 microdiscectomy and more recently L4/5 far lateral discectomy (~9 months post-op).  Reporting bilateral low back pain that is worse on the left     Plan: -Patient has tried PT, over-the-counter medications, activity modification, home exercises, 2 surgeries, injections -Did not respond to the SI joint injection so would not repeat -Patient has tried multiple treatments without relief to his satisfaction, so discussed pain management as an option.  He was interested in trying that.  Referral provided to him today -Patient should return to office on an as-needed basis     Patient expressed understanding of the plan and all questions were answered to the patient's satisfaction.    ___________________________________________________________________________   History: Patient is a 43 y.o. male who has undergone prior L4/5 microdiscectomy and then later underwent a L4/5 far lateral discectomy with my retired partner, Dr. Otelia Sergeant who comes in today with bilateral lower back pain.  He feels it is worse on the left side.  He has no pain radiating to either lower extremity.  He feels a tightness in his low back.  After last visit, he got an injection with Dr. Shon Baton to the [facility] joint.  He said he got no relief with that injection.  He has not developed any new symptoms since he was last seen in the office.   Previous treatments: PT, over-the-counter medications, activity modification, home exercises, 2 surgeries, injections     Physical Exam:   General: no acute distress, appears stated age Neurologic: alert, answering questions appropriately, following commands Respiratory: unlabored breathing on room air, symmetric chest rise Psychiatric: appropriate affect, normal cadence to speech     MSK (spine):   -Strength exam                                                   Left                  Right EHL                               5/5                  5/5 TA                                 5/5                  5/5 GSC                             5/5                  5/5 Knee extension            5/5                  5/5 Hip flexion                    5/5                  5/5   -Sensory exam  Sensation intact to light touch in L3-S1 nerve distributions of bilateral lower extremities   -Straight leg raise: Negative bilaterally -Femoral nerve stretch test: Negative bilaterally -Clonus: no beats bilaterally   -Left hip exam: No pain through range of motion, negative Stinchfield, negative FABER, negative SI joint compression test -Right hip exam: No pain to range of motion, negative Stinchfield, negative FABER, negative SI joint compression test     Imaging: XRs of the lumbar spine from 11/25/2023 were independently reviewed and interpreted, showing no significant degenerative changes.  Lordotic alignment.  No evidence of instability on flexion/extension views.  No fracture or dislocation seen.     Patient name: Isaac Fletcher Patient MRN: [id number] Date of visit: 11/29/23

## 2023-12-06 ENCOUNTER — Encounter: Payer: Self-pay | Admitting: Physical Medicine & Rehabilitation

## 2024-01-17 ENCOUNTER — Encounter: Attending: Physical Medicine & Rehabilitation | Admitting: Physical Medicine & Rehabilitation

## 2024-03-23 ENCOUNTER — Ambulatory Visit: Admitting: Orthopedic Surgery

## 2024-03-23 ENCOUNTER — Other Ambulatory Visit (HOSPITAL_COMMUNITY): Payer: Self-pay

## 2024-03-23 DIAGNOSIS — M545 Low back pain, unspecified: Secondary | ICD-10-CM

## 2024-03-23 DIAGNOSIS — M25562 Pain in left knee: Secondary | ICD-10-CM

## 2024-03-23 MED ORDER — CYCLOBENZAPRINE HCL 10 MG PO TABS
10.0000 mg | ORAL_TABLET | Freq: Three times a day (TID) | ORAL | 0 refills | Status: AC | PRN
  Filled 2024-03-23: qty 30, 10d supply, fill #0

## 2024-03-23 NOTE — Progress Notes (Signed)
 Orthopedic Spine Surgery Office Note   Assessment: Patient is a 43 y.o. male with chronic low back pain mostly on the left side.  He is also presenting with left knee pain today as well     Plan: -Patient has tried PT, over-the-counter medications, activity modification, home exercises, 2 surgeries, injections - Patient has not responded to any treatments including surgery, so discussed pain management.  He has seen [facility] but has not pleased with how they are.  Referral provided to him today for, pain management -For his knee pain, discussed treatment options today -For his back spasms, I prescribed Flexeril  -Told him that if his knee pain persists after 6 weeks of physical therapy without further with a MRI -Patient should return to office on an as-needed basis     Patient expressed understanding of the plan and all questions were answered to the patient's satisfaction.    ___________________________________________________________________________   History: Patient is a 43 y.o. male who has undergone prior L4/5 microdiscectomy and then later underwent a L4/5 far lateral discectomy with my retired partner, Dr. Lucilla who presents with low back pain.  He said it is felt on a daily basis.  He feels it mostly on the left side of his lower back.  He does not have any pain rating into the lower extremity.  He says he also gets cramping in his lower back.  He said it feels like a tightening or spasm.  He has previously used muscle relaxers and found it was helpful for that particular pain.  He has not found any of the nonoperative or even operative treatments to be helpful for his chronic low back pain.  He also comes in to talk about his left knee.  He said that it has been bothering him for the last couple of months.  He feels it mostly with weightbearing activities and improves with rest.  He feels that over the medial and lateral aspects of the knee.  There is no trauma or injury that  preceded the onset of the knee pain.  He has not seen any swelling around the knee.   Previous treatments: PT, over-the-counter medications, activity modification, home exercises, 2 surgeries, injections     Physical Exam:   General: no acute distress, appears stated age Neurologic: alert, answering questions appropriately, following commands Respiratory: unlabored breathing on room air, symmetric chest rise Psychiatric: appropriate affect, normal cadence to speech     MSK (spine):   -Strength exam                                                   Left                  Right EHL                              5/5                  5/5 TA                                 5/5                  5/5 GSC  5/5                  5/5 Knee extension            5/5                  5/5 Hip flexion                    5/5                  5/5   -Sensory exam                           Sensation intact to light touch in L3-S1 nerve distributions of bilateral lower extremities   -Left knee exam: Negative Lachman, negative posterior drawer, knee stable to varus valgus stress, no palpable effusion, negative McMurray, negative Thessaly     Imaging: XRs of the lumbar spine from 11/25/2023 were previously independently reviewed and interpreted, showing no significant degenerative changes.  Lordotic alignment.  No evidence of instability on flexion/extension views.  No fracture or dislocation seen.     Patient name: Isaac Fletcher Patient MRN: [id number] Date of visit: 03/23/24

## 2024-03-30 ENCOUNTER — Encounter: Payer: Self-pay | Admitting: Physical Medicine & Rehabilitation

## 2024-04-04 ENCOUNTER — Other Ambulatory Visit (HOSPITAL_COMMUNITY): Payer: Self-pay

## 2024-04-04 NOTE — Therapy (Signed)
 OUTPATIENT PHYSICAL THERAPY LOWER EXTREMITY EVALUATION   Patient Name: Isaac Fletcher MRN: [id number] DOB:December 11, 1980, 43 y.o., male Today's Date: 04/05/2024  END OF SESSION:  PT End of Session - 04/05/24 0749     Visit Number 1    Number of Visits 16    Date for PT Re-Evaluation 05/31/24    Authorization Type [employer] $20 COPAY, 21V, AUTH NEEDED    PT Start Time 0759    PT Stop Time 0847    PT Time Calculation (min) 48 min    Activity Tolerance Patient tolerated treatment well    Behavior During Therapy WFL for tasks assessed/performed          Past Medical History:  Diagnosis Date   Back pain    Constipation    H/O hernia repair    Past Surgical History:  Procedure Laterality Date   HERNIA REPAIR     Herniated Disc     LUMBAR LAMINECTOMY N/A 03/21/2021   Procedure: LEFT LEFT FOUR-FIVE MICRODISCECTOMY;  Surgeon: Barbarann Oneil BROCKS, MD;  Location: [facility] OR;  Service: Orthopedics;  Laterality: N/A;   LUMBAR LAMINECTOMY/DECOMPRESSION MICRODISCECTOMY N/A 02/16/2022   Procedure: LEFT FAR LATERAL APPROACH TO REMOVE LEFT [name] DISC HERNIATION;  Surgeon: Lucilla Lynwood FORBES, MD;  Location: [facility] OR;  Service: Orthopedics;  Laterality: N/A;   Patient Active Problem List   Diagnosis Date Noted   Status post lumbar laminectomy 02/16/2022   Status post lumbar microdiscectomy 04/08/2021   Surgery, elective    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   Lumbar foraminal stenosis 02/19/2021   Trochanteric bursitis, left hip 02/19/2021    PCP: N/A  REFERRING PROVIDER: Ozell DELENA Ada, MD  REFERRING DIAG: [phone] (ICD-10-CM) - Acute pain of left knee  THERAPY DIAG:  Acute pain of left knee  Stiffness of left knee, not elsewhere classified  Muscle weakness (generalized)  Other abnormalities of gait and mobility  Other low back pain  Rationale for Evaluation and Treatment: Rehabilitation  ONSET DATE: since prior to back surgeries (2022, 2023)   SUBJECTIVE:   SUBJECTIVE  STATEMENT: Patient states that he is not having any pain today, though rates at consistently being at 7/10 on pain scale.   PERTINENT HISTORY: Patient has undergone 2 surgeries on lower back, to include a lumbar laminectomy in 2022 and a lumbar decompression/laminectomy with microdiscectomy in 2023. Patient is currently having low back pain, Lt glute pain, and tingling in the shin/lower leg that intermittently feels like a cramping pain which makes him Lt LE feel heavy. Patient also believes that his legs are different lengths and that going up and down the stairs outside of the home make him feel like he's going to fall. Patient notes seeing a chiropractor.  PAIN:  Are you having pain? Yes: NPRS scale: 7/10 consistently   Pain location: low back, L leg, L glute  Pain description: stiffness, cramping, pinching nerves   Aggravating factors: standing for prolonged periods of time Relieving factors: lying down, sitting down, resting, tramadol , gabapentin    PRECAUTIONS: None  RED FLAGS: None   WEIGHT BEARING RESTRICTIONS: No  FALLS:  Has patient fallen in last 6 months? 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  DIAGNOSTIC FINDINGS: XRs of the lumbar spine from 11/25/2023 were independently reviewed and  interpreted, showing no significant degenerative changes.  Lordotic  alignment.  No evidence of instability on flexion/extension views.  No  fracture or dislocation seen.   PATIENT SURVEYS:  PSFS: THE PATIENT SPECIFIC FUNCTIONAL SCALE  Place score of 0-10 (0 = unable to perform activity and 10 = able to perform activity at the same level as  before injury or problem)  Activity Date: 04/05/2024    Walking for prolonged periods  5    2.  Stairs  5    3. Lift heavy  5    4.      Total Score 5      Total Score = Sum of activity scores/number of activities  Minimally Detectable Change: 3 points (for single activity); 2 points (for average score)  [facility] (nd). The Patient Specific Functional Scale . Retrieved from [email or web address]   COGNITION: Overall cognitive status: Within functional limits for tasks assessed     SENSATION: Not tested  EDEMA:    MUSCLE LENGTH: Not tested on eval   POSTURE: No Significant postural limitations  PALPATION: Not TTP with palpation to lumbar musculature, lumbar spine, sacrum, PSIS, and bilat glutes   LOWER EXTREMITY ROM:  ROM Right Eval 04/05/2024 Left Eval 04/05/2024  Hip flexion 100/112; painful  98/108  Hip extension    Hip abduction    Hip adduction    Hip internal rotation    Hip external rotation    Knee flexion    Knee extension    Ankle dorsiflexion    Ankle plantarflexion    Ankle inversion    Ankle eversion     (Blank rows = not tested)  LOWER EXTREMITY MMT:  MMT Right Eval 04/05/2024 Left Eval 04/05/2024  Hip flexion 5/5 4+/5  Hip extension 4/5 4/5  Hip abduction    Hip adduction    Hip internal rotation    Hip external rotation    Knee flexion 5/5 5/5  Knee extension 5/5 5/5  Ankle dorsiflexion    Ankle plantarflexion    Ankle inversion    Ankle eversion     (Blank rows = not tested)  SPECIAL TESTS:  SLR: negative; hamstring tightness noted   FUNCTIONAL TESTS:  Not assessed this date   GAIT: Distance walked: not formally assessed  Assistive device utilized: None Level of assistance: Complete Independence Comments:    LUMBAR ROM:   ROM AROM  eval  Flexion Can reach shins, painful  Extension WFL  Right lateral flexion WFL, painful  Left lateral flexion WFL,  painful  Right rotation WFL, painful  Left rotation WFL   (Blank rows = not tested)  TREATMENT DATE:  04/05/2024  TherEx:  HEP handout provided with patient performing one set of each exercise for appropriate form. Verbal cues required.  PATIENT EDUCATION:  Education details: HEP, what to expect from PT, why back pain can radiate into leg  Person educated: Patient Education method: Explanation, Demonstration, Verbal cues, and Handouts Education comprehension: verbalized understanding, returned demonstration, and verbal cues required  HOME EXERCISE PROGRAM: Access Code: [email or web address] URL: [email or web address] Date: 04/05/2024 Prepared by: Susannah Daring  Exercises - Supine Lower Trunk Rotation  - 1 x daily - 7 x weekly - 2-3 sets - 10 reps - 5s hold - Seated Hamstring Stretch  - 1 x daily - 7 x weekly - 2-3 sets - 30s hold - Seated Single Knee to Chest  - 1 x daily - 7 x weekly - 2-3 sets - 30s hold - Seated Figure 4 Piriformis Stretch  - 1 x daily - 7 x weekly - 2-3 sets - 30s hold - Supine Bridge  - 1 x daily - 7 x weekly - 2-3 sets - 10 reps - 5s hold  ASSESSMENT:  CLINICAL IMPRESSION: Patient is a 43 y.o. M who was seen today for physical therapy evaluation and treatment for acute pain of Lt knee as well as low back pain, mainly on Lt, with functional mobility deficits, minor strength deficits, flexibility deficits, and pain. Patient is mainly limited due to low back pain that seems to intermittently radiate into Lt LE. Patient states that he wants to return to physical activity. Patient will benefit from skilled PT to address above noted deficits.   OBJECTIVE IMPAIRMENTS: decreased activity tolerance, decreased mobility, difficulty walking, decreased ROM, decreased strength, hypomobility, impaired flexibility, and pain.   ACTIVITY  LIMITATIONS: bending and standing  PARTICIPATION LIMITATIONS: community activity and occupation  PERSONAL FACTORS: Time since onset of injury/illness/exacerbation are also affecting patient's functional outcome.   REHAB POTENTIAL: Good  CLINICAL DECISION MAKING: Stable/uncomplicated  EVALUATION COMPLEXITY: Low   GOALS: Goals reviewed with patient? Yes  SHORT TERM GOALS: Target date: 04/26/2024 Patient will show compliance with initial HEP.  Baseline: Goal status: INITIAL  2.  Patient will endorse pain level no greater than 5/10 to show improved overall quality of life. Baseline:  Goal status: INITIAL   LONG TERM GOALS: Target date: 05/31/2024  Patient will show compliance and independence with final HEP in order to maintain and progress upon functional gains made in PT. Baseline:  Goal status: INITIAL  2.  Patient will report pain levels no greater than 3/10 to show improved overall quality of life. Baseline:  Goal status: INITIAL  3.  Patient will increase PSFS to at least 7 in order to show significant improvement in self reported disability level.  Baseline:  Goal status: INITIAL  4.  Patient will increase  bilateral hip extension strength to at least 4+/5 in order to show improved biomechanics with functional mobility.  Baseline:  Goal status: INITIAL  5.  Patient will improve bilateral hip flexion AROM to at least 115deg to show improve functional mobility. Baseline:  Goal status: INITIAL    PLAN:  PT FREQUENCY: 1-2x/week  PT DURATION: 8 weeks  PLANNED INTERVENTIONS: 97164- PT Re-evaluation, 97750- Physical Performance Testing, 97110-Therapeutic exercises, 97530- Therapeutic activity, [id number]- Neuromuscular re-education, 97535- Self Care, [id number]- Manual therapy, [id number]- Gait training, [phone]- Electrical stimulation (unattended), [id number]- Electrical stimulation (manual), [id number]- Vasopneumatic device, [id number]- Ultrasound, [id number]- Traction (mechanical), [id number]-  Ionotophoresis 4mg /ml Dexamethasone , 79439 (1-2 muscles), 20561 (3+ muscles)- Dry Needling, Patient/Family education, Balance training,  Stair training, Taping, Joint mobilization, Joint manipulation, Spinal manipulation, Spinal mobilization, Cryotherapy, and Moist heat  PLAN FOR NEXT SESSION: assess leg length, strengthen hip musculature, lumbar endurance assessment, assess hamstring length, knee ROM   Susannah Daring, PT, DPT 04/05/24 2:48 PM

## 2024-04-05 ENCOUNTER — Ambulatory Visit

## 2024-04-05 DIAGNOSIS — M5459 Other low back pain: Secondary | ICD-10-CM

## 2024-04-05 DIAGNOSIS — R2689 Other abnormalities of gait and mobility: Secondary | ICD-10-CM

## 2024-04-05 DIAGNOSIS — M6281 Muscle weakness (generalized): Secondary | ICD-10-CM

## 2024-04-05 DIAGNOSIS — M25662 Stiffness of left knee, not elsewhere classified: Secondary | ICD-10-CM

## 2024-04-05 DIAGNOSIS — M25562 Pain in left knee: Secondary | ICD-10-CM

## 2024-04-21 ENCOUNTER — Ambulatory Visit: Admitting: Rehabilitative and Restorative Service Providers"

## 2024-04-21 ENCOUNTER — Encounter: Payer: Self-pay | Admitting: Rehabilitative and Restorative Service Providers"

## 2024-04-21 DIAGNOSIS — M5459 Other low back pain: Secondary | ICD-10-CM

## 2024-04-21 DIAGNOSIS — M6281 Muscle weakness (generalized): Secondary | ICD-10-CM

## 2024-04-21 DIAGNOSIS — M25562 Pain in left knee: Secondary | ICD-10-CM | POA: Diagnosis not present

## 2024-04-21 DIAGNOSIS — R2689 Other abnormalities of gait and mobility: Secondary | ICD-10-CM | POA: Diagnosis not present

## 2024-04-21 DIAGNOSIS — M25662 Stiffness of left knee, not elsewhere classified: Secondary | ICD-10-CM | POA: Diagnosis not present

## 2024-04-21 NOTE — Therapy (Signed)
 OUTPATIENT PHYSICAL THERAPY LOWER EXTREMITY TREATMENT   Patient Name: Isaac Fletcher MRN: [id number] DOB:1980/11/08, 43 y.o., male Today's Date: 04/21/2024  END OF SESSION:  PT End of Session - 04/21/24 0919     Visit Number 2    Number of Visits 16    Date for PT Re-Evaluation 05/31/24    Authorization Type BCBS $20 COPAY, 21V, AUTH NEEDED    PT Start Time [phone]    PT Stop Time 1015    PT Time Calculation (min) 57 min    Activity Tolerance Patient tolerated treatment well;No increased pain    Behavior During Therapy WFL for tasks assessed/performed           Past Medical History:  Diagnosis Date   Back pain    Constipation    H/O hernia repair    Past Surgical History:  Procedure Laterality Date   HERNIA REPAIR     Herniated Disc     LUMBAR LAMINECTOMY N/A 03/21/2021   Procedure: LEFT LEFT FOUR-FIVE MICRODISCECTOMY;  Surgeon: Barbarann Oneil BROCKS, MD;  Location: [facility] OR;  Service: Orthopedics;  Laterality: N/A;   LUMBAR LAMINECTOMY/DECOMPRESSION MICRODISCECTOMY N/A 02/16/2022   Procedure: LEFT FAR LATERAL APPROACH TO REMOVE LEFT [name] DISC HERNIATION;  Surgeon: Lucilla Lynwood FORBES, MD;  Location: [facility] OR;  Service: Orthopedics;  Laterality: N/A;   Patient Active Problem List   Diagnosis Date Noted   Status post lumbar laminectomy 02/16/2022   Status post lumbar microdiscectomy 04/08/2021   Surgery, elective    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   Lumbar foraminal stenosis 02/19/2021   Trochanteric bursitis, left hip 02/19/2021    PCP: N/A  REFERRING PROVIDER: Ozell DELENA Ada, MD  REFERRING DIAG: [phone] (ICD-10-CM) - Acute pain of left knee  THERAPY DIAG:  Acute pain of left knee  Stiffness of left knee, not elsewhere classified  Muscle weakness (generalized)  Other abnormalities of gait and mobility  Other low back pain  Rationale for Evaluation and Treatment: Rehabilitation  ONSET DATE: since prior to back surgeries (2022, 2023)   SUBJECTIVE:    SUBJECTIVE STATEMENT: [name] reports compliance with his current home exercise program.  He reports instability of the left lower extremity and left sided peripheral symptoms as distal as the shin.  PERTINENT HISTORY: Patient has undergone 2 surgeries on lower back, to include a lumbar laminectomy in 2022 and a lumbar decompression/laminectomy with microdiscectomy in 2023. Patient is currently having low back pain, Lt glute pain, and tingling in the shin/lower leg that intermittently feels like a cramping pain which makes him Lt LE feel heavy. Patient also believes that his legs are different lengths and that going up and down the stairs outside of the home make him feel like he's going to fall. Patient notes seeing a chiropractor.  PAIN:  Are you having pain? Yes: NPRS scale: As high as 5/10 this week   Pain location: Low back, Lt thigh/gluteals, Lt shin  Pain description: stiffness, cramping, pinching, nerve pain   Aggravating factors: standing for prolonged periods of time Relieving factors: lying down, sitting down, resting, tramadol , gabapentin    PRECAUTIONS: None  RED FLAGS: None   WEIGHT BEARING RESTRICTIONS: No  FALLS:  Has patient fallen in last 6 months? 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  DIAGNOSTIC FINDINGS: XRs of the lumbar spine from 11/25/2023 were independently reviewed and  interpreted, showing no significant degenerative changes.  Lordotic  alignment.  No evidence of instability on flexion/extension views.  No  fracture or dislocation seen.   PATIENT SURVEYS:  PSFS: THE PATIENT SPECIFIC FUNCTIONAL  SCALE  Place score of 0-10 (0 = unable to perform activity and 10 = able to perform activity at the same level as before injury or problem)  Activity Date: 04/05/2024    Walking for prolonged periods  5    2.  Stairs  5    3. Lift heavy  5    4.      Total Score 5      Total Score = Sum of activity scores/number of activities  Minimally Detectable Change: 3 points (for single activity); 2 points (for average score)  [facility] (nd). The Patient Specific Functional Scale . Retrieved from [email or web address]   COGNITION: Overall cognitive status: Within functional limits for tasks assessed     SENSATION: Not tested  EDEMA:    MUSCLE LENGTH: Not tested on eval   POSTURE: No Significant postural limitations  PALPATION: Not TTP with palpation to lumbar musculature, lumbar spine, sacrum, PSIS, and bilat glutes   LOWER EXTREMITY ROM:  ROM Right Eval 04/05/2024 Left Eval 04/05/2024 Left/Right 04/21/2024  Hip flexion 100/112; painful  98/108 95/95  Hip extension     Hip abduction     Hip adduction     Hip internal rotation   7/18  Hip external rotation   28/24  Knee flexion     Knee extension     Ankle dorsiflexion     Ankle plantarflexion     Ankle inversion     Ankle eversion     Hamstrings   45/45   (Blank rows = not tested)  LOWER EXTREMITY MMT:  MMT Right Eval 04/05/2024 Left Eval 04/05/2024  Hip flexion 5/5 4+/5  Hip extension 4/5 4/5  Hip abduction    Hip adduction    Hip internal rotation    Hip external rotation    Knee flexion 5/5 5/5  Knee extension 5/5 5/5  Ankle dorsiflexion    Ankle plantarflexion    Ankle inversion    Ankle eversion     (Blank rows = not tested)  SPECIAL TESTS:  SLR: negative; hamstring tightness noted   FUNCTIONAL TESTS:  Not assessed this date   GAIT: Distance walked: not formally assessed  Assistive device utilized: None Level of assistance:  Complete Independence Comments:    LUMBAR ROM:   ROM AROM  eval Left/Right 04/21/2024  Flexion Can reach shins, painful   Extension WFL 10 degrees  Right lateral flexion WFL, painful   Left lateral flexion WFL, painful   Right rotation WFL, painful   Left rotation WFL    (Blank rows = not tested)  TREATMENT DATE:  04/21/2024 Seated knee-to-chest stretch with cues to maintain normal lordosis 2 x 20 seconds Supine single knee-to-chest stretch with other leg straight 4 x 20 seconds bilaterally Seated figure 4 stretch with cues to maintain normal lordosis 2 x 20 seconds Supine figure 4 stretch 4 x 20 seconds bilaterally Seated hamstrings stretch with cues to maintain normal lordosis 2 x 20 seconds Supine hamstrings stretch with other leg straight 4 x 20 seconds bilaterally Standing lumbar extension AROM with emphasis on hips forward in a comfortable range with no exacerbation of peripheral symptoms 2 sets of 10 for 3 seconds Yoga Bridge 10 x 5 seconds Yoga Bridge with march 10 times total (Custer struggled to maintain a level pelvis when marching with his right foot and trying to stabilize his trunk with his left hip and lower back)  97535: Reviewed lifting techniques for home and at the job including golfers and diagonal squat lifts; reviewed correct bed mobility from the techniques including logroll; reviewed spine anatomy with the model and the importance of avoiding flexion and rotation   04/05/2024  TherEx:  HEP handout provided with patient performing one set of each exercise for appropriate form. Verbal cues required.  PATIENT EDUCATION:  Education details: HEP, what to expect from PT, why back pain can radiate into leg  Person educated: Patient Education method: Explanation, Demonstration, Verbal cues, and Handouts Education comprehension:  verbalized understanding, returned demonstration, and verbal cues required  HOME EXERCISE PROGRAM: Access Code: [email or web address] URL: [email or web address] Date: 04/21/2024 Prepared by: Lamar Ivory  Exercises - Supine Lower Trunk Rotation  - 1 x daily - 1 x weekly - 2-3 sets - 10 reps - 5s hold - Seated Hamstring Stretch  - 2 x daily - 7 x weekly - 5 reps - 20 seconds hold - Seated Single Knee to Chest  - 2 x daily - 7 x weekly - 5 reps - 20 seconds hold - Seated Figure 4 Piriformis Stretch  - 2 x daily - 7 x weekly - 5 reps - 20 seconds hold - Supine Bridge  - 2 x daily - 7 x weekly - 2 sets - 10 reps - 5 second hold - Supine Figure 4 Piriformis Stretch  - 2 x daily - 7 x weekly - 1 sets - 5 reps - 20 seconds hold - Single Knee to Chest Stretch  - 2 x daily - 7 x weekly - 1 sets - 5 reps - 20 seconds hold - Supine Hamstring Stretch  - 2 x daily - 7 x weekly - 1 sets - 5 reps - 20 seconds hold  ASSESSMENT:  CLINICAL IMPRESSION: Arlee has a significant history with his spine including 2 previous surgeries for lumbar decompression and microdiscectomy.  Avett is most concerned about left leg instability (quadriceps/proximal thigh weakness) and being able to complete his normal daily activities without being limited by pain.  We reviewed his day 1 home exercise program and I gave him some supine options as he was having a hard time maintaining his normal lumbar lordosis with seated activities secondary to stiffness.  Dametri will benefit from progressions to his lower extremity and low back strength program with special emphasis on quadriceps, hip abductors, quadratus lumborum and erector spinae.  OBJECTIVE IMPAIRMENTS: decreased activity tolerance, decreased mobility, difficulty walking, decreased ROM, decreased strength, hypomobility, impaired flexibility, and pain.   ACTIVITY LIMITATIONS: bending and standing  PARTICIPATION LIMITATIONS: community activity and occupation  PERSONAL  FACTORS: Time since onset of injury/illness/exacerbation are also  affecting patient's functional outcome.   REHAB POTENTIAL: Good  CLINICAL DECISION MAKING: Stable/uncomplicated  EVALUATION COMPLEXITY: Low   GOALS: Goals reviewed with patient? Yes  SHORT TERM GOALS: Target date: 04/26/2024 Patient will show compliance with initial HEP.  Baseline: Goal status: Met 04/21/2024  2.  Patient will endorse pain level no greater than 5/10 to show improved overall quality of life. Baseline:  Goal status: Ongoing 04/21/2024   LONG TERM GOALS: Target date: 05/31/2024  Patient will show compliance and independence with final HEP in order to maintain and progress upon functional gains made in PT. Baseline:  Goal status: INITIAL  2.  Patient will report pain levels no greater than 3/10 to show improved overall quality of life. Baseline:  Goal status: INITIAL  3.  Patient will increase PSFS to at least 7 in order to show significant improvement in self reported disability level.  Baseline:  Goal status: INITIAL  4.  Patient will increase  bilateral hip extension strength to at least 4+/5 in order to show improved biomechanics with functional mobility.  Baseline:  Goal status: INITIAL  5.  Patient will improve bilateral hip flexion AROM to at least 115deg to show improve functional mobility. Baseline:  Goal status: INITIAL    PLAN:  PT FREQUENCY: 1-2x/week  PT DURATION: 8 weeks  PLANNED INTERVENTIONS: 02835- PT Re-evaluation, 97750- Physical Performance Testing, 97110-Therapeutic exercises, 97530- Therapeutic activity, [id number]- Neuromuscular re-education, 97535- Self Care, 02859- Manual therapy, [phone]- Gait training, [phone]- Electrical stimulation (unattended), [phone]- Electrical stimulation (manual), [id number]- Vasopneumatic device, [id number]- Ultrasound, [id number]- Traction (mechanical), [id number]- Ionotophoresis 4mg /ml Dexamethasone , 79439 (1-2 muscles), 20561 (3+ muscles)- Dry Needling,  Patient/Family education, Balance training, Stair training, Taping, Joint mobilization, Joint manipulation, Spinal manipulation, Spinal mobilization, Cryotherapy, and Moist heat  PLAN FOR NEXT SESSION: Review practical body mechanics as needed, quadriceps, hip abductors and low back strength progressions for someone who has had 2 previous back surgeries but has left leg instability, low back pain and and radicular symptoms that should benefit from appropriate stabilization and strength work.  Myer LELON Ivory, PT, MPT 04/21/24 1:26 PM

## 2024-04-25 ENCOUNTER — Ambulatory Visit: Admitting: Rehabilitative and Restorative Service Providers"

## 2024-04-25 ENCOUNTER — Encounter: Payer: Self-pay | Admitting: Rehabilitative and Restorative Service Providers"

## 2024-04-25 DIAGNOSIS — M25562 Pain in left knee: Secondary | ICD-10-CM

## 2024-04-25 DIAGNOSIS — M25662 Stiffness of left knee, not elsewhere classified: Secondary | ICD-10-CM | POA: Diagnosis not present

## 2024-04-25 DIAGNOSIS — M6281 Muscle weakness (generalized): Secondary | ICD-10-CM | POA: Diagnosis not present

## 2024-04-25 DIAGNOSIS — R2689 Other abnormalities of gait and mobility: Secondary | ICD-10-CM | POA: Diagnosis not present

## 2024-04-25 DIAGNOSIS — M5459 Other low back pain: Secondary | ICD-10-CM

## 2024-04-25 NOTE — Therapy (Signed)
 OUTPATIENT PHYSICAL THERAPY TREATMENT   Patient Name: Isaac Fletcher MRN: [id number] DOB:07/24/1981, 43 y.o., male Today's Date: 04/25/2024  END OF SESSION:  PT End of Session - 04/25/24 0927     Visit Number 3    Number of Visits 16    Date for PT Re-Evaluation 05/31/24    Authorization Type BCBS $20 COPAY, 21V, AUTH NEEDED    Authorization - Number of Visits 21    PT Start Time [phone]    PT Stop Time 1010    PT Time Calculation (min) 39 min    Activity Tolerance Patient tolerated treatment well    Behavior During Therapy WFL for tasks assessed/performed            Past Medical History:  Diagnosis Date   Back pain    Constipation    H/O hernia repair    Past Surgical History:  Procedure Laterality Date   HERNIA REPAIR     Herniated Disc     LUMBAR LAMINECTOMY N/A 03/21/2021   Procedure: LEFT LEFT FOUR-FIVE MICRODISCECTOMY;  Surgeon: Barbarann Oneil BROCKS, MD;  Location: [facility] OR;  Service: Orthopedics;  Laterality: N/A;   LUMBAR LAMINECTOMY/DECOMPRESSION MICRODISCECTOMY N/A 02/16/2022   Procedure: LEFT FAR LATERAL APPROACH TO REMOVE LEFT [name] DISC HERNIATION;  Surgeon: Lucilla Lynwood FORBES, MD;  Location: [facility] OR;  Service: Orthopedics;  Laterality: N/A;   Patient Active Problem List   Diagnosis Date Noted   Status post lumbar laminectomy 02/16/2022   Status post lumbar microdiscectomy 04/08/2021   Surgery, elective    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   Lumbar foraminal stenosis 02/19/2021   Trochanteric bursitis, left hip 02/19/2021    PCP: N/A  REFERRING PROVIDER: Ozell DELENA Ada, MD  REFERRING DIAG: [phone] (ICD-10-CM) - Acute pain of left knee  THERAPY DIAG:  Acute pain of left knee  Stiffness of left knee, not elsewhere classified  Muscle weakness (generalized)  Other abnormalities of gait and mobility  Other low back pain  Rationale for Evaluation and Treatment: Rehabilitation  ONSET DATE: since prior to back surgeries (2022, 2023)    SUBJECTIVE:   SUBJECTIVE STATEMENT: Pt indicated about the same since first visit back.  Reported complaints in Lt back/hip and numbness/tingling in anterior shin.  Reported he felt like his Rt leg is shorter.   In person interpreter in visit.   PERTINENT HISTORY: Patient has undergone 2 surgeries on lower back, to include a lumbar laminectomy in 2022 and a lumbar decompression/laminectomy with microdiscectomy in 2023. Patient is currently having low back pain, Lt glute pain, and tingling in the shin/lower leg that intermittently feels like a cramping pain which makes him Lt LE feel heavy. Patient also believes that his legs are different lengths and that going up and down the stairs outside of the home make him feel like he's going to fall. Patient notes seeing a chiropractor.    PAIN:   NPRS scale: 6/10 upon arrival  Pain location: Low back, Lt thigh/gluteals, Lt shin  Pain description: stiffness, cramping, pinching, nerve pain   Aggravating factors: standing for prolonged periods of time Relieving factors: lying down, sitting down, resting, tramadol , gabapentin    PRECAUTIONS: None  RED FLAGS: None   WEIGHT BEARING RESTRICTIONS: No  FALLS:  Has patient fallen in last 6 months? 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  DIAGNOSTIC FINDINGS: XRs of the lumbar spine from 11/25/2023 were independently reviewed and  interpreted, showing no significant degenerative changes.  Lordotic  alignment.  No evidence of instability on flexion/extension views.  No  fracture or dislocation seen.   PATIENT SURVEYS:  PSFS: THE PATIENT  SPECIFIC FUNCTIONAL SCALE  Place score of 0-10 (0 = unable to perform activity and 10 = able to perform activity at the same level as before injury or problem)  Activity Date: 04/05/2024    Walking for prolonged periods  5    2.  Stairs  5    3. Lift heavy  5    4.      Total Score 5      Total Score = Sum of activity scores/number of activities  Minimally Detectable Change: 3 points (for single activity); 2 points (for average score)   COGNITION: Eval Overall cognitive status: Within functional limits for tasks assessed     SENSATION: Eval Not tested  MUSCLE LENGTH: Eval Not tested on eval   POSTURE:  04/25/2024: Standing posture revealed Rt iliac lower than Lt.  Corrected with shoe insert provided.    Eval: No Significant postural limitations   PALPATION: 04/25/2024: Trigger points in Lt QL, Lt glute max/med.    Eval Not TTP with palpation to lumbar musculature, lumbar spine, sacrum, PSIS, and bilat glutes   LOWER EXTREMITY ROM:  ROM Right Eval 04/05/2024 Left Eval 04/05/2024 Left/Right 04/21/2024  Hip flexion 100/112; painful  98/108 95/95  Hip extension     Hip abduction     Hip adduction     Hip internal rotation   7/18  Hip external rotation   28/24  Knee flexion     Knee extension     Ankle dorsiflexion     Ankle plantarflexion     Ankle inversion     Ankle eversion     Hamstrings   45/45   (Blank rows = not tested)  LOWER EXTREMITY MMT:  MMT Right Eval 04/05/2024 Left Eval 04/05/2024  Hip flexion 5/5 4+/5  Hip extension 4/5 4/5  Hip abduction    Hip adduction    Hip internal rotation    Hip external rotation    Knee flexion 5/5 5/5  Knee extension 5/5 5/5  Ankle dorsiflexion    Ankle plantarflexion    Ankle inversion    Ankle eversion     (Blank rows = not tested)  SPECIAL TESTS:  04/25/2024: Lt leg length in supine : 35.5 inch, Rt leg 35.25 inch.    Eval SLR: negative; hamstring tightness noted   FUNCTIONAL TESTS:  Not  assessed this date   GAIT: Eval:  Distance walked: not formally assessed  Assistive device utilized: None Level of assistance: Complete Independence Comments:    LUMBAR ROM:   ROM AROM  eval 04/21/2024 AROM 04/25/2024 AROM  Flexion Can reach shins, painful    Extension WFL 10 degrees 50% WFL , no change in leg symptoms.   Right lateral flexion WFL, painful    Left lateral flexion WFL, painful    Right rotation WFL, painful    Left rotation WFL     (Blank rows = not tested)                       TREATMENT  DATE: 04/25/2024 Therex: Supine hooklying trunk rotation stretch 15 sec x 3 bilateral Standing lumbar extension AROM x 5  Supine bridge x 15 (stopped due to pain complaints in buttock/back) Supine SKC 30 sec x 3 bilateral Sit to stand 18 inch chair x 5 without UE Seated SLR with focus on back/core stability x 10 bilateral with slow lowering focus   Manual Percussive device to Lt QL, Lt glute max/med   Self Care Education and instruction of leg length difference and use of shoe insert on Rt leg to help balance.                                                                                                                                TREATMENT         DATE: 04/21/2024 Seated knee-to-chest stretch with cues to maintain normal lordosis 2 x 20 seconds Supine single knee-to-chest stretch with other leg straight 4 x 20 seconds bilaterally Seated figure 4 stretch with cues to maintain normal lordosis 2 x 20 seconds Supine figure 4 stretch 4 x 20 seconds bilaterally Seated hamstrings stretch with cues to maintain normal lordosis 2 x 20 seconds Supine hamstrings stretch with other leg straight 4 x 20 seconds bilaterally Standing lumbar extension AROM with emphasis on hips forward in a comfortable range with no exacerbation of peripheral symptoms 2 sets of 10 for 3 seconds Yoga Bridge 10 x 5 seconds Yoga Bridge with march 10 times total (Maysen struggled to maintain a  level pelvis when marching with his right foot and trying to stabilize his trunk with his left hip and lower back)  97535: Reviewed lifting techniques for home and at the job including golfers and diagonal squat lifts; reviewed correct bed mobility from the techniques including logroll; reviewed spine anatomy with the model and the importance of avoiding flexion and rotation   TREATMENT         DATE: 04/05/2024  TherEx:  HEP handout provided with patient performing one set of each exercise for appropriate form. Verbal cues required.  PATIENT EDUCATION:  04/25/2024  Education details: HEP update Person educated: Patient Education method: Programmer, multimedia, Demonstration, Verbal cues, and Handouts Education comprehension: verbalized understanding, returned demonstration, and verbal cues required  HOME EXERCISE PROGRAM: Access Code: [email or web address] URL: [email or web address] Date: 04/25/2024 Prepared by: Ozell Silvan  Exercises - Supine Lower Trunk Rotation  - 1 x daily - 1 x weekly - 2-3 sets - 10 reps - 5s hold - Seated Hamstring Stretch  - 2 x daily - 7 x weekly - 5 reps - 20 seconds hold - Supine Bridge  - 2 x daily - 7 x weekly - 2 sets - 10 reps - 5 second hold - Supine Figure 4 Piriformis Stretch  - 2 x daily - 7 x weekly - 1 sets - 5 reps - 20 seconds hold - Single Knee to Chest Stretch  - 2 x daily - 7 x weekly -  1 sets - 5 reps - 20 seconds hold - Supine Hamstring Stretch  - 2 x daily - 7 x weekly - 1 sets - 5 reps - 20 seconds hold - Standing Lumbar Extension  - 2-4 x daily - 7 x weekly - 1 sets - 5-10 reps - Seated SLR  - 1-2 x daily - 7 x weekly - 1-2 sets - 10-15 reps - 2 hold  ASSESSMENT:  CLINICAL IMPRESSION: Recheck of posture in standing showed indications of possible Rt leg length deficit.  Confirmed at .25 inch in supine measurement.  Applied foam insert heel lift for Rt leg which corrected alignment in standing.  Lumbar mobility still showed some limitation in  extension which may impact upright standing posture /walking tolerance.  Continued skilled PT services indicated at this time.   OBJECTIVE IMPAIRMENTS: decreased activity tolerance, decreased mobility, difficulty walking, decreased ROM, decreased strength, hypomobility, impaired flexibility, and pain.   ACTIVITY LIMITATIONS: bending and standing  PARTICIPATION LIMITATIONS: community activity and occupation  PERSONAL FACTORS: Time since onset of injury/illness/exacerbation are also affecting patient's functional outcome.   REHAB POTENTIAL: Good  CLINICAL DECISION MAKING: Stable/uncomplicated  EVALUATION COMPLEXITY: Low   GOALS: Goals reviewed with patient? Yes  SHORT TERM GOALS: Target date: 04/26/2024 Patient will show compliance with initial HEP.  Baseline: Goal status: Met 04/21/2024  2.  Patient will endorse pain level no greater than 5/10 to show improved overall quality of life. Baseline:  Goal status: not met   LONG TERM GOALS: Target date: 05/31/2024  Patient will show compliance and independence with final HEP in order to maintain and progress upon functional gains made in PT. Baseline:  Goal status: INITIAL  2.  Patient will report pain levels no greater than 3/10 to show improved overall quality of life. Baseline:  Goal status: INITIAL  3.  Patient will increase PSFS to at least 7 in order to show significant improvement in self reported disability level.  Baseline:  Goal status: INITIAL  4.  Patient will increase  bilateral hip extension strength to at least 4+/5 in order to show improved biomechanics with functional mobility.  Baseline:  Goal status: INITIAL  5.  Patient will improve bilateral hip flexion AROM to at least 115deg to show improve functional mobility. Baseline:  Goal status: INITIAL    PLAN:  PT FREQUENCY: 1-2x/week  PT DURATION: 8 weeks  PLANNED INTERVENTIONS: 97164- PT Re-evaluation, 97750- Physical Performance Testing,  97110-Therapeutic exercises, 97530- Therapeutic activity, [id number]- Neuromuscular re-education, 97535- Self Care, 02859- Manual therapy, [id number]- Gait training, [phone]- Electrical stimulation (unattended), [phone]- Electrical stimulation (manual), [id number]- Vasopneumatic device, [id number]- Ultrasound, [id number]- Traction (mechanical), [id number]- Ionotophoresis 4mg /ml Dexamethasone , 79439 (1-2 muscles), 20561 (3+ muscles)- Dry Needling, Patient/Family education, Balance training, Stair training, Taping, Joint mobilization, Joint manipulation, Spinal manipulation, Spinal mobilization, Cryotherapy, and Moist heat  PLAN FOR NEXT SESSION:  Recheck shoe lift insert to Rt leg to address leg length difference.   Ozell Silvan, PT, DPT, OCS, ATC 04/25/24  10:09 AM

## 2024-05-03 ENCOUNTER — Ambulatory Visit: Admitting: Rehabilitative and Restorative Service Providers"

## 2024-05-03 ENCOUNTER — Encounter: Payer: Self-pay | Admitting: Rehabilitative and Restorative Service Providers"

## 2024-05-03 DIAGNOSIS — M6281 Muscle weakness (generalized): Secondary | ICD-10-CM

## 2024-05-03 DIAGNOSIS — M25562 Pain in left knee: Secondary | ICD-10-CM | POA: Diagnosis not present

## 2024-05-03 DIAGNOSIS — M5459 Other low back pain: Secondary | ICD-10-CM

## 2024-05-03 DIAGNOSIS — R2689 Other abnormalities of gait and mobility: Secondary | ICD-10-CM | POA: Diagnosis not present

## 2024-05-03 DIAGNOSIS — M25662 Stiffness of left knee, not elsewhere classified: Secondary | ICD-10-CM | POA: Diagnosis not present

## 2024-05-03 NOTE — Therapy (Signed)
 OUTPATIENT PHYSICAL THERAPY TREATMENT   Patient Name: Isaac Fletcher MRN: [id number] DOB:06/04/1981, 43 y.o., male Today's Date: 05/03/2024  END OF SESSION:  PT End of Session - 05/03/24 0930     Visit Number 4    Number of Visits 16    Date for PT Re-Evaluation 05/31/24    Authorization Type [employer] $20 COPAY, 21V, AUTH NEEDED    Authorization - Number of Visits 21    PT Start Time 0930    PT Stop Time 1010    PT Time Calculation (min) 40 min    Activity Tolerance Patient tolerated treatment well    Behavior During Therapy WFL for tasks assessed/performed             Past Medical History:  Diagnosis Date   Back pain    Constipation    H/O hernia repair    Past Surgical History:  Procedure Laterality Date   HERNIA REPAIR     Herniated Disc     LUMBAR LAMINECTOMY N/A 03/21/2021   Procedure: LEFT LEFT FOUR-FIVE MICRODISCECTOMY;  Surgeon: Barbarann Oneil BROCKS, MD;  Location: [facility] OR;  Service: Orthopedics;  Laterality: N/A;   LUMBAR LAMINECTOMY/DECOMPRESSION MICRODISCECTOMY N/A 02/16/2022   Procedure: LEFT FAR LATERAL APPROACH TO REMOVE LEFT [name] DISC HERNIATION;  Surgeon: Lucilla Lynwood FORBES, MD;  Location: [facility] OR;  Service: Orthopedics;  Laterality: N/A;   Patient Active Problem List   Diagnosis Date Noted   Status post lumbar laminectomy 02/16/2022   Status post lumbar microdiscectomy 04/08/2021   Surgery, elective    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  Reported complaints in Lt back/hip and numbness/tingling in anterior shin.  Reported he felt like his Rt leg is shorter.   In person interpreter in visit.   PERTINENT HISTORY: Patient has undergone 2 surgeries on lower back, to include a lumbar laminectomy in 2022 and a lumbar decompression/laminectomy with microdiscectomy in 2023. Patient is currently having low back pain, Lt glute pain, and tingling in the shin/lower leg that intermittently feels like a cramping pain which makes him Lt LE feel heavy. Patient also believes that his legs are different lengths and that going up and down the stairs outside of the home make him feel like he's going to fall. Patient notes seeing a chiropractor.    PAIN:   NPRS scale: 6/10 upon arrival  Pain location: Low back, Lt thigh/gluteals, Lt shin  Pain description: stiffness, cramping, pinching, nerve pain   Aggravating factors: standing for prolonged periods of time Relieving factors: lying down, sitting down, resting, tramadol , gabapentin    PRECAUTIONS: None  RED FLAGS: None   WEIGHT BEARING RESTRICTIONS: No  FALLS:  Has patient fallen in last 6 months? 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  DIAGNOSTIC FINDINGS: XRs of the lumbar spine from 11/25/2023 were independently reviewed and  interpreted, showing no significant degenerative changes.  Lordotic  alignment.  No evidence of instability on flexion/extension views.  No  fracture or dislocation seen.   PATIENT SURVEYS:  PSFS: THE PATIENT  SPECIFIC FUNCTIONAL SCALE  Place score of 0-10 (0 = unable to perform activity and 10 = able to perform activity at the same level as before injury or problem)  Activity Date: 04/05/2024    Walking for prolonged periods  5    2.  Stairs  5    3. Lift heavy  5    4.      Total Score 5      Total Score = Sum of activity scores/number of activities  Minimally Detectable Change: 3 points (for single activity); 2 points (for average score)   COGNITION: Eval Overall cognitive status: Within functional limits for tasks assessed     SENSATION: Eval Not tested  MUSCLE LENGTH: Eval Not tested on eval   POSTURE:  04/25/2024: Standing posture revealed Rt iliac lower than Lt.  Corrected with shoe insert provided.    Eval: No Significant postural limitations   PALPATION: 04/25/2024: Trigger points in Lt QL, Lt glute max/med.    Eval Not TTP with palpation to lumbar musculature, lumbar spine, sacrum, PSIS, and bilat glutes   LOWER EXTREMITY ROM:  ROM Right Eval 04/05/2024 Left Eval 04/05/2024 Left/Right 04/21/2024  Hip flexion 100/112; painful  98/108 95/95  Hip extension     Hip abduction     Hip adduction     Hip internal rotation   7/18  Hip external rotation   28/24  Knee flexion     Knee extension     Ankle dorsiflexion     Ankle plantarflexion     Ankle inversion     Ankle eversion     Hamstrings   45/45   (Blank rows = not tested)  LOWER EXTREMITY MMT:  MMT Right Eval 04/05/2024 Left Eval 04/05/2024  Hip flexion 5/5 4+/5  Hip extension 4/5 4/5  Hip abduction    Hip adduction    Hip internal rotation    Hip external rotation    Knee flexion 5/5 5/5  Knee extension 5/5 5/5  Ankle dorsiflexion    Ankle plantarflexion    Ankle inversion    Ankle eversion     (Blank rows = not tested)  SPECIAL TESTS:  04/25/2024: Lt leg length in supine : 35.5 inch, Rt leg 35.25 inch.    Eval SLR: negative; hamstring tightness noted   FUNCTIONAL TESTS:  Not  assessed this date   GAIT: Eval:  Distance walked: not formally assessed  Assistive device utilized: None Level of assistance: Complete Independence Comments:    LUMBAR ROM:   ROM AROM  eval 04/21/2024 AROM 04/25/2024 AROM 05/03/2024  Flexion Can reach shins, painful     Extension WFL 10 degrees 50% WFL , no change in leg symptoms.  100% WFL without complaints.   Right lateral flexion WFL, painful     Left lateral flexion WFL, painful     Right rotation WFL, painful     Left rotation WFL      (Blank rows = not tested)  TREATMENT         DATE: 05/03/2024 Manual Percussive device to Lt QL, Lt glute max/med, Lt quad, lateral thigh  Therex: Supine hook lying lumbar trunk rotation 15 sec x 3 bilateral  Supine figure 4 pull towards stretch 30 sec x 3 bilateral  Supine hook lying bridge 2 x 10  Sidelying clam shell  2 x 15 , performed bilaterally  Seated quad set with SLR x 15 bilateral    TherActivity(to improve squatting, transfers, stairs, ambulation) Leg press double leg x 15, 75 lbs Leg press single leg 37 lbs 2 x 15 bilateral       TREATMENT         DATE: 04/25/2024 Therex: Supine hooklying trunk rotation stretch 15 sec x 3 bilateral Standing lumbar extension AROM x 5  Supine bridge x 15 (stopped due to pain complaints in buttock/back) Supine SKC 30 sec x 3 bilateral Sit to stand 18 inch chair x 5 without UE Seated SLR with focus on back/core stability x 10 bilateral with slow lowering focus   Manual Percussive device to Lt QL, Lt glute max/med   Self Care Education and instruction of leg length difference and use of shoe insert on Rt leg to help balance.                                                                                                                                TREATMENT         DATE: 04/21/2024 Seated knee-to-chest stretch with cues to maintain normal lordosis 2 x 20 seconds Supine single knee-to-chest stretch with  other leg straight 4 x 20 seconds bilaterally Seated figure 4 stretch with cues to maintain normal lordosis 2 x 20 seconds Supine figure 4 stretch 4 x 20 seconds bilaterally Seated hamstrings stretch with cues to maintain normal lordosis 2 x 20 seconds Supine hamstrings stretch with other leg straight 4 x 20 seconds bilaterally Standing lumbar extension AROM with emphasis on hips forward in a comfortable range with no exacerbation of peripheral symptoms 2 sets of 10 for 3 seconds Yoga Bridge 10 x 5 seconds Yoga Bridge with march 10 times total (Mubashir struggled to maintain a level pelvis when marching with his right foot and trying to stabilize his trunk with his left hip and lower back)  [id number]: Reviewed lifting techniques for home and at the job including golfers and diagonal squat lifts; reviewed correct bed mobility from the techniques including logroll; reviewed spine anatomy with the model and the importance of avoiding flexion and rotation   TREATMENT         DATE: 04/05/2024  TherEx:  HEP handout provided with patient performing one set of each exercise for appropriate form. Verbal cues required.  PATIENT EDUCATION:  04/25/2024  Education details: HEP update Person educated: Patient Education method: Programmer, multimedia, Demonstration, Verbal cues, and Handouts Education comprehension: verbalized understanding, returned demonstration, and verbal cues required  HOME EXERCISE PROGRAM:  Access Code: [email or web address] URL: [email or web address] Date: 04/25/2024 Prepared by: Ozell Silvan  Exercises - Supine Lower Trunk Rotation  - 1 x daily - 1 x weekly - 2-3 sets - 10 reps - 5s hold - Seated Hamstring Stretch  - 2 x daily - 7 x weekly - 5 reps - 20 seconds hold - Supine Bridge  - 2 x daily - 7 x weekly - 2 sets - 10 reps - 5 second hold - Supine Figure 4 Piriformis Stretch  - 2 x daily - 7 x weekly - 1 sets - 5 reps - 20 seconds hold - Single Knee to Chest Stretch  - 2 x daily - 7 x  weekly - 1 sets - 5 reps - 20 seconds hold - Supine Hamstring Stretch  - 2 x daily - 7 x weekly - 1 sets - 5 reps - 20 seconds hold - Standing Lumbar Extension  - 2-4 x daily - 7 x weekly - 1 sets - 5-10 reps - Seated SLR  - 1-2 x daily - 7 x weekly - 1-2 sets - 10-15 reps - 2 hold  ASSESSMENT:  CLINICAL IMPRESSION: Fatigue in Lt leg noted in strengthening intervention. Bridging today without pain complaints.  Continued benefits of focus on mobility gains for back/hips and Lt leg strengthening to help promote improved functional activity tolerance.   OBJECTIVE IMPAIRMENTS: decreased activity tolerance, decreased mobility, difficulty walking, decreased ROM, decreased strength, hypomobility, impaired flexibility, and pain.   ACTIVITY LIMITATIONS: bending and standing  PARTICIPATION LIMITATIONS: community activity and occupation  PERSONAL FACTORS: Time since onset of injury/illness/exacerbation are also affecting patient's functional outcome.   REHAB POTENTIAL: Good  CLINICAL DECISION MAKING: Stable/uncomplicated  EVALUATION COMPLEXITY: Low   GOALS: Goals reviewed with patient? Yes  SHORT TERM GOALS: Target date: 04/26/2024 Patient will show compliance with initial HEP.  Baseline: Goal status: Met 04/21/2024  2.  Patient will endorse pain level no greater than 5/10 to show improved overall quality of life. Baseline:  Goal status: not met   LONG TERM GOALS: Target date: 05/31/2024  Patient will show compliance and independence with final HEP in order to maintain and progress upon functional gains made in PT. Baseline:  Goal status: on going 05/03/2024  2.  Patient will report pain levels no greater than 3/10 to show improved overall quality of life. Baseline:  Goal status: on going 05/03/2024  3.  Patient will increase PSFS to at least 7 in order to show significant improvement in self reported disability level.  Baseline:  Goal status: on going 05/03/2024  4.  Patient will  increase  bilateral hip extension strength to at least 4+/5 in order to show improved biomechanics with functional mobility.  Baseline:  Goal status: on going 05/03/2024  5.  Patient will improve bilateral hip flexion AROM to at least 115deg to show improve functional mobility. Baseline:  Goal status: on going 05/03/2024    PLAN:  PT FREQUENCY: 1-2x/week  PT DURATION: 8 weeks  PLANNED INTERVENTIONS: 97164- PT Re-evaluation, 97750- Physical Performance Testing, 97110-Therapeutic exercises, 97530- Therapeutic activity, [id number]- Neuromuscular re-education, 97535- Self Care, [id number]- Manual therapy, [id number]- Gait training, [phone]- Electrical stimulation (unattended), [phone]- Electrical stimulation (manual), [id number]- Vasopneumatic device, [id number]- Ultrasound, [id number]- Traction (mechanical), [id number]- Ionotophoresis 4mg /ml Dexamethasone , 79439 (1-2 muscles), [id number] (3+ muscles)- Dry Needling, Patient/Family education, Balance training, Stair training, Taping, Joint mobilization, Joint manipulation, Spinal manipulation, Spinal mobilization, Cryotherapy, and Moist heat  PLAN FOR NEXT SESSION:   Progressive LE strengthening, mobility gains.   Makailey Hodgkin  Brien, PT, DPT, OCS, ATC 05/03/24  10:17 AM

## 2024-05-10 ENCOUNTER — Encounter: Admitting: Rehabilitative and Restorative Service Providers"

## 2024-05-17 ENCOUNTER — Encounter: Payer: Self-pay | Admitting: Rehabilitative and Restorative Service Providers"

## 2024-05-17 ENCOUNTER — Ambulatory Visit: Admitting: Rehabilitative and Restorative Service Providers"

## 2024-05-17 DIAGNOSIS — M6281 Muscle weakness (generalized): Secondary | ICD-10-CM | POA: Diagnosis not present

## 2024-05-17 DIAGNOSIS — R2689 Other abnormalities of gait and mobility: Secondary | ICD-10-CM

## 2024-05-17 DIAGNOSIS — M5459 Other low back pain: Secondary | ICD-10-CM

## 2024-05-17 DIAGNOSIS — M25662 Stiffness of left knee, not elsewhere classified: Secondary | ICD-10-CM | POA: Diagnosis not present

## 2024-05-17 DIAGNOSIS — M25562 Pain in left knee: Secondary | ICD-10-CM

## 2024-05-17 NOTE — Therapy (Signed)
 OUTPATIENT PHYSICAL THERAPY TREATMENT   Patient Name: Isaac Fletcher MRN: [id number] DOB:19-Feb-1981, 43 y.o., male Today's Date: 05/17/2024  END OF SESSION:  PT End of Session - 05/17/24 0943     Visit Number 5    Number of Visits 16    Date for PT Re-Evaluation 05/31/24    Authorization Type [employer] $20 COPAY, 21V, AUTH NEEDED    Authorization - Number of Visits 21    PT Start Time 0930    PT Stop Time 1010    PT Time Calculation (min) 40 min    Activity Tolerance Patient tolerated treatment well    Behavior During Therapy WFL for tasks assessed/performed              Past Medical History:  Diagnosis Date   Back pain    Constipation    H/O hernia repair    Past Surgical History:  Procedure Laterality Date   HERNIA REPAIR     Herniated Disc     LUMBAR LAMINECTOMY N/A 03/21/2021   Procedure: LEFT LEFT FOUR-FIVE MICRODISCECTOMY;  Surgeon: Barbarann Oneil BROCKS, MD;  Location: [facility] OR;  Service: Orthopedics;  Laterality: N/A;   LUMBAR LAMINECTOMY/DECOMPRESSION MICRODISCECTOMY N/A 02/16/2022   Procedure: LEFT FAR LATERAL APPROACH TO REMOVE LEFT [name] DISC HERNIATION;  Surgeon: Lucilla Lynwood FORBES, MD;  Location: [facility] OR;  Service: Orthopedics;  Laterality: N/A;   Patient Active Problem List   Diagnosis Date Noted   Status post lumbar laminectomy 02/16/2022   Status post lumbar microdiscectomy 04/08/2021   Surgery, elective    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   Lumbar foraminal stenosis 02/19/2021   Trochanteric bursitis, left hip 02/19/2021    PCP: N/A  REFERRING PROVIDER: Ozell DELENA Ada, MD  REFERRING DIAG: [phone] (ICD-10-CM) - Acute pain of left knee  THERAPY DIAG:  Acute pain of left knee  Stiffness of left knee, not elsewhere classified  Muscle weakness (generalized)  Other abnormalities of gait and mobility  Other low back pain  Rationale for Evaluation and Treatment: Rehabilitation  ONSET DATE: since prior to back surgeries (2022, 2023)    SUBJECTIVE:   SUBJECTIVE STATEMENT: Pt indicated no pain this morning, only tightness Reported leg can still feel heavy.   In person interpreter in visit.   PERTINENT HISTORY: Patient has undergone 2 surgeries on lower back, to include a lumbar laminectomy in 2022 and a lumbar decompression/laminectomy with microdiscectomy in 2023. Patient is currently having low back pain, Lt glute pain, and tingling in the shin/lower leg that intermittently feels like a cramping pain which makes him Lt LE feel heavy. Patient also believes that his legs are different lengths and that going up and down the stairs outside of the home make him feel like he's going to fall. Patient notes seeing a chiropractor.    PAIN:   NPRS scale:  no pain upon arrival.  Pain location: Low back, Lt thigh/gluteals, Lt shin  Pain description: stiffness, cramping, pinching, nerve pain   Aggravating factors: standing for prolonged periods of time Relieving factors: lying down, sitting down, resting, tramadol , gabapentin    PRECAUTIONS: None  RED FLAGS: None   WEIGHT BEARING RESTRICTIONS: No  FALLS:  Has patient fallen in last 6 months? 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  DIAGNOSTIC FINDINGS: XRs of the lumbar spine from 11/25/2023 were independently reviewed and  interpreted, showing no significant degenerative changes.  Lordotic  alignment.  No evidence of instability on flexion/extension views.  No  fracture or dislocation seen.   PATIENT SURVEYS:  PSFS: THE PATIENT SPECIFIC FUNCTIONAL SCALE  Place score of 0-10 (0 = unable to perform activity and 10  = able to perform activity at the same level as before injury or problem)  Activity Date: 04/05/2024    Walking for prolonged periods  5    2.  Stairs  5    3. Lift heavy  5    4.      Total Score 5      Total Score = Sum of activity scores/number of activities  Minimally Detectable Change: 3 points (for single activity); 2 points (for average score)   COGNITION: Eval Overall cognitive status: Within functional limits for tasks assessed     SENSATION: Eval Not tested  MUSCLE LENGTH: Eval Not tested on eval   POSTURE:  04/25/2024: Standing posture revealed Rt iliac lower than Lt.  Corrected with shoe insert provided.    Eval: No Significant postural limitations   PALPATION: 04/25/2024: Trigger points in Lt QL, Lt glute max/med.    Eval Not TTP with palpation to lumbar musculature, lumbar spine, sacrum, PSIS, and bilat glutes   LOWER EXTREMITY ROM:  ROM Right Eval 04/05/2024 Left Eval 04/05/2024 Left/Right 04/21/2024  Hip flexion 100/112; painful  98/108 95/95  Hip extension     Hip abduction     Hip adduction     Hip internal rotation   7/18  Hip external rotation   28/24  Knee flexion     Knee extension     Ankle dorsiflexion     Ankle plantarflexion     Ankle inversion     Ankle eversion     Hamstrings   45/45   (Blank rows = not tested)  LOWER EXTREMITY MMT:  MMT Right Eval 04/05/2024 Left Eval 04/05/2024  Hip flexion 5/5 4+/5  Hip extension 4/5 4/5  Hip abduction    Hip adduction    Hip internal rotation    Hip external rotation    Knee flexion 5/5 5/5  Knee extension 5/5 5/5  Ankle dorsiflexion    Ankle plantarflexion    Ankle inversion    Ankle eversion     (Blank rows = not tested)  SPECIAL TESTS:  04/25/2024: Lt leg length in supine : 35.5 inch, Rt leg 35.25 inch.    Eval SLR: negative; hamstring tightness noted   FUNCTIONAL TESTS:  Not assessed this date   GAIT: Eval:  Distance walked: not formally assessed  Assistive  device utilized: None Level of assistance: Complete Independence Comments:    LUMBAR ROM:   ROM AROM  eval 04/21/2024 AROM 04/25/2024 AROM 05/03/2024  Flexion Can reach shins, painful     Extension WFL 10 degrees 50% WFL , no change in leg symptoms.  100% WFL without complaints.   Right lateral flexion WFL, painful     Left lateral flexion WFL, painful     Right rotation WFL, painful     Left rotation WFL      (Blank rows = not tested)                       TREATMENT  DATE: 05/17/2024 Therex: UBE LE only seat 13 for ROM, endurance with interval training faster :10 seconds at top of each minute.  10 mins total lvl 4.0  Seated quad set with SLR slowly with 1-2 pause in lift 2 x 15 bilateral  Supine thomas stretch Lt hip flexors with Rt knee pulled towards chest 15 sec x 5    Manual: Percussive device to Lt glute max/med, Lt quad, lateral thigh   TherActivity(to improve squatting, transfers, stairs, ambulation) Leg press double leg x 15, 75 lbs, x 15 87 lbs Leg press single leg 43 lbs 2 x 15 bilateral     TREATMENT         DATE: 05/03/2024 Manual Percussive device to Lt QL, Lt glute max/med, Lt quad, lateral thigh  Therex: Supine hook lying lumbar trunk rotation 15 sec x 3 bilateral  Supine figure 4 pull towards stretch 30 sec x 3 bilateral  Supine hook lying bridge 2 x 10  Sidelying clam shell  2 x 15 , performed bilaterally  Seated quad set with SLR x 15 bilateral    TherActivity(to improve squatting, transfers, stairs, ambulation) Leg press double leg x 15, 75 lbs Leg press single leg 37 lbs 2 x 15 bilateral       TREATMENT         DATE: 04/25/2024 Therex: Supine hooklying trunk rotation stretch 15 sec x 3 bilateral Standing lumbar extension AROM x 5  Supine bridge x 15 (stopped due to pain complaints in buttock/back) Supine SKC 30 sec x 3 bilateral Sit to stand 18 inch chair x 5 without UE Seated SLR with focus on back/core stability x 10 bilateral  with slow lowering focus   Manual Percussive device to Lt QL, Lt glute max/med   Self Care Education and instruction of leg length difference and use of shoe insert on Rt leg to help balance.    PATIENT EDUCATION:  04/25/2024  Education details: HEP update Person educated: Patient Education method: Programmer, multimedia, Demonstration, Verbal cues, and Handouts Education comprehension: verbalized understanding, returned demonstration, and verbal cues required  HOME EXERCISE PROGRAM: Access Code: [email or web address] URL: [email or web address] Date: 04/25/2024 Prepared by: Ozell Silvan  Exercises - Supine Lower Trunk Rotation  - 1 x daily - 1 x weekly - 2-3 sets - 10 reps - 5s hold - Seated Hamstring Stretch  - 2 x daily - 7 x weekly - 5 reps - 20 seconds hold - Supine Bridge  - 2 x daily - 7 x weekly - 2 sets - 10 reps - 5 second hold - Supine Figure 4 Piriformis Stretch  - 2 x daily - 7 x weekly - 1 sets - 5 reps - 20 seconds hold - Single Knee to Chest Stretch  - 2 x daily - 7 x weekly - 1 sets - 5 reps - 20 seconds hold - Supine Hamstring Stretch  - 2 x daily - 7 x weekly - 1 sets - 5 reps - 20 seconds hold - Standing Lumbar Extension  - 2-4 x daily - 7 x weekly - 1 sets - 5-10 reps - Seated SLR  - 1-2 x daily - 7 x weekly - 1-2 sets - 10-15 reps - 2 hold  ASSESSMENT:  CLINICAL IMPRESSION: Continued general fatigue was noted and reported in time since last visit.  Still reporting less pain overall than prior times.  Has one more approved [employer] visit remaining.   OBJECTIVE IMPAIRMENTS: decreased activity tolerance, decreased mobility, difficulty walking, decreased ROM, decreased  strength, hypomobility, impaired flexibility, and pain.   ACTIVITY LIMITATIONS: bending and standing  PARTICIPATION LIMITATIONS: community activity and occupation  PERSONAL FACTORS: Time since onset of injury/illness/exacerbation are also affecting patient's functional outcome.   REHAB POTENTIAL:  Good  CLINICAL DECISION MAKING: Stable/uncomplicated  EVALUATION COMPLEXITY: Low   GOALS: Goals reviewed with patient? Yes  SHORT TERM GOALS: Target date: 04/26/2024 Patient will show compliance with initial HEP.  Baseline: Goal status: Met 04/21/2024  2.  Patient will endorse pain level no greater than 5/10 to show improved overall quality of life. Baseline:  Goal status: not met   LONG TERM GOALS: Target date: 05/31/2024  Patient will show compliance and independence with final HEP in order to maintain and progress upon functional gains made in PT. Baseline:  Goal status: on going 05/03/2024  2.  Patient will report pain levels no greater than 3/10 to show improved overall quality of life. Baseline:  Goal status: on going 05/03/2024  3.  Patient will increase PSFS to at least 7 in order to show significant improvement in self reported disability level.  Baseline:  Goal status: on going 05/03/2024  4.  Patient will increase  bilateral hip extension strength to at least 4+/5 in order to show improved biomechanics with functional mobility.  Baseline:  Goal status: on going 05/03/2024  5.  Patient will improve bilateral hip flexion AROM to at least 115deg to show improve functional mobility. Baseline:  Goal status: on going 05/03/2024    PLAN:  PT FREQUENCY: 1-2x/week  PT DURATION: 8 weeks  PLANNED INTERVENTIONS: 97164- PT Re-evaluation, 97750- Physical Performance Testing, 97110-Therapeutic exercises, 97530- Therapeutic activity, [id number]- Neuromuscular re-education, 97535- Self Care, [id number]- Manual therapy, [phone]- Gait training, [phone]- Electrical stimulation (unattended), [phone]- Electrical stimulation (manual), [id number]- Vasopneumatic device, [id number]- Ultrasound, [id number]- Traction (mechanical), [id number]- Ionotophoresis 4mg /ml Dexamethasone , 79439 (1-2 muscles), [id number] (3+ muscles)- Dry Needling, Patient/Family education, Balance training, Stair training, Taping, Joint mobilization, Joint  manipulation, Spinal manipulation, Spinal mobilization, Cryotherapy, and Moist heat  PLAN FOR NEXT SESSION:   has one more approved visit left from insurance   Ozell Silvan, PT, DPT, OCS, ATC 05/17/24  10:17 AM

## 2024-05-24 ENCOUNTER — Encounter: Payer: Self-pay | Admitting: Rehabilitative and Restorative Service Providers"

## 2024-05-24 ENCOUNTER — Ambulatory Visit: Admitting: Rehabilitative and Restorative Service Providers"

## 2024-05-24 DIAGNOSIS — M25562 Pain in left knee: Secondary | ICD-10-CM | POA: Diagnosis not present

## 2024-05-24 DIAGNOSIS — R2689 Other abnormalities of gait and mobility: Secondary | ICD-10-CM | POA: Diagnosis not present

## 2024-05-24 DIAGNOSIS — M6281 Muscle weakness (generalized): Secondary | ICD-10-CM | POA: Diagnosis not present

## 2024-05-24 DIAGNOSIS — M25662 Stiffness of left knee, not elsewhere classified: Secondary | ICD-10-CM

## 2024-05-24 DIAGNOSIS — M5459 Other low back pain: Secondary | ICD-10-CM

## 2024-05-24 NOTE — Therapy (Addendum)
 OUTPATIENT PHYSICAL THERAPY TREATMENT / DISCHARGE   Patient Name: Isaac Fletcher MRN: [id number] DOB:October 26, 1980, 43 y.o., male Today's Date: 05/24/2024  END OF SESSION:  PT End of Session - 05/24/24 0930     Visit Number 6    Number of Visits 16    Date for PT Re-Evaluation 05/31/24    Authorization Type [employer] $20 COPAY, 21V, AUTH NEEDED    Authorization Time Period 6 approved    Authorization - Visit Number 6    Authorization - Number of Visits 6    Progress Note Due on Visit 7    PT Start Time 0929    PT Stop Time 1009    PT Time Calculation (min) 40 min    Activity Tolerance Patient tolerated treatment well    Behavior During Therapy WFL for tasks assessed/performed               Past Medical History:  Diagnosis Date   Back pain    Constipation    H/O hernia repair    Past Surgical History:  Procedure Laterality Date   HERNIA REPAIR     Herniated Disc     LUMBAR LAMINECTOMY N/A 03/21/2021   Procedure: LEFT LEFT FOUR-FIVE MICRODISCECTOMY;  Surgeon: Barbarann Oneil BROCKS, MD;  Location: [facility] OR;  Service: Orthopedics;  Laterality: N/A;   LUMBAR LAMINECTOMY/DECOMPRESSION MICRODISCECTOMY N/A 02/16/2022   Procedure: LEFT FAR LATERAL APPROACH TO REMOVE LEFT [name] DISC HERNIATION;  Surgeon: Lucilla Lynwood FORBES, MD;  Location: [facility] OR;  Service: Orthopedics;  Laterality: N/A;   Patient Active Problem List   Diagnosis Date Noted   Status post lumbar laminectomy 02/16/2022   Status post lumbar microdiscectomy 04/08/2021   Surgery, elective    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  Pt indicated overall improvement around 60%.   Pt indicated still having difficulty with prolonged sitting, standing prolonged.   In person interpreter in visit.   PERTINENT HISTORY: Patient has undergone 2 surgeries on lower back, to include a lumbar laminectomy in 2022 and a lumbar decompression/laminectomy with microdiscectomy in 2023. Patient is currently having low back pain, Lt glute pain, and tingling in the shin/lower leg that intermittently feels like a cramping pain which makes him Lt LE feel heavy. Patient also believes that his legs are different lengths and that going up and down the stairs outside of the home make him feel like he's going to fall. Patient notes seeing a chiropractor.    PAIN:   NPRS scale:  no pain , tightness indicated  Pain location: Low back, Lt thigh/gluteals, Lt shin  Pain description: stiffness, cramping, pinching, nerve pain   Aggravating factors: standing for prolonged periods of time Relieving factors: lying down, sitting down, resting, tramadol , gabapentin    PRECAUTIONS: None  RED FLAGS: None   WEIGHT BEARING RESTRICTIONS: No  FALLS:  Has patient fallen in last 6 months? 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  DIAGNOSTIC FINDINGS: XRs of the lumbar spine from 11/25/2023 were independently reviewed and  interpreted, showing no significant degenerative changes.  Lordotic   alignment.  No evidence of instability on flexion/extension views.  No  fracture or dislocation seen.   PATIENT SURVEYS:  PSFS: THE PATIENT SPECIFIC FUNCTIONAL SCALE  Place score of 0-10 (0 = unable to perform activity and 10 = able to perform activity at the same level as before injury or problem)  Activity Date: 04/05/2024 05/24/2024   Walking for prolonged periods  5 7   2.  Stairs  5 6   3. Lift heavy  5 6   4.      Total Score 5 6.33     Total Score = Sum of activity scores/number of activities  Minimally Detectable Change: 3 points (for single activity); 2 points (for average score)   COGNITION: Eval Overall cognitive status: Within functional limits for tasks assessed     SENSATION: Eval Not tested  MUSCLE LENGTH: Eval Not tested on eval   POSTURE:  04/25/2024: Standing posture revealed Rt iliac lower than Lt.  Corrected with shoe insert provided.    Eval: No Significant postural limitations   PALPATION: 04/25/2024: Trigger points in Lt QL, Lt glute max/med.    Eval Not TTP with palpation to lumbar musculature, lumbar spine, sacrum, PSIS, and bilat glutes   LOWER EXTREMITY ROM:  ROM Right Eval 04/05/2024 Left Eval 04/05/2024 Left/Right 04/21/2024 Right 05/24/2024 Left 05/24/2024  Hip flexion 100/112; painful  98/108 95/95 110 AROM, 115 PROM no complaint 110 AROM, 120 PROM no complaint  Hip extension       Hip abduction       Hip adduction       Hip internal rotation   7/18    Hip external rotation   28/24    Knee flexion       Knee extension       Ankle dorsiflexion       Ankle plantarflexion       Ankle inversion       Ankle eversion       Hamstrings   45/45     (Blank rows = not tested)  LOWER EXTREMITY MMT:  MMT Right Eval 04/05/2024 Left Eval 04/05/2024 Right 05/24/2024 Left 05/24/2024  Hip flexion 5/5 4+/5 5/5 5/5  Hip extension 4/5 4/5 5/5 5/5  Hip abduction   4/5 4/5  Hip adduction      Hip internal rotation      Hip external  rotation      Knee flexion 5/5 5/5 5/5 5/5  Knee extension 5/5 5/5 5/5 5/5  Ankle dorsiflexion      Ankle plantarflexion      Ankle inversion      Ankle eversion       (Blank rows = not tested)  SPECIAL TESTS:  04/25/2024: Lt leg length in supine : 35.5 inch, Rt leg 35.25 inch.    Eval SLR: negative; hamstring tightness noted   FUNCTIONAL TESTS:  Not assessed this date   GAIT: Eval:  Distance walked: not formally assessed  Assistive device utilized: None Level of  assistance: Complete Independence Comments:    LUMBAR ROM:   ROM AROM  eval 04/21/2024 AROM 04/25/2024 AROM 05/03/2024   Flexion Can reach shins, painful      Extension WFL 10 degrees 50% WFL , no change in leg symptoms.  100% WFL without complaints.    Right lateral flexion WFL, painful      Left lateral flexion WFL, painful      Right rotation WFL, painful      Left rotation WFL       (Blank rows = not tested)                       TREATMENT         DATE: 05/24/2024 Therex: Recumbent bike   lvl 4 interval training for :15 seconds faster top of each minute. 10 mins total  Prone opposite arm /leg lift 3 sec hold x 10 bilateral  SKC 30 sec x 3 bilateral  Hooklying double leg bridge x 15, single leg figure 4 bridge x 10 bilateral  Lumbar trunk rotation supine 15 sec x 3 bilateral  Supine figure 4 push away stretch 30 sec x 3 bilateral    Manual: Percussive device to Lt QL, lumbar paraspinals.     TREATMENT         DATE: 05/17/2024 Therex: UBE LE only seat 13 for ROM, endurance with interval training faster :10 seconds at top of each minute.  10 mins total lvl 4.0  Seated quad set with SLR slowly with 1-2 pause in lift 2 x 15 bilateral  Supine thomas stretch Lt hip flexors with Rt knee pulled towards chest 15 sec x 5    Manual: Percussive device to Lt glute max/med, Lt quad, lateral thigh   TherActivity(to improve squatting, transfers, stairs, ambulation) Leg press double leg x 15, 75 lbs, x 15 87  lbs Leg press single leg 43 lbs 2 x 15 bilateral     TREATMENT         DATE: 05/03/2024 Manual Percussive device to Lt QL, Lt glute max/med, Lt quad, lateral thigh  Therex: Supine hook lying lumbar trunk rotation 15 sec x 3 bilateral  Supine figure 4 pull towards stretch 30 sec x 3 bilateral  Supine hook lying bridge 2 x 10  Sidelying clam shell  2 x 15 , performed bilaterally  Seated quad set with SLR x 15 bilateral    TherActivity(to improve squatting, transfers, stairs, ambulation) Leg press double leg x 15, 75 lbs Leg press single leg 37 lbs 2 x 15 bilateral    PATIENT EDUCATION:  05/24/2024 Education details: HEP update Person educated: Patient Education method: Programmer, Multimedia, Demonstration, Verbal cues, and Handouts Education comprehension: verbalized understanding, returned demonstration, and verbal cues required  HOME EXERCISE PROGRAM: Access Code: [email or web address] URL: [email or web address] Date: 05/24/2024 Prepared by: Ozell Silvan  Exercises - Supine Lower Trunk Rotation  - 1 x daily - 1 x weekly - 2-3 sets - 10 reps - 5s hold - Supine Bridge  - 2 x daily - 7 x weekly - 2 sets - 10 reps - 5 second hold - Supine Figure 4 Piriformis Stretch  - 2 x daily - 7 x weekly - 1 sets - 5 reps - 20 seconds hold - Single Knee to Chest Stretch  - 2 x daily - 7 x weekly - 1 sets - 5 reps - 20 seconds hold - Supine Hamstring Stretch  - 2 x daily - 7 x weekly -  1 sets - 5 reps - 20 seconds hold - Standing Lumbar Extension  - 2-4 x daily - 7 x weekly - 1 sets - 5-10 reps - Seated SLR  - 1-2 x daily - 7 x weekly - 1-2 sets - 10-15 reps - 2 hold - Prone Alternating Arm and Leg Lifts  - 1-2 x daily - 7 x weekly - 1-2 sets - 10 reps - 3 hold  ASSESSMENT:  CLINICAL IMPRESSION: Pt has continued complaints of tightness and some mobility deficits related to back and Lt proximal leg.  Functional weakness still indicated as well.   About 60% improvement reported.   Focus more on  lumbar mobility improvements today due to arrival symptoms.    OBJECTIVE IMPAIRMENTS: decreased activity tolerance, decreased mobility, difficulty walking, decreased ROM, decreased strength, hypomobility, impaired flexibility, and pain.   ACTIVITY LIMITATIONS: bending and standing  PARTICIPATION LIMITATIONS: community activity and occupation  PERSONAL FACTORS: Time since onset of injury/illness/exacerbation are also affecting patient's functional outcome.   REHAB POTENTIAL: Good  CLINICAL DECISION MAKING: Stable/uncomplicated  EVALUATION COMPLEXITY: Low   GOALS: Goals reviewed with patient? Yes  SHORT TERM GOALS: Target date: 04/26/2024 Patient will show compliance with initial HEP.  Baseline: Goal status: Met 04/21/2024  2.  Patient will endorse pain level no greater than 5/10 to show improved overall quality of life. Baseline:  Goal status: not met   LONG TERM GOALS: Target date: 05/31/2024  Patient will show compliance and independence with final HEP in order to maintain and progress upon functional gains made in PT. Baseline:  Goal status: on going 05/03/2024  2.  Patient will report pain levels no greater than 3/10 to show improved overall quality of life. Baseline:  Goal status: on going 05/03/2024  3.  Patient will increase PSFS to at least 7 in order to show significant improvement in self reported disability level.  Baseline:  Goal status: on going 05/03/2024  4.  Patient will increase  bilateral hip extension strength to at least 4+/5 in order to show improved biomechanics with functional mobility.  Baseline:  Goal status: on going 05/03/2024  5.  Patient will improve bilateral hip flexion AROM to at least 115deg to show improve functional mobility. Baseline:  Goal status: on going 05/03/2024    PLAN:  PT FREQUENCY: 1-2x/week  PT DURATION: 8 weeks  PLANNED INTERVENTIONS: 97164- PT Re-evaluation, 97750- Physical Performance Testing, 97110-Therapeutic  exercises, 97530- Therapeutic activity, 97112- Neuromuscular re-education, 97535- Self Care, [id number]- Manual therapy, [phone]- Gait training, [phone]- Electrical stimulation (unattended), [phone]- Electrical stimulation (manual), [id number]- Vasopneumatic device, [id number]- Ultrasound, [id number]- Traction (mechanical), [id number]- Ionotophoresis 4mg /ml Dexamethasone , 79439 (1-2 muscles), [id number] (3+ muscles)- Dry Needling, Patient/Family education, Balance training, Stair training, Taping, Joint mobilization, Joint manipulation, Spinal manipulation, Spinal mobilization, Cryotherapy, and Moist heat  PLAN FOR NEXT SESSION:   Recert/PN with LTG updates and print out for new approval from [employer] (majority of objective data was taken today)  Ozell Silvan, PT, DPT, OCS, ATC 05/24/24  10:09 AM  PHYSICAL THERAPY DISCHARGE SUMMARY  Visits from Start of Care: 6  Current functional level related to goals / functional outcomes: See note   Remaining deficits: See note   Education / Equipment: HEP  Patient goals were partially met. Patient is being discharged due to not returning since the last visit.  Ozell Silvan, PT, DPT, OCS, ATC 07/18/24  1:10 PM

## 2024-05-30 ENCOUNTER — Encounter: Attending: Physical Medicine & Rehabilitation | Admitting: Physical Medicine & Rehabilitation

## 2024-05-30 NOTE — Therapy (Incomplete)
 OUTPATIENT PHYSICAL THERAPY TREATMENT/RECERT   Patient Name: Isaac Fletcher MRN: [id number] DOB:1981-07-24, 43 y.o., male Today's Date: 05/30/2024  END OF SESSION:***Rec         Past Medical History:  Diagnosis Date   Back pain    Constipation    H/O hernia repair    Past Surgical History:  Procedure Laterality Date   HERNIA REPAIR     Herniated Disc     LUMBAR LAMINECTOMY N/A 03/21/2021   Procedure: LEFT LEFT FOUR-FIVE MICRODISCECTOMY;  Surgeon: Barbarann Oneil BROCKS, MD;  Location: [facility] OR;  Service: Orthopedics;  Laterality: N/A;   LUMBAR LAMINECTOMY/DECOMPRESSION MICRODISCECTOMY N/A 02/16/2022   Procedure: LEFT FAR LATERAL APPROACH TO REMOVE LEFT [name] DISC HERNIATION;  Surgeon: Lucilla Lynwood FORBES, MD;  Location: [facility] OR;  Service: Orthopedics;  Laterality: N/A;   Patient Active Problem List   Diagnosis Date Noted   Status post lumbar laminectomy 02/16/2022   Status post lumbar microdiscectomy 04/08/2021   Surgery, elective    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  Rationale for Evaluation and Treatment: Rehabilitation  ONSET DATE: since prior to back surgeries (2022, 2023)   SUBJECTIVE:   SUBJECTIVE STATEMENT: Pt indicated tightness continued in lower back and Lt leg.  Pt indicated overall improvement around 60%.   Pt indicated still having difficulty with prolonged sitting, standing prolonged.   In person interpreter in visit.   PERTINENT HISTORY: Patient has undergone 2 surgeries on lower back, to include a lumbar laminectomy in 2022 and a lumbar decompression/laminectomy with microdiscectomy in 2023. Patient is currently having low back pain, Lt glute pain, and tingling in the shin/lower leg that intermittently feels  like a cramping pain which makes him Lt LE feel heavy. Patient also believes that his legs are different lengths and that going up and down the stairs outside of the home make him feel like he's going to fall. Patient notes seeing a chiropractor.    PAIN:   NPRS scale:  no pain , tightness indicated  Pain location: Low back, Lt thigh/gluteals, Lt shin  Pain description: stiffness, cramping, pinching, nerve pain   Aggravating factors: standing for prolonged periods of time Relieving factors: lying down, sitting down, resting, tramadol , gabapentin    PRECAUTIONS: None  RED FLAGS: None   WEIGHT BEARING RESTRICTIONS: No  FALLS:  Has patient fallen in last 6 months? 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  DIAGNOSTIC FINDINGS: XRs of the lumbar spine from 11/25/2023 were independently reviewed and  interpreted, showing no significant degenerative changes.  Lordotic  alignment.  No evidence of instability on flexion/extension views.  No  fracture or dislocation seen.   PATIENT SURVEYS:  PSFS: THE PATIENT SPECIFIC FUNCTIONAL SCALE  Place score of 0-10 (0 = unable to perform activity and 10 = able to perform activity at the same level as before injury or problem)  Activity Date: 04/05/2024 05/24/2024   Walking for prolonged periods  5 7   2.  Stairs  5 6   3. Lift heavy  5 6   4.      Total Score 5 6.33     Total Score = Sum of activity scores/number of activities  Minimally Detectable Change: 3 points (for single activity); 2 points (for average score)   COGNITION: Eval Overall cognitive status: Within functional limits for  tasks assessed     SENSATION: Eval Not tested  MUSCLE LENGTH: Eval Not tested on eval   POSTURE:  04/25/2024: Standing posture revealed Rt iliac lower than Lt.  Corrected with shoe insert provided.    Eval: No Significant postural limitations   PALPATION: 04/25/2024: Trigger points in Lt QL, Lt glute max/med.    Eval Not TTP with palpation to lumbar musculature, lumbar spine, sacrum, PSIS, and bilat glutes   LOWER EXTREMITY ROM:  ROM Right Eval 04/05/2024 Left Eval 04/05/2024 Left/Right 04/21/2024 Right 05/24/2024 Left 05/24/2024  Hip flexion 100/112; painful  98/108 95/95 110 AROM, 115 PROM no complaint 110 AROM, 120 PROM no complaint  Hip extension       Hip abduction       Hip adduction       Hip internal rotation   7/18    Hip external rotation   28/24    Knee flexion       Knee extension       Ankle dorsiflexion       Ankle plantarflexion       Ankle inversion       Ankle eversion       Hamstrings   45/45     (Blank rows = not tested)  LOWER EXTREMITY MMT:  MMT Right Eval 04/05/2024 Left Eval 04/05/2024 Right 05/24/2024 Left 05/24/2024  Hip flexion 5/5 4+/5 5/5 5/5  Hip extension 4/5 4/5 5/5 5/5  Hip abduction   4/5 4/5  Hip adduction      Hip internal rotation      Hip external rotation      Knee flexion 5/5 5/5 5/5 5/5  Knee extension 5/5 5/5 5/5 5/5  Ankle dorsiflexion      Ankle plantarflexion      Ankle inversion      Ankle eversion       (Blank rows = not tested)  SPECIAL TESTS:  04/25/2024: Lt leg length in supine : 35.5 inch, Rt leg 35.25 inch.    Eval SLR: negative; hamstring tightness noted   FUNCTIONAL TESTS:  Not assessed this date   GAIT: Eval:  Distance walked: not formally assessed  Assistive device utilized: None Level of assistance: Complete Independence Comments:    LUMBAR ROM:   ROM AROM  eval 04/21/2024 AROM 04/25/2024 AROM 05/03/2024   Flexion Can reach shins, painful      Extension WFL 10 degrees 50% WFL ,  no change in leg symptoms.  100% WFL without complaints.    Right lateral flexion WFL, painful      Left lateral flexion WFL, painful      Right rotation WFL, painful      Left rotation WFL       (Blank rows = not tested)                       TREATMENT         DATE:  05/31/24***    05/24/2024 Therex: Recumbent bike   lvl 4 interval training for :15 seconds faster top of each minute. 10  mins total  Prone opposite arm /leg lift 3 sec hold x 10 bilateral  SKC 30 sec x 3 bilateral  Hooklying double leg bridge x 15, single leg figure 4 bridge x 10 bilateral  Lumbar trunk rotation supine 15 sec x 3 bilateral  Supine figure 4 push away stretch 30 sec x 3 bilateral    Manual: Percussive device to Lt QL, lumbar paraspinals.     TREATMENT         DATE: 05/17/2024 Therex: UBE LE only seat 13 for ROM, endurance with interval training faster :10 seconds at top of each minute.  10 mins total lvl 4.0  Seated quad set with SLR slowly with 1-2 pause in lift 2 x 15 bilateral  Supine thomas stretch Lt hip flexors with Rt knee pulled towards chest 15 sec x 5    Manual: Percussive device to Lt glute max/med, Lt quad, lateral thigh   TherActivity(to improve squatting, transfers, stairs, ambulation) Leg press double leg x 15, 75 lbs, x 15 87 lbs Leg press single leg 43 lbs 2 x 15 bilateral     TREATMENT         DATE: 05/03/2024 Manual Percussive device to Lt QL, Lt glute max/med, Lt quad, lateral thigh  Therex: Supine hook lying lumbar trunk rotation 15 sec x 3 bilateral  Supine figure 4 pull towards stretch 30 sec x 3 bilateral  Supine hook lying bridge 2 x 10  Sidelying clam shell  2 x 15 , performed bilaterally  Seated quad set with SLR x 15 bilateral    TherActivity(to improve squatting, transfers, stairs, ambulation) Leg press double leg x 15, 75 lbs Leg press single leg 37 lbs 2 x 15 bilateral    PATIENT EDUCATION:  05/24/2024 Education details: HEP update Person  educated: Patient Education method: Programmer, multimedia, Demonstration, Verbal cues, and Handouts Education comprehension: verbalized understanding, returned demonstration, and verbal cues required  HOME EXERCISE PROGRAM: Access Code: [email or web address] URL: [email or web address] Date: 05/24/2024 Prepared by: Ozell Silvan  Exercises - Supine Lower Trunk Rotation  - 1 x daily - 1 x weekly - 2-3 sets - 10 reps - 5s hold - Supine Bridge  - 2 x daily - 7 x weekly - 2 sets - 10 reps - 5 second hold - Supine Figure 4 Piriformis Stretch  - 2 x daily - 7 x weekly - 1 sets - 5 reps - 20 seconds hold - Single Knee to Chest Stretch  - 2 x daily - 7 x weekly - 1 sets - 5 reps - 20 seconds hold - Supine Hamstring Stretch  - 2 x daily - 7 x weekly - 1 sets - 5 reps - 20 seconds hold - Standing Lumbar Extension  - 2-4 x daily - 7 x weekly - 1 sets - 5-10 reps - Seated SLR  - 1-2 x daily - 7 x weekly - 1-2 sets - 10-15 reps - 2 hold - Prone Alternating Arm and Leg Lifts  - 1-2 x daily - 7 x weekly - 1-2 sets - 10 reps - 3 hold  ASSESSMENT:  CLINICAL IMPRESSION: ***Pt has had 7 PT visits and progressed ***continued complaints of tightness and some mobility deficits related to back and Lt proximal leg.  Functional weakness still indicated as well.   About 60% improvement reported.   Focus more on lumbar mobility improvements today due to arrival symptoms.  ***He would benefit from continued skilled PT to achieve LTG.   OBJECTIVE IMPAIRMENTS: decreased activity tolerance,  decreased mobility, difficulty walking, decreased ROM, decreased strength, hypomobility, impaired flexibility, and pain.   ACTIVITY LIMITATIONS: bending and standing  PARTICIPATION LIMITATIONS: community activity and occupation  PERSONAL FACTORS: Time since onset of injury/illness/exacerbation are also affecting patient's functional outcome.   REHAB POTENTIAL: Good  CLINICAL DECISION MAKING: Stable/uncomplicated  EVALUATION  COMPLEXITY: Low   GOALS: Goals reviewed with patient? Yes  SHORT TERM GOALS: Target date: 04/26/2024 Patient will show compliance with initial HEP.  Baseline: Goal status: Met 04/21/2024  2.  Patient will endorse pain level no greater than 5/10 to show improved overall quality of life. Baseline:  Goal status: not met***   LONG TERM GOALS: Target date: 05/31/2024****  Patient will show compliance and independence with final HEP in order to maintain and progress upon functional gains made in PT. Baseline:  Goal status: on going 05/03/2024  2.  Patient will report pain levels no greater than 3/10 to show improved overall quality of life. Baseline:  Goal status: on going 05/03/2024  3.  Patient will increase PSFS to at least 7 in order to show significant improvement in self reported disability level.  Baseline:  Goal status: on going 05/03/2024  4.  Patient will increase  bilateral hip extension strength to at least 4+/5 in order to show improved biomechanics with functional mobility.  Baseline:  Goal status: on going 05/03/2024  5.  Patient will improve bilateral hip flexion AROM to at least 115deg to show improve functional mobility. Baseline:  Goal status: on going 05/03/2024    PLAN:  PT FREQUENCY: 1-2x/week  PT DURATION: 8 weeks  PLANNED INTERVENTIONS: 97164- PT Re-evaluation, 97750- Physical Performance Testing, 97110-Therapeutic exercises, 97530- Therapeutic activity, [id number]- Neuromuscular re-education, 97535- Self Care, [id number]- Manual therapy, [phone]- Gait training, [phone]- Electrical stimulation (unattended), [phone]- Electrical stimulation (manual), [id number]- Vasopneumatic device, [id number]- Ultrasound, [id number]- Traction (mechanical), [id number]- Ionotophoresis 4mg /ml Dexamethasone , 79439 (1-2 muscles), [id number] (3+ muscles)- Dry Needling, Patient/Family education, Balance training, Stair training, Taping, Joint mobilization, Joint manipulation, Spinal manipulation, Spinal mobilization,  Cryotherapy, and Moist heat  PLAN FOR NEXT SESSION:   ***Recert/PN with LTG updates and print out for new approval from [employer] (majority of objective data was taken today)  Burnard Meth, PT 05/30/24  1:20 PM

## 2024-05-31 ENCOUNTER — Encounter

## 2024-06-07 ENCOUNTER — Encounter: Admitting: Rehabilitative and Restorative Service Providers"

## 2024-07-07 ENCOUNTER — Encounter: Payer: Self-pay | Admitting: Physician Assistant

## 2024-07-07 ENCOUNTER — Ambulatory Visit: Admitting: Physician Assistant

## 2024-07-07 DIAGNOSIS — M5442 Lumbago with sciatica, left side: Secondary | ICD-10-CM

## 2024-07-07 DIAGNOSIS — M5441 Lumbago with sciatica, right side: Secondary | ICD-10-CM

## 2024-07-07 DIAGNOSIS — G8929 Other chronic pain: Secondary | ICD-10-CM

## 2024-07-07 MED ORDER — METHYLPREDNISOLONE 4 MG PO TBPK: ORAL_TABLET | ORAL | 0 refills | Status: AC

## 2024-07-07 MED ORDER — CYCLOBENZAPRINE HCL 10 MG PO TABS: 10.0000 mg | ORAL_TABLET | Freq: Three times a day (TID) | ORAL | 0 refills | Status: AC | PRN

## 2024-07-07 NOTE — Progress Notes (Signed)
 Office Visit Note   Patient: Isaac Fletcher           Date of Birth: [birth year]           MRN: [id number] Visit Date: 07/07/2024              Requested by: No referring provider defined for this encounter. PCP: Pcp, No  Chief Complaint  Patient presents with   Left Leg - Pain      HPI: Patient is a pleasant 43 year old gentleman accompanied by French interpreter.  He has had a history of low back surgeries x 2 with Dr. Lucilla.  He continues to complain of left sided low back tightness more than pain.  He did see Dr. Georgina for this over the summer who placed him in physical therapy.  He also is being seen by pain management.  He has had no new injuries denies any loss of bowel or bladder control  Assessment & Plan: Visit Diagnoses:  1. Chronic bilateral low back pain with sciatica, sciatica laterality unspecified     Plan: Had a long discussion with the patient today these are very similar problems he was having before.  He is currently in pain management.  I will give him a small amount of muscle relaxant and try a steroid taper.  Instructed him not to use any other anti-inflammatories while he is on this.  Follow-Up Instructions: With Dr. Georgina Beers Exam  Patient is alert, oriented, no adenopathy, well-dressed, normal affect, normal respiratory effort. Examination of his low back well-healed surgical incision.  He has good flexion extension side-to-side bending negative straight leg raise strength is 5 out of 5.  Sensation is intact.  Does have some muscle tightness in the paravertebral muscles.  No particular pain in the knee today.    Imaging: No results found. No images are attached to the encounter.  Labs: No results found for: HGBA1C, ESRSEDRATE, CRP, LABURIC, REPTSTATUS, GRAMSTAIN, CULT, LABORGA   Lab Results  Component Value Date   ALBUMIN 3.9 03/21/2021    No results found for: MG No results found for: VD25OH  No results found for:  PREALBUMIN    Latest Ref Rng & Units 02/11/2022   10:00 AM 03/21/2021    5:48 AM  CBC EXTENDED  WBC 4.0 - 10.5 K/uL 6.7  6.4   RBC 4.22 - 5.81 MIL/uL 6.44  6.27   Hemoglobin 13.0 - 17.0 g/dL 85.0  85.3   HCT 60.9 - 52.0 % 48.7  47.6   Platelets 150 - 400 K/uL 259  249      There is no height or weight on file to calculate BMI.  Orders:  No orders of the defined types were placed in this encounter.  No orders of the defined types were placed in this encounter.    Procedures: No procedures performed  Clinical Data: No additional findings.  ROS:  All other systems negative, except as noted in the HPI. Review of Systems  Objective: Vital Signs: There were no vitals taken for this visit.  Specialty Comments:  MRI LUMBAR SPINE WITHOUT AND WITH CONTRAST     TECHNIQUE:  Multiplanar and multiecho pulse sequences of the lumbar spine were  obtained without and with intravenous contrast.     CONTRAST:  18mL MULTIHANCE  GADOBENATE DIMEGLUMINE  529 MG/ML IV SOLN     COMPARISON:  MRI of the lumbar spine November 14, 2020.     FINDINGS:  Segmentation: Standard segmentation is seen. The inferior-most  fully  formed intervertebral disc is labeled L5-S1. Same numbering as on  the prior MRI.     Alignment: Straightening of the normal lumbar lordosis. No  substantial sagittal subluxation.     Vertebrae: Congenitally short pedicles. Vertebral body heights are  maintained. No focal marrow edema to suggest acute fracture or  discitis/osteomyelitis.     Conus medullaris and cauda equina: Conus extends to the L2 level.  Conus and cauda equina appear normal. No abnormal enhancement of the  conus or cauda equina.     Paraspinal and other soft tissues: Postoperative changes in the  lower left lumbar spine. Otherwise, unremarkable.     Disc levels:     T12-L1: No significant disc protrusion, foraminal stenosis, or canal  stenosis.     L1-L2: No significant disc protrusion, foraminal  stenosis, or canal  stenosis.     L2-L3: No significant disc protrusion, foraminal stenosis, or canal  stenosis.     L3-L4: No significant disc protrusion, foraminal stenosis, or canal  stenosis.     L4-L5: Disc height loss and desiccation. Broad disc bulge. Enhancing  scar tissue in the posterolateral left canal and left foramen,  compatible with sequela interval micro discectomy. This scar tissue  limits assessment; however, left foraminal stenosis is likely  improved without evidence of recurrent disc herniation. No  significant canal stenosis. Similar moderate right foraminal  stenosis.     L5-S1: Mild disc bulging with moderate right and mild left foraminal  stenosis. No significant canal stenosis.     IMPRESSION:  1. Interval left L4-L5 micro discectomy. Scar tissue in the left  foramen and lateral canal limits assessment, but suspect improved  foraminal stenosis without evidence of recurrent herniation. Similar  moderate right foraminal stenosis at this level.  2. At L5-S1, similar moderate right and mild left foraminal  stenosis.        Electronically Signed    By: Gilmore GORMAN Molt M.D.    On: 07/07/2021 12:07  PMFS History: Patient Active Problem List   Diagnosis Date Noted   Status post lumbar laminectomy 02/16/2022   Status post lumbar microdiscectomy 04/08/2021   Surgery, elective    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   Lumbar foraminal stenosis 02/19/2021   Trochanteric bursitis, left hip 02/19/2021   Past Medical History:  Diagnosis Date   Back pain    Constipation    H/O hernia repair     Family History  Problem Relation Age of Onset   Diabetes Father     Past Surgical History:  Procedure Laterality Date   HERNIA REPAIR     Herniated Disc     LUMBAR LAMINECTOMY N/A 03/21/2021   Procedure: LEFT LEFT FOUR-FIVE MICRODISCECTOMY;  Surgeon: Barbarann Oneil BROCKS, MD;  Location: [facility] OR;  Service: Orthopedics;  Laterality: N/A;   LUMBAR  LAMINECTOMY/DECOMPRESSION MICRODISCECTOMY N/A 02/16/2022   Procedure: LEFT FAR LATERAL APPROACH TO REMOVE LEFT [name] DISC HERNIATION;  Surgeon: Lucilla Lynwood BRAVO, MD;  Location: [facility] OR;  Service: Orthopedics;  Laterality: N/A;   Social History   Occupational History   Not on file  Tobacco Use   Smoking status: Never   Smokeless tobacco: Never  Vaping Use   Vaping status: Never Used  Substance and Sexual Activity   Alcohol use: Yes   Drug use: Never   Sexual activity: Not on file

## 2024-07-10 ENCOUNTER — Encounter: Payer: Self-pay | Admitting: Radiology

## 2024-08-14 ENCOUNTER — Ambulatory Visit: Admitting: Orthopedic Surgery
